# Patient Record
Sex: Female | Born: 1950
Health system: Southern US, Community
[De-identification: ages and names within clinical notes are randomized; demographics above are authoritative.]

## PROBLEM LIST (undated history)

## (undated) DIAGNOSIS — N200 Calculus of kidney: Secondary | ICD-10-CM

## (undated) DIAGNOSIS — T7840XA Allergy, unspecified, initial encounter: Secondary | ICD-10-CM

## (undated) DIAGNOSIS — M8580 Other specified disorders of bone density and structure, unspecified site: Secondary | ICD-10-CM

## (undated) DIAGNOSIS — G4700 Insomnia, unspecified: Secondary | ICD-10-CM

## (undated) DIAGNOSIS — F419 Anxiety disorder, unspecified: Secondary | ICD-10-CM

## (undated) DIAGNOSIS — Z9889 Other specified postprocedural states: Secondary | ICD-10-CM

## (undated) DIAGNOSIS — C4491 Basal cell carcinoma of skin, unspecified: Secondary | ICD-10-CM

## (undated) DIAGNOSIS — K219 Gastro-esophageal reflux disease without esophagitis: Secondary | ICD-10-CM

## (undated) DIAGNOSIS — G4730 Sleep apnea, unspecified: Secondary | ICD-10-CM

## (undated) DIAGNOSIS — N840 Polyp of corpus uteri: Secondary | ICD-10-CM

## (undated) DIAGNOSIS — Z87442 Personal history of urinary calculi: Secondary | ICD-10-CM

## (undated) DIAGNOSIS — E039 Hypothyroidism, unspecified: Secondary | ICD-10-CM

## (undated) DIAGNOSIS — E7800 Pure hypercholesterolemia, unspecified: Secondary | ICD-10-CM

## (undated) DIAGNOSIS — Z780 Asymptomatic menopausal state: Secondary | ICD-10-CM

## (undated) DIAGNOSIS — F988 Other specified behavioral and emotional disorders with onset usually occurring in childhood and adolescence: Secondary | ICD-10-CM

## (undated) DIAGNOSIS — M47812 Spondylosis without myelopathy or radiculopathy, cervical region: Secondary | ICD-10-CM

## (undated) DIAGNOSIS — R112 Nausea with vomiting, unspecified: Secondary | ICD-10-CM

## (undated) HISTORY — DX: Hypothyroidism, unspecified: E03.9

## (undated) HISTORY — DX: Spondylosis without myelopathy or radiculopathy, cervical region: M47.812

## (undated) HISTORY — DX: Calculus of kidney: N20.0

## (undated) HISTORY — DX: Pure hypercholesterolemia, unspecified: E78.00

## (undated) HISTORY — PX: CATARACT EXTRACTION: SUR2

## (undated) HISTORY — PX: COLONOSCOPY: SHX174

## (undated) HISTORY — DX: Other specified behavioral and emotional disorders with onset usually occurring in childhood and adolescence: F98.8

## (undated) HISTORY — DX: Polyp of corpus uteri: N84.0

## (undated) HISTORY — DX: Insomnia, unspecified: G47.00

## (undated) HISTORY — PX: TONSILLECTOMY AND ADENOIDECTOMY: SHX28

## (undated) HISTORY — DX: Basal cell carcinoma of skin, unspecified: C44.91

## (undated) HISTORY — DX: Asymptomatic menopausal state: Z78.0

## (undated) HISTORY — DX: Allergy, unspecified, initial encounter: T78.40XA

## (undated) HISTORY — DX: Other specified disorders of bone density and structure, unspecified site: M85.80

## (undated) HISTORY — DX: Personal history of urinary calculi: Z87.442

---

## 1989-07-02 HISTORY — PX: TUBAL LIGATION: SHX77

## 1991-07-03 HISTORY — PX: HYMENECTOMY: SHX987

## 1999-08-21 ENCOUNTER — Other Ambulatory Visit: Admission: RE | Admit: 1999-08-21 | Discharge: 1999-08-21 | Payer: Self-pay | Admitting: Obstetrics and Gynecology

## 2000-07-10 ENCOUNTER — Ambulatory Visit (HOSPITAL_COMMUNITY)
Admission: RE | Admit: 2000-07-10 | Discharge: 2000-07-10 | Payer: Self-pay | Admitting: Orthodontics and Dentofacial Orthopedics

## 2000-07-10 ENCOUNTER — Encounter: Payer: Self-pay | Admitting: Orthodontics and Dentofacial Orthopedics

## 2000-08-27 ENCOUNTER — Other Ambulatory Visit: Admission: RE | Admit: 2000-08-27 | Discharge: 2000-08-27 | Payer: Self-pay | Admitting: Obstetrics and Gynecology

## 2001-08-02 LAB — HM COLONOSCOPY

## 2001-08-13 ENCOUNTER — Ambulatory Visit (HOSPITAL_COMMUNITY): Admission: RE | Admit: 2001-08-13 | Discharge: 2001-08-13 | Payer: Self-pay | Admitting: *Deleted

## 2001-09-10 ENCOUNTER — Encounter: Payer: Self-pay | Admitting: *Deleted

## 2001-09-10 ENCOUNTER — Encounter: Admission: RE | Admit: 2001-09-10 | Discharge: 2001-09-10 | Payer: Self-pay | Admitting: *Deleted

## 2002-01-06 ENCOUNTER — Other Ambulatory Visit: Admission: RE | Admit: 2002-01-06 | Discharge: 2002-01-06 | Payer: Self-pay | Admitting: Obstetrics and Gynecology

## 2002-03-24 ENCOUNTER — Encounter: Admission: RE | Admit: 2002-03-24 | Discharge: 2002-03-24 | Payer: Self-pay

## 2003-02-01 ENCOUNTER — Other Ambulatory Visit: Admission: RE | Admit: 2003-02-01 | Discharge: 2003-02-01 | Payer: Self-pay | Admitting: Obstetrics and Gynecology

## 2003-12-28 ENCOUNTER — Encounter: Admission: RE | Admit: 2003-12-28 | Discharge: 2003-12-28 | Payer: Self-pay | Admitting: Family Medicine

## 2005-02-22 ENCOUNTER — Encounter: Admission: RE | Admit: 2005-02-22 | Discharge: 2005-02-22 | Payer: Self-pay | Admitting: Family Medicine

## 2009-11-30 LAB — HM DEXA SCAN

## 2010-02-20 LAB — HM MAMMOGRAPHY: HM Mammogram: NEGATIVE

## 2010-10-04 ENCOUNTER — Institutional Professional Consult (permissible substitution) (INDEPENDENT_AMBULATORY_CARE_PROVIDER_SITE_OTHER): Payer: BC Managed Care – PPO | Admitting: Family Medicine

## 2010-10-04 DIAGNOSIS — F988 Other specified behavioral and emotional disorders with onset usually occurring in childhood and adolescence: Secondary | ICD-10-CM

## 2010-10-04 DIAGNOSIS — J309 Allergic rhinitis, unspecified: Secondary | ICD-10-CM

## 2010-12-18 ENCOUNTER — Telehealth: Payer: Self-pay | Admitting: Family Medicine

## 2010-12-18 MED ORDER — AMPHETAMINE-DEXTROAMPHET ER 20 MG PO CP24: 20.0000 mg | ORAL_CAPSULE | ORAL | Status: DC

## 2010-12-18 NOTE — Telephone Encounter (Signed)
Pls mail.

## 2010-12-18 NOTE — Telephone Encounter (Signed)
Addended byJoselyn Arrow on: 12/18/2010 05:04 PM   Modules accepted: Orders

## 2010-12-18 NOTE — Telephone Encounter (Signed)
Chart reviewed--last sent 5/15.  Please mail printed Rx

## 2010-12-18 NOTE — Telephone Encounter (Signed)
Left message informing pt that i placed adderall rx in the mail.

## 2010-12-31 LAB — HM PAP SMEAR

## 2011-01-17 ENCOUNTER — Telehealth: Payer: Self-pay | Admitting: Family Medicine

## 2011-01-17 MED ORDER — AMPHETAMINE-DEXTROAMPHET ER 20 MG PO CP24: 20.0000 mg | ORAL_CAPSULE | ORAL | Status: DC

## 2011-01-17 NOTE — Telephone Encounter (Signed)
Rx printed and signed--please verify that it is for Adderall, NOT ritalin. Thanks

## 2011-01-18 NOTE — Telephone Encounter (Signed)
HANDLE BY V

## 2011-01-18 NOTE — Telephone Encounter (Signed)
Mailed rx for adderall to patient.

## 2011-02-15 ENCOUNTER — Telehealth: Payer: Self-pay | Admitting: Family Medicine

## 2011-02-15 MED ORDER — AMPHETAMINE-DEXTROAMPHET ER 20 MG PO CP24: 20.0000 mg | ORAL_CAPSULE | ORAL | Status: DC

## 2011-02-15 NOTE — Telephone Encounter (Signed)
Printed/signed.  Please mail

## 2011-03-21 ENCOUNTER — Other Ambulatory Visit: Payer: Self-pay | Admitting: Family Medicine

## 2011-03-21 DIAGNOSIS — F988 Other specified behavioral and emotional disorders with onset usually occurring in childhood and adolescence: Secondary | ICD-10-CM

## 2011-03-21 MED ORDER — AMPHETAMINE-DEXTROAMPHET ER 20 MG PO CP24: 20.0000 mg | ORAL_CAPSULE | ORAL | Status: DC

## 2011-03-21 NOTE — Progress Notes (Signed)
Will need 6 month OV scheduled for before further refills.  Due in October.  Please mail Rx to pt per her request.

## 2011-04-20 ENCOUNTER — Encounter: Payer: Self-pay | Admitting: Family Medicine

## 2011-04-23 ENCOUNTER — Ambulatory Visit (INDEPENDENT_AMBULATORY_CARE_PROVIDER_SITE_OTHER): Payer: BC Managed Care – PPO | Admitting: Family Medicine

## 2011-04-23 ENCOUNTER — Encounter: Payer: Self-pay | Admitting: Family Medicine

## 2011-04-23 VITALS — BP 100/64 | HR 76 | Ht 59.0 in | Wt 104.0 lb

## 2011-04-23 DIAGNOSIS — E7800 Pure hypercholesterolemia, unspecified: Secondary | ICD-10-CM

## 2011-04-23 DIAGNOSIS — Z0000 Encounter for general adult medical examination without abnormal findings: Secondary | ICD-10-CM

## 2011-04-23 DIAGNOSIS — Z23 Encounter for immunization: Secondary | ICD-10-CM

## 2011-04-23 DIAGNOSIS — F988 Other specified behavioral and emotional disorders with onset usually occurring in childhood and adolescence: Secondary | ICD-10-CM

## 2011-04-23 DIAGNOSIS — R319 Hematuria, unspecified: Secondary | ICD-10-CM

## 2011-04-23 LAB — POCT URINALYSIS DIPSTICK
Glucose, UA: NEGATIVE
Nitrite, UA: NEGATIVE
Urobilinogen, UA: NEGATIVE
pH, UA: 5

## 2011-04-23 LAB — POCT UA - MICROSCOPIC ONLY

## 2011-04-23 MED ORDER — AMPHETAMINE-DEXTROAMPHET ER 20 MG PO CP24: 20.0000 mg | ORAL_CAPSULE | ORAL | Status: DC

## 2011-04-23 NOTE — Progress Notes (Signed)
Nicole Allen is a 60 y.o. female who presents for a complete physical.  She has the following concerns: Needs refill on her Adderall.  She has been taking it less frequently now that she is retired, not needing it every day, but can definitely tell when she isn't taking it. Hyperlipidemia follow-up.  Compliant with meds, denies side effects.  Due for labs today.  Immunization History  Administered Date(s) Administered  . Tdap 12/26/2005   Last Pap smear: 12/2010 Last mammogram: due now Last colonoscopy: 2/03 (Dr. Loreta Ave). Old record said repeat due 2008; pt recalls being told 10 yrs.  Colonoscopy report isn't in old records Last DEXA: every 2 years, last 11/2009 (through GYN's office) Ophtho: 3 weeks ago Dentist: every 6 months Exercise: 3-5 times/week ([facility]  Past Medical History  Diagnosis Date  . Allergy     SEASONAL  . ADD (attention deficit disorder)   . Postmenopausal     on HRT--sees GYN  . Kidney stone     x3, Dr. Wanda Plump  . Pure hypercholesterolemia   . Osteopenia   . BCC (basal cell carcinoma of skin)     chest; Dr. Danella Deis  . Cervical spondylosis   . Insomnia     worse with menopause  . Hearing loss     high frequency (Dr. Lenard Forth)    Past Surgical History  Procedure Date  . Tonsillectomy and adenoidectomy   . Hymenectomy 1993  . Cesarean section [id number]  . Tubal ligation 1991    History   Social History  . Marital Status: Married    Spouse Name: N/A    Number of Children: 2  . Years of Education: N/A   Occupational History  . retired Social research officer, government    Social History Main Topics  . Smoking status: Never Smoker   . Smokeless tobacco: Never Used  . Alcohol Use: Yes     3-5 drinks per week.  . Drug Use: No  . Sexually Active: Yes -- Female partner(s)   Other Topics Concern  . Not on file   Social History Narrative   Lives with husband.  Son in [place], [place] in [place], Daughter in college    Family History  Problem Relation  Age of Onset  . Asthma Mother   . Arthritis Mother   . Hypertension Mother   . Hyperlipidemia Mother   . Hypertension Father   . Heart disease Father     78's  . Hyperlipidemia Father   . Alzheimer's disease Maternal Grandmother   . Cancer Maternal Grandfather     lung  . Cancer Paternal Grandfather     lung    Current outpatient prescriptions:amphetamine-dextroamphetamine (ADDERALL XR) 20 MG 24 hr capsule, Take 1 capsule (20 mg total) by mouth every morning., Disp: 30 capsule, Rfl: 0;  Calcium Carbonate-Vitamin D (CALCIUM + D PO), Take 1 tablet by mouth daily.  , Disp: , Rfl: ;  fluticasone (FLONASE) 50 MCG/ACT nasal spray, Place 2 sprays into the nose daily.  , Disp: , Rfl:  loratadine (CLARITIN) 10 MG tablet, Take 10 mg by mouth daily.  , Disp: , Rfl: ;  Multiple Vitamins-Minerals (MULTIVITAMIN WITH MINERALS) tablet, Take 1 tablet by mouth daily.  , Disp: , Rfl: ;  progesterone (PROMETRIUM) 100 MG capsule, Take 100 mg by mouth daily.  , Disp: , Rfl: ;  simvastatin (ZOCOR) 10 MG tablet, Take 10 mg by mouth at bedtime.  , Disp: , Rfl: ;  VAGIFEM 10 MCG TABS,  Place 1 tablet vaginally 2 (two) times a week. , Disp: , Rfl:  VIVELLE-DOT 0.025 MG/24HR, Place 1 patch onto the skin once a week. , Disp: , Rfl: ;  amphetamine-dextroamphetamine (ADDERALL XR) 20 MG 24 hr capsule, Take 1 capsule (20 mg total) by mouth every morning., Disp: 30 capsule, Rfl: 0  Allergies  Allergen Reactions  . Morphine And Related Other (See Comments)    Family history.  . Codeine Itching and Rash   ROS: The patient denies anorexia, fever, weight changes, headaches,  vision changes, decreased hearing, ear pain, sore throat, breast concerns, chest pain, palpitations, dizziness, syncope, dyspnea on exertion, cough, swelling, nausea, vomiting, diarrhea, constipation, abdominal pain, melena, hematochezia, indigestion/heartburn, hematuria, incontinence, dysuria, vaginal bleeding, discharge, odor or itch, genital lesions,  joint pains, numbness, tingling, weakness, tremor, suspicious skin lesions, depression, anxiety, abnormal bleeding/bruising, or enlarged lymph nodes.  +R knee and L shoulder mild pain, mild  PHYSICAL EXAM: BP 100/64  Pulse 76  Ht 4\' 11"  (1.499 m)  Wt 104 lb (47.174 kg)  BMI 21.01 kg/m2  General Appearance:    Alert, cooperative, no distress, appears stated age  Head:    Normocephalic, without obvious abnormality, atraumatic  Eyes:    PERRL, conjunctiva/corneas clear, EOM's intact, fundi    benign  Ears:    Normal TM's and external ear canals  Nose:   Nares normal, mucosa normal, no drainage or sinus   tenderness  Throat:   Lips, mucosa, and tongue normal; teeth and gums normal. Cobblestoning posteriorly  Neck:   Supple, no lymphadenopathy;  thyroid:  no   enlargement/tenderness/nodules; no carotid   bruit or JVD  Back:    Spine nontender, no curvature, ROM normal, no CVA     tenderness  Lungs:     Clear to auscultation bilaterally without wheezes, rales or     ronchi; respirations unlabored  Chest Wall:    No tenderness or deformity   Heart:    Regular rate and rhythm, S1 and S2 normal, no murmur, rub   or gallop  Breast Exam:    Deferred to GYN  Abdomen:     Soft, non-tender, nondistended, normoactive bowel sounds,    no masses, no hepatosplenomegaly  Genitalia:    Deferred to GYN     Extremities:   No clubbing, cyanosis or edema  Pulses:   2+ and symmetric all extremities  Skin:   Skin color, texture, turgor normal, no rashes or lesions. Very tanned. No suspicious lesions noted  Lymph nodes:   Cervical, supraclavicular, and axillary nodes normal  Neurologic:   CNII-XII intact, normal strength, sensation and gait; reflexes 2+ and symmetric throughout          Psych:   Normal mood, affect, hygiene and grooming.     ASSESSMENT/PLAN: 1. Routine general medical examination at a health care facility  POCT Urinalysis Dipstick  2. Pure hypercholesterolemia  Lipid panel, Hepatic  function panel  3. Need for influenza vaccination  Flu vaccine greater than or equal to 3yo preservative free IM  4. ADD (attention deficit disorder)  amphetamine-dextroamphetamine (ADDERALL XR) 20 MG 24 hr capsule  5. Hematuria  Urine Culture, POCT UA - Microscopic Only   2+ blood in urine.  Micro looks like contaminated specimen.  Didn't have enough urine to send for culture--will see if she can give another specimen.  Pt denies history of blood in urine on routine dipsticks.  She does have a history of kidney stones, but has been asymptomatic.  No symptoms of infection.  Patient has been taking lower than the recommended/rx'd dose of her HRT--she will increase HRT to as directed (twice weekly) and re-check in 2-3 weeks (in case atrophic changes are contributing). If persistent hematuria (and no infection), will have her follow up with Dr. Wanda Plump for re-eval. . Discussed monthly self breast exams and yearly mammograms; at least 30 minutes of aerobic activity at least 5 days/week; proper sunscreen use reviewed; healthy diet, including goals of calcium and vitamin D intake and alcohol recommendations (less than or equal to 1 drink/day) reviewed; regular seatbelt use; changing batteries in smoke detectors.  Immunization recommendations discussed--flu shot given. Pneumovax age 47. Shingles vaccine reviewed--will check insurance. Risks/side effects reviewed.  Colonoscopy recommendations reviewed--due next year (although some ? In chart if was due in 2008, pt never contacted by Dr. Kenna Gilbert office).  Hemoccult kit given.

## 2011-04-23 NOTE — Patient Instructions (Addendum)
HEALTH MAINTENANCE RECOMMENDATIONS:  It is recommended that you get at least 30 minutes of aerobic exercise at least 5 days/week (for weight loss, you may need as much as 60-90 minutes). This can be any activity that gets your heart rate up. This can be divided in 10-15 minute intervals if needed, but try and build up your endurance at least once a week.  Weight bearing exercise is also recommended twice weekly.  Eat a healthy diet with lots of vegetables, fruits and fiber.  "Colorful" foods have a lot of vitamins (ie green vegetables, tomatoes, red peppers, etc).  Limit sweet tea, regular sodas and alcoholic beverages, all of which has a lot of calories and sugar.  Up to 1 alcoholic drink daily may be beneficial for women (unless trying to lose weight, watch sugars).  Drink a lot of water.  Calcium recommendations are 1200-1500 mg daily (1500 mg for postmenopausal women or women without ovaries), and vitamin D 1000 IU daily.  This should be obtained from diet and/or supplements (vitamins), and calcium should not be taken all at once, but in divided doses.  Monthly self breast exams and yearly mammograms for women over the age of 73 is recommended.  Sunscreen of at least SPF 30 should be used on all sun-exposed parts of the skin when outside between the hours of 10 am and 4 pm (not just when at beach or pool, but even with exercise, golf, tennis, and yard work!)  Use a sunscreen that says "broad spectrum" so it covers both UVA and UVB rays, and make sure to reapply every 1-2 hours.  Remember to change the batteries in your smoke detectors when changing your clock times in the spring and fall.  Use your seat belt every time you are in a car, and please drive safely and not be distracted with cell phones and texting while driving.  Please check with your insurance regarding shingles vaccine coverage (Zostavax).  You may return at your [facility] as a nurse visit or we can do in 6 months at  your next visit

## 2011-04-24 ENCOUNTER — Encounter: Payer: Self-pay | Admitting: Family Medicine

## 2011-04-24 DIAGNOSIS — E7800 Pure hypercholesterolemia, unspecified: Secondary | ICD-10-CM | POA: Insufficient documentation

## 2011-04-24 DIAGNOSIS — F988 Other specified behavioral and emotional disorders with onset usually occurring in childhood and adolescence: Secondary | ICD-10-CM | POA: Insufficient documentation

## 2011-04-24 LAB — HEPATIC FUNCTION PANEL
Alkaline Phosphatase: 63 U/L (ref 39–117)
Indirect Bilirubin: 0.4 mg/dL (ref 0.0–0.9)
Total Bilirubin: 0.5 mg/dL (ref 0.3–1.2)

## 2011-04-26 LAB — LIPID PANEL: LDL Cholesterol: 102 mg/dL — ABNORMAL HIGH (ref 0–99)

## 2011-04-27 LAB — URINE CULTURE

## 2011-05-14 ENCOUNTER — Other Ambulatory Visit (INDEPENDENT_AMBULATORY_CARE_PROVIDER_SITE_OTHER): Payer: BC Managed Care – PPO

## 2011-05-14 DIAGNOSIS — R319 Hematuria, unspecified: Secondary | ICD-10-CM

## 2011-05-14 DIAGNOSIS — Z2911 Encounter for prophylactic immunotherapy for respiratory syncytial virus (RSV): Secondary | ICD-10-CM

## 2011-05-14 DIAGNOSIS — Z23 Encounter for immunization: Secondary | ICD-10-CM

## 2011-05-14 LAB — POCT URINALYSIS DIPSTICK
Glucose, UA: NEGATIVE
Nitrite, UA: NEGATIVE
Spec Grav, UA: 1.02
Urobilinogen, UA: NEGATIVE

## 2011-05-21 ENCOUNTER — Telehealth: Payer: Self-pay | Admitting: *Deleted

## 2011-05-21 DIAGNOSIS — F988 Other specified behavioral and emotional disorders with onset usually occurring in childhood and adolescence: Secondary | ICD-10-CM

## 2011-05-21 MED ORDER — AMPHETAMINE-DEXTROAMPHET ER 20 MG PO CP24: 20.0000 mg | ORAL_CAPSULE | ORAL | Status: DC

## 2011-05-21 NOTE — Telephone Encounter (Signed)
Patient would like to pick up her Adderall tomorrow to fill on Wednesday. Is this ok? Thanks.

## 2011-05-21 NOTE — Telephone Encounter (Signed)
Yes printed

## 2011-06-08 ENCOUNTER — Telehealth: Payer: Self-pay | Admitting: Family Medicine

## 2011-06-08 NOTE — Telephone Encounter (Signed)
DONE

## 2011-06-20 ENCOUNTER — Telehealth: Payer: Self-pay | Admitting: Internal Medicine

## 2011-06-20 DIAGNOSIS — F988 Other specified behavioral and emotional disorders with onset usually occurring in childhood and adolescence: Secondary | ICD-10-CM

## 2011-06-20 MED ORDER — AMPHETAMINE-DEXTROAMPHET ER 20 MG PO CP24: 20.0000 mg | ORAL_CAPSULE | ORAL | Status: DC

## 2011-06-20 NOTE — Telephone Encounter (Signed)
Okay to print rx for refill

## 2011-06-21 ENCOUNTER — Telehealth: Payer: Self-pay | Admitting: *Deleted

## 2011-06-21 NOTE — Telephone Encounter (Signed)
Left message for patient letting her know that her rx is ready for pickup.

## 2011-07-11 ENCOUNTER — Telehealth: Payer: Self-pay | Admitting: Internal Medicine

## 2011-07-11 MED ORDER — FLUTICASONE PROPIONATE 50 MCG/ACT NA SUSP: 2.0000 | Freq: Every day | NASAL | Status: DC

## 2011-07-11 NOTE — Telephone Encounter (Signed)
E-rx'd flonase

## 2011-07-19 ENCOUNTER — Telehealth: Payer: Self-pay | Admitting: Family Medicine

## 2011-07-19 DIAGNOSIS — F988 Other specified behavioral and emotional disorders with onset usually occurring in childhood and adolescence: Secondary | ICD-10-CM

## 2011-07-19 MED ORDER — AMPHETAMINE-DEXTROAMPHET ER 20 MG PO CP24: 20.0000 mg | ORAL_CAPSULE | ORAL | Status: DC

## 2011-07-19 NOTE — Telephone Encounter (Signed)
Printed/signed.  Advise pt

## 2011-07-19 NOTE — Telephone Encounter (Signed)
Left message informing her that her rx is ready for pickup.

## 2011-07-25 ENCOUNTER — Other Ambulatory Visit: Payer: Self-pay | Admitting: Family Medicine

## 2011-08-21 ENCOUNTER — Other Ambulatory Visit: Payer: Self-pay | Admitting: Family Medicine

## 2011-08-21 DIAGNOSIS — F988 Other specified behavioral and emotional disorders with onset usually occurring in childhood and adolescence: Secondary | ICD-10-CM

## 2011-08-21 NOTE — Telephone Encounter (Signed)
Ok for rx.  Please print for me to sign

## 2011-08-22 ENCOUNTER — Other Ambulatory Visit: Payer: Self-pay

## 2011-08-22 MED ORDER — AMPHETAMINE-DEXTROAMPHET ER 20 MG PO CP24: 20.0000 mg | ORAL_CAPSULE | ORAL | Status: DC

## 2011-09-17 ENCOUNTER — Telehealth: Payer: Self-pay | Admitting: Family Medicine

## 2011-09-17 DIAGNOSIS — F988 Other specified behavioral and emotional disorders with onset usually occurring in childhood and adolescence: Secondary | ICD-10-CM

## 2011-09-17 MED ORDER — AMPHETAMINE-DEXTROAMPHET ER 20 MG PO CP24: 20.0000 mg | ORAL_CAPSULE | ORAL | Status: DC

## 2011-09-17 NOTE — Telephone Encounter (Signed)
Printed / signed

## 2011-09-21 ENCOUNTER — Encounter: Payer: Self-pay | Admitting: Internal Medicine

## 2011-10-22 ENCOUNTER — Telehealth: Payer: Self-pay | Admitting: Internal Medicine

## 2011-10-22 DIAGNOSIS — F988 Other specified behavioral and emotional disorders with onset usually occurring in childhood and adolescence: Secondary | ICD-10-CM

## 2011-10-22 MED ORDER — AMPHETAMINE-DEXTROAMPHET ER 20 MG PO CP24: 20.0000 mg | ORAL_CAPSULE | ORAL | Status: DC

## 2011-10-22 NOTE — Telephone Encounter (Signed)
Lmom notifying the patient that a RX is up front for her to pick but it is only for 30 days with no refills. She was also told that she needs to schedule a F/U visit with Dr. Lynelle Doctor. CLS

## 2011-10-22 NOTE — Telephone Encounter (Signed)
She is due for fasting med check (f/u lipids and ADD).  No appointment is scheduled.  Please schedule appt.  Only 30 day given--will give 90 day (if her insurance allows) at her visit. Rx was printed

## 2011-10-31 ENCOUNTER — Ambulatory Visit (INDEPENDENT_AMBULATORY_CARE_PROVIDER_SITE_OTHER): Payer: BC Managed Care – PPO | Admitting: Family Medicine

## 2011-10-31 ENCOUNTER — Encounter: Payer: Self-pay | Admitting: Family Medicine

## 2011-10-31 ENCOUNTER — Encounter: Payer: Self-pay | Admitting: Cardiovascular Disease

## 2011-10-31 VITALS — BP 118/64 | HR 64 | Ht 59.0 in | Wt 104.0 lb

## 2011-10-31 DIAGNOSIS — R300 Dysuria: Secondary | ICD-10-CM

## 2011-10-31 DIAGNOSIS — Z79899 Other long term (current) drug therapy: Secondary | ICD-10-CM

## 2011-10-31 DIAGNOSIS — E7800 Pure hypercholesterolemia, unspecified: Secondary | ICD-10-CM

## 2011-10-31 DIAGNOSIS — F988 Other specified behavioral and emotional disorders with onset usually occurring in childhood and adolescence: Secondary | ICD-10-CM

## 2011-10-31 LAB — POCT URINALYSIS DIPSTICK
Bilirubin, UA: NEGATIVE
Ketones, UA: NEGATIVE
Leukocytes, UA: NEGATIVE
Protein, UA: NEGATIVE
Spec Grav, UA: 1.01
pH, UA: 5

## 2011-10-31 NOTE — Patient Instructions (Signed)
We will notify you of your cholesterol results.  If LDL remains low, we will let you know if you can try stopping the simvastatin.  Goal LDL is <130.  Your HDL has been excellent in the past.  If you stop the medication, then we will need to repeat your lipids in 3-4 months to ensure that you are still at goal.  Check into your insurance to see if you can get a 90 day prescription filled--look into this before your next refill is due, so I know how to write the next prescription.

## 2011-10-31 NOTE — Progress Notes (Signed)
Chief complaint: Med check for ADD meds and is fasting for lipids if needed. Also wanted to recheck UA. She was instructed to follow up with urologist but did not. States that she still feels some pressure occasionally  HPI:  ADD: Takes medication most days, randomly will skip taking it, and her husband usually notices it.  She finds herself moving constantly, shaking her leg, and she annoys herself with this movement if she has forgotten to take it.  Denies side effects--trouble sleeping her whole life, not related to meds. Good appetite and denies weight change.  Hematuria: She was noted to have 2+ blood on urine at her physical.  Had persistent hematuria on recheck and was recommended to see urologist, but never did.  Denies any specific urinary symptoms--just occasional urgency.  Hyperlipidemia:  Lipids were last checked in October. Compliant with medications and denies side effects. She would like to see if she might be able to come off of her simvastatin.  Feels like her diet has been better since retiring.  She has been enjoying traveling, and girls weekends.  Just got back from a girls trip--admits to drinking more alcohol than is typical for her.  Allergies are controlled with her current medical regimen.  Past Medical History  Diagnosis Date  . Allergy     SEASONAL  . ADD (attention deficit disorder)   . Postmenopausal     on HRT--sees GYN  . Kidney stone     x3, Dr. Wanda Plump  . Pure hypercholesterolemia   . Osteopenia   . BCC (basal cell carcinoma of skin)     chest; Dr. Danella Deis  . Cervical spondylosis   . Insomnia     worse with menopause  . Hearing loss     high frequency (Dr. Lenard Forth)   Past Surgical History  Procedure Date  . Tonsillectomy and adenoidectomy   . Hymenectomy 1993  . Cesarean section [id number]  . Tubal ligation 1991   History   Social History  . Marital Status: Married    Spouse Name: N/A    Number of Children: 2  . Years of Education: N/A    Occupational History  . retired Social research officer, government    Social History Main Topics  . Smoking status: Never Smoker   . Smokeless tobacco: Never Used  . Alcohol Use: Yes     3-5 drinks per week.  . Drug Use: No  . Sexually Active: Yes -- Female partner(s)   Other Topics Concern  . Not on file   Social History Narrative   Lives with husband.  Son in [place], [place] in [place], Daughter in college   Current Outpatient Prescriptions on File Prior to Visit  Medication Sig Dispense Refill  . amphetamine-dextroamphetamine (ADDERALL XR) 20 MG 24 hr capsule Take 1 capsule (20 mg total) by mouth every morning.  30 capsule  0  . Calcium Carbonate-Vitamin D (CALCIUM + D PO) Take 1 tablet by mouth daily.        . fluticasone (FLONASE) 50 MCG/ACT nasal spray Place 2 sprays into the nose daily.  16 g  5  . loratadine (CLARITIN) 10 MG tablet Take 10 mg by mouth daily.        . Multiple Vitamins-Minerals (MULTIVITAMIN WITH MINERALS) tablet Take 1 tablet by mouth daily.        Marland Kitchen VAGIFEM 10 MCG TABS Place 1 tablet vaginally 2 (two) times a week.       Marland Kitchen DISCONTD: ZOCOR 10 MG tablet TAKE ONE  TABLET AT BEDTIME.  30 each  2  . progesterone (PROMETRIUM) 100 MG capsule Take 100 mg by mouth daily.        Marland Kitchen VIVELLE-DOT 0.025 MG/24HR Place 1 patch onto the skin once a week.       She hasn't been taking the prometrium or vivelle, just the vagifem.  Allergies  Allergen Reactions  . Morphine And Related Other (See Comments)    Family history.  . Codeine Itching and Rash   ROS:  Denies fevers, URI symptoms, shortness of breath, chest pain, palpitations, nausea, vomiting, heartburn, bowel changes.  Denies dysuria, incontinence, skin rash, mood changes, or other concerns.  Denies myalgias, joint pains.  PHYSICAL EXAM: BP 118/64  Pulse 64  Ht 4\' 11"  (1.499 m)  Wt 104 lb (47.174 kg)  BMI 21.01 kg/m2 Well developed, pleasant female in no distress Neck: no lymphadenopathy, thyromegaly or mass Heart: regular  rate and rhythm without murmur Lungs: clear bilaterally Abdomen: soft, nontender, no organomegaly or mass Extremities: no edema Skin: no rash Psych: normal mood, affect, hygiene grooming.  Good concentration, no excessive movements, normal speech. Neuro: alert and oriented, normal gait, cranial nerves grossly intact.  ASSESSMENT/PLAN: 1. Dysuria  POCT Urinalysis Dipstick  2. ADD (attention deficit disorder)    3. Pure hypercholesterolemia  Lipid panel  4. Encounter for long-term (current) use of other medications  Comprehensive metabolic panel   ADD--will check with her insurance if they will pay for rx for #90 (just filled last week #30), vs writing 3 rx's for #30.  Continue current meds.  Urine dip--trace blood.  Hematuria much improved from last check.  Not necessary to f/u with urologist (h/o stones in past).  Hyperlipidemia--pt would like to see if she could come off meds.  Check baseline lipids today.  HDL has been high.  Goal LDL <130.  We will let her know if there is room to consider trying stopping meds and re-checking lipid profile in 3-4 months

## 2011-11-01 ENCOUNTER — Encounter: Payer: Self-pay | Admitting: Family Medicine

## 2011-11-01 LAB — COMPREHENSIVE METABOLIC PANEL
ALT: 16 U/L (ref 0–35)
CO2: 28 mEq/L (ref 19–32)
Calcium: 9.3 mg/dL (ref 8.4–10.5)
Chloride: 99 mEq/L (ref 96–112)
Creat: 0.63 mg/dL (ref 0.50–1.10)
Glucose, Bld: 80 mg/dL (ref 70–99)
Total Bilirubin: 0.3 mg/dL (ref 0.3–1.2)

## 2011-11-01 LAB — LIPID PANEL
Cholesterol: 217 mg/dL — ABNORMAL HIGH (ref 0–200)
Total CHOL/HDL Ratio: 2.7 Ratio
Triglycerides: 67 mg/dL (ref <150)
VLDL: 13 mg/dL (ref 0–40)

## 2011-11-09 ENCOUNTER — Other Ambulatory Visit: Payer: Self-pay | Admitting: Family Medicine

## 2011-11-09 DIAGNOSIS — E7800 Pure hypercholesterolemia, unspecified: Secondary | ICD-10-CM

## 2011-11-09 NOTE — Telephone Encounter (Signed)
done

## 2011-11-09 NOTE — Telephone Encounter (Signed)
Is this ok?

## 2011-11-22 ENCOUNTER — Telehealth: Payer: Self-pay | Admitting: Family Medicine

## 2011-11-22 DIAGNOSIS — F988 Other specified behavioral and emotional disorders with onset usually occurring in childhood and adolescence: Secondary | ICD-10-CM

## 2011-11-22 MED ORDER — AMPHETAMINE-DEXTROAMPHET ER 20 MG PO CP24: 20.0000 mg | ORAL_CAPSULE | ORAL | Status: DC

## 2011-11-22 NOTE — Telephone Encounter (Signed)
Pt requesting Generic Adderall XR  20 mg refill.  She will pick up after 3:00 today.

## 2011-11-22 NOTE — Telephone Encounter (Signed)
LM

## 2011-11-22 NOTE — Telephone Encounter (Signed)
Advise pt ready

## 2011-12-20 ENCOUNTER — Telehealth: Payer: Self-pay | Admitting: Family Medicine

## 2011-12-20 DIAGNOSIS — F988 Other specified behavioral and emotional disorders with onset usually occurring in childhood and adolescence: Secondary | ICD-10-CM

## 2011-12-20 MED ORDER — AMPHETAMINE-DEXTROAMPHET ER 20 MG PO CP24: 20.0000 mg | ORAL_CAPSULE | ORAL | Status: DC

## 2011-12-20 NOTE — Telephone Encounter (Signed)
Advise pt rx signed/ready for pickup Friday

## 2011-12-21 NOTE — Telephone Encounter (Signed)
ADVISED PT THAT SCRIPT READY FOR PICKUP

## 2012-03-17 ENCOUNTER — Telehealth: Payer: Self-pay | Admitting: Family Medicine

## 2012-03-17 DIAGNOSIS — F988 Other specified behavioral and emotional disorders with onset usually occurring in childhood and adolescence: Secondary | ICD-10-CM

## 2012-03-17 MED ORDER — AMPHETAMINE-DEXTROAMPHET ER 20 MG PO CP24: 20.0000 mg | ORAL_CAPSULE | ORAL | Status: DC

## 2012-03-17 NOTE — Telephone Encounter (Signed)
LM

## 2012-03-17 NOTE — Telephone Encounter (Signed)
Printed/signed.  Advise pt ready 

## 2012-04-28 ENCOUNTER — Other Ambulatory Visit: Payer: Self-pay | Admitting: Family Medicine

## 2012-05-23 ENCOUNTER — Telehealth: Payer: Self-pay | Admitting: Family Medicine

## 2012-05-26 ENCOUNTER — Telehealth: Payer: Self-pay | Admitting: *Deleted

## 2012-05-26 ENCOUNTER — Other Ambulatory Visit: Payer: Self-pay | Admitting: *Deleted

## 2012-05-26 ENCOUNTER — Other Ambulatory Visit: Payer: BC Managed Care – PPO

## 2012-05-26 DIAGNOSIS — Z79899 Other long term (current) drug therapy: Secondary | ICD-10-CM

## 2012-05-26 NOTE — Telephone Encounter (Signed)
Future orders entered.Left message for patient to return my call and schedule CPE and pre-lab appt.

## 2012-05-26 NOTE — Telephone Encounter (Signed)
Ok for c-met, lipids, CBC and TSH.  Dx v58.69.  Please enter future orders and schedule

## 2012-05-27 ENCOUNTER — Other Ambulatory Visit: Payer: Self-pay | Admitting: Family Medicine

## 2012-06-06 ENCOUNTER — Telehealth: Payer: Self-pay | Admitting: Family Medicine

## 2012-06-06 NOTE — Telephone Encounter (Signed)
LM

## 2012-06-09 ENCOUNTER — Other Ambulatory Visit (INDEPENDENT_AMBULATORY_CARE_PROVIDER_SITE_OTHER): Payer: BC Managed Care – PPO

## 2012-06-09 DIAGNOSIS — Z23 Encounter for immunization: Secondary | ICD-10-CM

## 2012-06-16 ENCOUNTER — Other Ambulatory Visit: Payer: Self-pay | Admitting: Family Medicine

## 2012-06-17 ENCOUNTER — Telehealth: Payer: Self-pay | Admitting: Internal Medicine

## 2012-06-17 DIAGNOSIS — F988 Other specified behavioral and emotional disorders with onset usually occurring in childhood and adolescence: Secondary | ICD-10-CM

## 2012-06-18 MED ORDER — AMPHETAMINE-DEXTROAMPHET ER 20 MG PO CP24: 20.0000 mg | ORAL_CAPSULE | ORAL | Status: DC

## 2012-06-18 NOTE — Telephone Encounter (Signed)
Printed / signed

## 2012-07-03 ENCOUNTER — Other Ambulatory Visit: Payer: Self-pay | Admitting: Family Medicine

## 2012-07-10 ENCOUNTER — Encounter: Payer: Self-pay | Admitting: Internal Medicine

## 2012-07-21 ENCOUNTER — Other Ambulatory Visit: Payer: BC Managed Care – PPO

## 2012-07-21 DIAGNOSIS — Z79899 Other long term (current) drug therapy: Secondary | ICD-10-CM

## 2012-07-21 LAB — CBC WITH DIFFERENTIAL/PLATELET
Basophils Relative: 2 % — ABNORMAL HIGH (ref 0–1)
Eosinophils Absolute: 0.2 10*3/uL (ref 0.0–0.7)
HCT: 35.5 % — ABNORMAL LOW (ref 36.0–46.0)
Hemoglobin: 12.4 g/dL (ref 12.0–15.0)
Lymphs Abs: 1.9 10*3/uL (ref 0.7–4.0)
MCH: 29.7 pg (ref 26.0–34.0)
MCHC: 34.9 g/dL (ref 30.0–36.0)
Monocytes Absolute: 0.4 10*3/uL (ref 0.1–1.0)
Monocytes Relative: 8 % (ref 3–12)
Neutro Abs: 2 10*3/uL (ref 1.7–7.7)
Neutrophils Relative %: 44 % (ref 43–77)
RBC: 4.17 MIL/uL (ref 3.87–5.11)

## 2012-07-21 LAB — LIPID PANEL
Cholesterol: 222 mg/dL — ABNORMAL HIGH (ref 0–200)
Triglycerides: 79 mg/dL (ref <150)
VLDL: 16 mg/dL (ref 0–40)

## 2012-07-21 LAB — TSH: TSH: 4.381 u[IU]/mL (ref 0.350–4.500)

## 2012-07-21 LAB — COMPREHENSIVE METABOLIC PANEL
ALT: 14 U/L (ref 0–35)
Albumin: 4 g/dL (ref 3.5–5.2)
CO2: 29 mEq/L (ref 19–32)
Chloride: 104 mEq/L (ref 96–112)
Glucose, Bld: 91 mg/dL (ref 70–99)
Potassium: 4.6 mEq/L (ref 3.5–5.3)
Sodium: 139 mEq/L (ref 135–145)
Total Protein: 6 g/dL (ref 6.0–8.3)

## 2012-07-24 ENCOUNTER — Encounter: Payer: Self-pay | Admitting: Family Medicine

## 2012-07-24 ENCOUNTER — Ambulatory Visit (INDEPENDENT_AMBULATORY_CARE_PROVIDER_SITE_OTHER): Payer: BC Managed Care – PPO | Admitting: Family Medicine

## 2012-07-24 VITALS — BP 122/72 | HR 64 | Ht 59.0 in | Wt 108.0 lb

## 2012-07-24 DIAGNOSIS — Z0000 Encounter for general adult medical examination without abnormal findings: Secondary | ICD-10-CM

## 2012-07-24 DIAGNOSIS — F988 Other specified behavioral and emotional disorders with onset usually occurring in childhood and adolescence: Secondary | ICD-10-CM

## 2012-07-24 DIAGNOSIS — E7800 Pure hypercholesterolemia, unspecified: Secondary | ICD-10-CM

## 2012-07-24 DIAGNOSIS — G4700 Insomnia, unspecified: Secondary | ICD-10-CM

## 2012-07-24 LAB — POCT URINALYSIS DIPSTICK
Bilirubin, UA: NEGATIVE
Glucose, UA: NEGATIVE
Ketones, UA: NEGATIVE
Leukocytes, UA: NEGATIVE
Nitrite, UA: NEGATIVE

## 2012-07-24 MED ORDER — SIMVASTATIN 10 MG PO TABS: 10.0000 mg | ORAL_TABLET | Freq: Every day | ORAL | Status: DC

## 2012-07-24 MED ORDER — TRAZODONE HCL 50 MG PO TABS: 50.0000 mg | ORAL_TABLET | Freq: Every evening | ORAL | Status: DC | PRN

## 2012-07-24 NOTE — Progress Notes (Signed)
Chief Complaint  Patient presents with  . Annual Exam    non fasting CPE, labs already done. No gyn today sees Dr.Fogleman and is up to date. Would like to talk about her insomnia. UA showed trace blood-pt asymptomatic and last years CPE showed same result.   Nicole Allen is a 62 y.o. female who presents for a complete physical.  She has the following concerns:  ADD: takes meds daily.  Effective. No side effects. Doesn't currently need refill.  Insomnia--still has trouble sleeping even if doesn't take ADD meds.  Doesn't have any caffeine after lunch.  Shuts computer down 2 hrs prior to bedtime.  Exercises regularly. Has had sleep issues since she was a child, worse in the last year, much worse since menopause.  Trouble falling asleep.  Benadryl used to be effective, but no longer working. Melatonin not effective either.  Took some of her mother's alprazolam, (1/2 of 0.5mg  tablet) a few times, which was helpful.    Health Maintenance: Immunization History  Administered Date(s) Administered  . Influenza Split 04/23/2011  . Influenza, Seasonal, Injecte, Preservative Fre 06/09/2012  . Tdap 12/26/2005  . Zoster 05/14/2011   Last Pap smear: 12/2011 Last mammogram: 10/2011 Last colonoscopy: 12/2011, small polyp, due again in 5 years Last DEXA: 11/2009, due to schedule again Ophtho: yearly Dentist: every 6 months  Exercise: 3-5 times/week ([facility])  Past Medical History  Diagnosis Date  . Allergy     SEASONAL  . ADD (attention deficit disorder)   . Postmenopausal     on HRT--sees GYN  . Kidney stone     x3, Dr. Wanda Plump  . Pure hypercholesterolemia   . Osteopenia   . BCC (basal cell carcinoma of skin)     chest; Dr. Danella Deis  . Cervical spondylosis   . Insomnia     worse with menopause  . Hearing loss     high frequency (Dr. Lenard Forth)  . Elevated cholesterol   . History of kidney stones     x3  . BCC (basal cell carcinoma)     Danella Deis    Past Surgical History  Procedure  Date  . Tonsillectomy and adenoidectomy   . Hymenectomy 1993  . Tubal ligation 1991  . Cesarean section [id number]  . Colonoscopy 2003, 2013    Dr. Loreta Ave    History   Social History  . Marital Status: Married    Spouse Name: N/A    Number of Children: 2  . Years of Education: N/A   Occupational History  . retired Social research officer, [employer]    Social History Main Topics  . Smoking status: Never Smoker   . Smokeless tobacco: Never Used  . Alcohol Use: Yes     Comment: 3-5 drinks per week.  . Drug Use: No  . Sexually Active: Yes -- Female partner(s)   Other Topics Concern  . Not on file   Social History Narrative   Lives with husband.  Son in [place], [place] in [place], Daughter in [place]    Family History  Problem Relation Age of Onset  . Asthma Mother   . Arthritis Mother   . Hypertension Mother   . Hyperlipidemia Mother   . Hypertension Father   . Heart disease Father     63's  . Hyperlipidemia Father   . Alzheimer's disease Maternal Grandmother   . Cancer Maternal Grandfather     lung  . Cancer Paternal Grandfather     lung  . ADD / ADHD  Daughter   . ADD / ADHD Son     Current outpatient prescriptions:amphetamine-dextroamphetamine (ADDERALL XR) 20 MG 24 hr capsule, Take 1 capsule (20 mg total) by mouth every morning., Disp: 90 capsule, Rfl: 0;  aspirin 81 MG tablet, Take 81 mg by mouth daily., Disp: , Rfl: ;  co-enzyme Q-10 30 MG capsule, Take 30 mg by mouth daily., Disp: , Rfl: ;  ESTRACE VAGINAL 0.1 MG/GM vaginal cream, Place 2 g vaginally 2 (two) times a week. , Disp: , Rfl:  fish oil-omega-3 fatty acids 1000 MG capsule, Take 1 g by mouth 2 (two) times daily., Disp: , Rfl: ;  fluticasone (FLONASE) 50 MCG/ACT nasal spray, USE 2 SPRAYS IN EACH NOSTRIL ONCE A DAY., Disp: 16 g, Rfl: 2;  loratadine (CLARITIN) 10 MG tablet, Take 10 mg by mouth daily.  , Disp: , Rfl: ;  simvastatin (ZOCOR) 10 MG tablet, Take 1 tablet (10 mg total) by mouth at bedtime., Disp: 90 tablet, Rfl:  3 traZODone (DESYREL) 50 MG tablet, Take 1-2 tablets (50-100 mg total) by mouth at bedtime as needed for sleep., Disp: 60 tablet, Rfl: 11  Allergies  Allergen Reactions  . Morphine And Related Other (See Comments)    Family history.  . Codeine Itching and Rash    ROS: The patient denies anorexia, fever, weight changes, headaches, vision changes, decreased hearing, ear pain, sore throat, breast concerns, chest pain, palpitations, dizziness, syncope, dyspnea on exertion, cough, swelling, nausea, vomiting, diarrhea, constipation, abdominal pain, melena, hematochezia, indigestion/heartburn, hematuria, incontinence, dysuria, vaginal bleeding, discharge, odor or itch, genital lesions, joint pains, numbness, tingling, weakness, tremor, suspicious skin lesions, depression, anxiety, abnormal bleeding/bruising, or enlarged lymph nodes.   +R knee and L shoulder mild pain, mild.  Pain improved with pilates and triple biflex  PHYSICAL EXAM BP 122/72  Pulse 64  Ht 4\' 11"  (1.499 m)  Wt 108 lb (48.988 kg)  BMI 21.81 kg/m2  General Appearance:  Alert, cooperative, no distress, appears stated age   Head:  Normocephalic, without obvious abnormality, atraumatic   Eyes:  PERRL, conjunctiva/corneas clear, EOM's intact, fundi  benign   Ears:  Normal TM's and external ear canals   Nose:  Nares normal, mucosa normal, no drainage or sinus tenderness   Throat:  Lips, mucosa, and tongue normal; teeth and gums normal. Cobblestoning posteriorly   Neck:  Supple, no lymphadenopathy; thyroid: no enlargement/tenderness/nodules; no carotid  bruit or JVD   Back:  Spine nontender, no curvature, ROM normal, no CVA tenderness   Lungs:  Clear to auscultation bilaterally without wheezes, rales or ronchi; respirations unlabored   Chest Wall:  No tenderness or deformity   Heart:  Regular rate and rhythm, S1 and S2 normal, no murmur, rub  or gallop   Breast Exam:  Deferred to GYN   Abdomen:  Soft, non-tender, nondistended,  normoactive bowel sounds,  no masses, no hepatosplenomegaly   Genitalia:  Deferred to GYN      Extremities:  No clubbing, cyanosis or edema   Pulses:  2+ and symmetric all extremities   Skin:  Skin color, texture, turgor normal.  Excoriated papules on upper R back.   Lymph nodes:  Cervical, supraclavicular, and axillary nodes normal   Neurologic:  CNII-XII intact, normal strength, sensation and gait; reflexes 2+ and symmetric throughout   Psych: Normal mood, affect, hygiene and grooming.     Chemistry      Component Value Date/Time   NA 139 07/21/2012 0835   K 4.6 07/21/2012 5284  CL 104 07/21/2012 0835   CO2 29 07/21/2012 0835   BUN 11 07/21/2012 0835   CREATININE 0.62 07/21/2012 0835      Component Value Date/Time   CALCIUM 9.0 07/21/2012 0835   ALKPHOS 53 07/21/2012 0835   AST 19 07/21/2012 0835   ALT 14 07/21/2012 0835   BILITOT 0.5 07/21/2012 0835      Glucose 91 (normal)  Lab Results  Component Value Date   CHOL 222* 07/21/2012   HDL 83 07/21/2012   LDLCALC 123* 07/21/2012   TRIG 79 07/21/2012   CHOLHDL 2.7 07/21/2012   Lab Results  Component Value Date   WBC 4.5 07/21/2012   HGB 12.4 07/21/2012   HCT 35.5* 07/21/2012   MCV 85.1 07/21/2012   PLT 210 07/21/2012   Lab Results  Component Value Date   TSH 4.381 07/21/2012   ASSESSMENT/PLAN: 1. Routine general medical examination at a health care facility  Visual acuity screening, POCT Urinalysis Dipstick  2. Pure hypercholesterolemia  simvastatin (ZOCOR) 10 MG tablet  3. ADD (attention deficit disorder)    4. Insomnia  traZODone (DESYREL) 50 MG tablet   Trace hematuria--chronic, unchanged.  Insomnia--trial of trazadone 50mg .  1-2 at bedtime.  Reviewed risks/side effects. If ineffective call to change to Ambien CR (due to expecting it to be used daily, longterm) start with 6.25, 1-2, and call for 12.5 if needs 2 daily.  Excoriated papules on upper R back--?eczema.  F/u with derm if persists.  Moisturize well  Discussed  monthly self breast exams and yearly mammograms; at least 30 minutes of aerobic activity at least 5 days/week; proper sunscreen use reviewed; healthy diet, including goals of calcium and vitamin D intake and alcohol recommendations (less than or equal to 1 drink/day) reviewed; regular seatbelt use; changing batteries in smoke detectors. Immunization recommendations discussed. Pneumovax age 35. Risks/side effects reviewed. Colonoscopy recommendations reviewed--UTD, repeat 5 years.

## 2012-07-24 NOTE — Patient Instructions (Addendum)
HEALTH MAINTENANCE RECOMMENDATIONS:  It is recommended that you get at least 30 minutes of aerobic exercise at least 5 days/week (for weight loss, you may need as much as 60-90 minutes). This can be any activity that gets your heart rate up. This can be divided in 10-15 minute intervals if needed, but try and build up your endurance at least once a week.  Weight bearing exercise is also recommended twice weekly.  Eat a healthy diet with lots of vegetables, fruits and fiber.  "Colorful" foods have a lot of vitamins (ie green vegetables, tomatoes, red peppers, etc).  Limit sweet tea, regular sodas and alcoholic beverages, all of which has a lot of calories and sugar.  Up to 1 alcoholic drink daily may be beneficial for women (unless trying to lose weight, watch sugars).  Drink a lot of water.  Calcium recommendations are 1200-1500 mg daily (1500 mg for postmenopausal women or women without ovaries), and vitamin D 1000 IU daily.  This should be obtained from diet and/or supplements (vitamins), and calcium should not be taken all at once, but in divided doses.  Monthly self breast exams and yearly mammograms for women over the age of 29 is recommended.  Sunscreen of at least SPF 30 should be used on all sun-exposed parts of the skin when outside between the hours of 10 am and 4 pm (not just when at beach or pool, but even with exercise, golf, tennis, and yard work!)  Use a sunscreen that says "broad spectrum" so it covers both UVA and UVB rays, and make sure to reapply every 1-2 hours.  Remember to change the batteries in your smoke detectors when changing your clock times in the spring and fall.  Use your seat belt every time you are in a car, and please drive safely and not be distracted with cell phones and texting while driving.     Chemistry      Component Value Date/Time   NA 139 07/21/2012 0835   K 4.6 07/21/2012 0835   CL 104 07/21/2012 0835   CO2 29 07/21/2012 0835   BUN 11 07/21/2012 0835   CREATININE 0.62 07/21/2012 0835      Component Value Date/Time   CALCIUM 9.0 07/21/2012 0835   ALKPHOS 53 07/21/2012 0835   AST 19 07/21/2012 0835   ALT 14 07/21/2012 0835   BILITOT 0.5 07/21/2012 0835      Glucose 91 (normal)  Lab Results  Component Value Date   CHOL 222* 07/21/2012   HDL 83 07/21/2012   LDLCALC 123* 07/21/2012   TRIG 79 07/21/2012   CHOLHDL 2.7 07/21/2012   Lab Results  Component Value Date   WBC 4.5 07/21/2012   HGB 12.4 07/21/2012   HCT 35.5* 07/21/2012   MCV 85.1 07/21/2012   PLT 210 07/21/2012   Lab Results  Component Value Date   TSH 4.381 07/21/2012    Moisturize skin well after hot showers.  Keep an eye on the rash at upper right back--if it doesn't resolve, see dermatologist.

## 2012-09-12 LAB — HM MAMMOGRAPHY: HM Mammogram: NEGATIVE

## 2012-09-15 ENCOUNTER — Telehealth: Payer: Self-pay | Admitting: Internal Medicine

## 2012-09-15 DIAGNOSIS — F988 Other specified behavioral and emotional disorders with onset usually occurring in childhood and adolescence: Secondary | ICD-10-CM

## 2012-09-15 MED ORDER — AMPHETAMINE-DEXTROAMPHET ER 20 MG PO CP24: 20.0000 mg | ORAL_CAPSULE | ORAL | Status: DC

## 2012-09-15 NOTE — Telephone Encounter (Signed)
Printed/signed.  Please advise pt  

## 2012-09-15 NOTE — Telephone Encounter (Signed)
Pt.notified

## 2012-10-27 ENCOUNTER — Telehealth: Payer: Self-pay | Admitting: Family Medicine

## 2012-10-27 MED ORDER — FLUTICASONE PROPIONATE 50 MCG/ACT NA SUSP: NASAL | Status: DC

## 2012-10-27 NOTE — Telephone Encounter (Signed)
Received fax refill request from [facility]  Fluticasone prop 50 mcg, quantity  16 gr, 2 sprays each nostril daily, last dispensed 09/15/12

## 2012-10-27 NOTE — Telephone Encounter (Signed)
SENT IN [place]

## 2012-12-24 ENCOUNTER — Telehealth: Payer: Self-pay | Admitting: Family Medicine

## 2012-12-24 DIAGNOSIS — F988 Other specified behavioral and emotional disorders with onset usually occurring in childhood and adolescence: Secondary | ICD-10-CM

## 2012-12-24 NOTE — Telephone Encounter (Signed)
Needs Rx Adderal extended release 20 mg   #90    Please call when ready

## 2012-12-25 MED ORDER — AMPHETAMINE-DEXTROAMPHET ER 20 MG PO CP24: 20.0000 mg | ORAL_CAPSULE | ORAL | Status: DC

## 2012-12-28 ENCOUNTER — Ambulatory Visit (INDEPENDENT_AMBULATORY_CARE_PROVIDER_SITE_OTHER): Payer: BC Managed Care – PPO | Admitting: Emergency Medicine

## 2012-12-28 VITALS — BP 104/65 | HR 86 | Temp 98.0°F | Resp 17 | Ht 60.0 in | Wt 105.0 lb

## 2012-12-28 DIAGNOSIS — T148XXA Other injury of unspecified body region, initial encounter: Secondary | ICD-10-CM

## 2012-12-28 DIAGNOSIS — W5501XA Bitten by cat, initial encounter: Secondary | ICD-10-CM

## 2012-12-28 MED ORDER — AMOXICILLIN-POT CLAVULANATE 875-125 MG PO TABS: 1.0000 | ORAL_TABLET | Freq: Two times a day (BID) | ORAL | Status: DC

## 2012-12-28 NOTE — Progress Notes (Signed)
[facility] [address] [place] [address] [phone]  Date:  12/28/2012   Name:  Nicole Allen   DOB:  1951-03-29   MRN:  [id number]  PCP:  Lavonda Jumbo, MD    Chief Complaint: cat bite to arm   History of Present Illness:  Nicole Allen is a 62 y.o. very pleasant female patient who presents with the following:  Injured last night when she was walking a dog on a leash. Two cats attacked the dog and she was injured picking the dog up from the ground.  Has bites on the left upper arm.  No fever or chills.  No other complaint.  Patient Active Problem List   Diagnosis Date Noted  . Pure hypercholesterolemia 04/24/2011  . ADD (attention deficit disorder) 04/24/2011    Past Medical History  Diagnosis Date  . Allergy     SEASONAL  . ADD (attention deficit disorder)   . Postmenopausal     on HRT--sees GYN  . Kidney stone     x3, Dr. Wanda Plump  . Pure hypercholesterolemia   . Osteopenia   . BCC (basal cell carcinoma of skin)     chest; Dr. Danella Deis  . Cervical spondylosis   . Insomnia     worse with menopause  . Hearing loss     high frequency (Dr. Lenard Forth)  . Elevated cholesterol   . History of kidney stones     x3  . BCC (basal cell carcinoma)     Danella Deis    Past Surgical History  Procedure Laterality Date  . Tonsillectomy and adenoidectomy    . Hymenectomy  1993  . Tubal ligation  1991  . Cesarean section  [id number]  . Colonoscopy  2003, 2013    Dr. Loreta Ave    History  Substance Use Topics  . Smoking status: Never Smoker   . Smokeless tobacco: Never Used  . Alcohol Use: Yes     Comment: 3-5 drinks per week.    Family History  Problem Relation Age of Onset  . Asthma Mother   . Arthritis Mother   . Hypertension Mother   . Hyperlipidemia Mother   . Hypertension Father   . Heart disease Father     16's  . Hyperlipidemia Father   . Alzheimer's disease Maternal Grandmother   . Cancer Maternal Grandfather     lung  . Cancer  Paternal Grandfather     lung  . ADD / ADHD Daughter   . ADD / ADHD Son     Allergies  Allergen Reactions  . Morphine And Related Other (See Comments)    Family history.  . Codeine Itching and Rash    Medication list has been reviewed and updated.  Current Outpatient Prescriptions on File Prior to Visit  Medication Sig Dispense Refill  . amphetamine-dextroamphetamine (ADDERALL XR) 20 MG 24 hr capsule Take 1 capsule (20 mg total) by mouth every morning.  90 capsule  0  . aspirin 81 MG tablet Take 81 mg by mouth daily.      Marland Kitchen co-enzyme Q-10 30 MG capsule Take 30 mg by mouth daily.      Marland Kitchen ESTRACE VAGINAL 0.1 MG/GM vaginal cream Place 2 g vaginally 2 (two) times a week.       . fish oil-omega-3 fatty acids 1000 MG capsule Take 1 g by mouth 2 (two) times daily.      . fluticasone (FLONASE) 50 MCG/ACT nasal spray USE 2 SPRAYS  IN EACH NOSTRIL ONCE A DAY.  16 g  2  . loratadine (CLARITIN) 10 MG tablet Take 10 mg by mouth daily.        . simvastatin (ZOCOR) 10 MG tablet Take 1 tablet (10 mg total) by mouth at bedtime.  90 tablet  3  . traZODone (DESYREL) 50 MG tablet Take 1-2 tablets (50-100 mg total) by mouth at bedtime as needed for sleep.  60 tablet  11   No current facility-administered medications on file prior to visit.    Review of Systems:  As per HPI, otherwise negative.    Physical Examination: Filed Vitals:   12/28/12 1028  BP: 104/65  Pulse: 86  Temp: 98 F (36.7 C)  Resp: 17   Filed Vitals:   12/28/12 1028  Height: 5' (1.524 m)  Weight: 105 lb (47.628 kg)   Body mass index is 20.51 kg/(m^2). Ideal Body Weight: Weight in (lb) to have BMI = 25: 127.7   GEN: WDWN, NAD, Non-toxic, Alert & Oriented x 3 HEENT: Atraumatic, Normocephalic.  Ears and Nose: No external deformity. EXTR: No clubbing/cyanosis/edema NEURO: Normal gait.  PSYCH: Normally interactive. Conversant. Not depressed or anxious appearing.  Calm demeanor.  Multiple puncture wounds to left upper  arm.  No FB.  NATI  Assessment and Plan: Puncture wound arm Cat bite augmentin Will check on Monday with [facility] about tetanus  Signed,  Phillips Odor, MD

## 2012-12-28 NOTE — Patient Instructions (Addendum)

## 2012-12-29 ENCOUNTER — Telehealth: Payer: Self-pay | Admitting: Family Medicine

## 2012-12-29 NOTE — Telephone Encounter (Signed)
Advise pt--tetanus is UTD, not needed

## 2012-12-29 NOTE — Telephone Encounter (Signed)
Patient advised.

## 2013-02-23 ENCOUNTER — Other Ambulatory Visit: Payer: Self-pay | Admitting: Family Medicine

## 2013-03-18 ENCOUNTER — Telehealth: Payer: Self-pay | Admitting: Internal Medicine

## 2013-03-18 DIAGNOSIS — F988 Other specified behavioral and emotional disorders with onset usually occurring in childhood and adolescence: Secondary | ICD-10-CM

## 2013-03-18 MED ORDER — AMPHETAMINE-DEXTROAMPHET ER 20 MG PO CP24: 20.0000 mg | ORAL_CAPSULE | ORAL | Status: DC

## 2013-03-18 NOTE — Telephone Encounter (Signed)
signed

## 2013-03-18 NOTE — Telephone Encounter (Signed)
Pt needs a 3 month supply for her adderall. Pt has a physical booked for 08/03/13 with you. Call when ready

## 2013-03-18 NOTE — Telephone Encounter (Signed)
Called patient to let her know that rx was ready to be picked up. 

## 2013-06-11 ENCOUNTER — Telehealth: Payer: Self-pay | Admitting: Internal Medicine

## 2013-06-11 NOTE — Telephone Encounter (Signed)
Pt states she got a letter from her insurance that her Generic Adderall was about to expire and the doctor office needed to contact her insurance about this. Case # [id number] phone # [phone] and she has [employer] state health plan

## 2013-06-12 NOTE — Telephone Encounter (Signed)
LM

## 2013-06-19 ENCOUNTER — Telehealth: Payer: Self-pay | Admitting: Family Medicine

## 2013-06-19 DIAGNOSIS — F988 Other specified behavioral and emotional disorders with onset usually occurring in childhood and adolescence: Secondary | ICD-10-CM

## 2013-06-19 NOTE — Telephone Encounter (Signed)
Can you please make sure that pt realizes it will be Monday before it is done, since I'm not in the office.  I will print/sign rx Monday.  If that is a problem/concern, then we will need to ask a provider in office for it today.  She has appt in Feb, ok for 90 day rx.  I'm assuming that it should be fine for her to get Monday, but just want to verify

## 2013-06-19 NOTE — Telephone Encounter (Signed)
Needs Rx adderal XR (generic) 90 day supply  Please call when ready

## 2013-06-22 MED ORDER — AMPHETAMINE-DEXTROAMPHET ER 20 MG PO CP24: 20.0000 mg | ORAL_CAPSULE | ORAL | Status: DC

## 2013-06-22 NOTE — Telephone Encounter (Signed)
rx done.  Please advise pt ready.

## 2013-06-22 NOTE — Telephone Encounter (Signed)
Pt made aware when she called, it would be Monday

## 2013-06-22 NOTE — Telephone Encounter (Signed)
Left message that Rx Adderall ready to be picked up-lm

## 2013-06-30 ENCOUNTER — Other Ambulatory Visit: Payer: Self-pay | Admitting: Family Medicine

## 2013-07-02 DIAGNOSIS — E039 Hypothyroidism, unspecified: Secondary | ICD-10-CM

## 2013-07-02 HISTORY — DX: Hypothyroidism, unspecified: E03.9

## 2013-07-27 ENCOUNTER — Telehealth: Payer: Self-pay | Admitting: Internal Medicine

## 2013-07-27 ENCOUNTER — Other Ambulatory Visit: Payer: BC Managed Care – PPO

## 2013-07-27 DIAGNOSIS — E7800 Pure hypercholesterolemia, unspecified: Secondary | ICD-10-CM

## 2013-07-27 DIAGNOSIS — Z79899 Other long term (current) drug therapy: Secondary | ICD-10-CM

## 2013-07-27 NOTE — Telephone Encounter (Signed)
Pt came in today for labs but no orders were in the computer and i told pt i would have to check with you first to get orders. Pt will be back in on Wednesday morning before she goes out of town to get labs drawn. She has an appt for a physical on Monday 2/2 with you.  Please put in future order

## 2013-07-27 NOTE — Telephone Encounter (Signed)
Called pt to see if she wanted to come in tomorrow or keep her Wednesday appt since future orders are in the computer

## 2013-07-27 NOTE — Telephone Encounter (Signed)
Orders have been entered.  People canNOT schedule labs without a message being sent to me in advance for orders (if no orders already in system)

## 2013-07-28 ENCOUNTER — Other Ambulatory Visit: Payer: BC Managed Care – PPO

## 2013-07-28 DIAGNOSIS — E7800 Pure hypercholesterolemia, unspecified: Secondary | ICD-10-CM

## 2013-07-28 DIAGNOSIS — Z79899 Other long term (current) drug therapy: Secondary | ICD-10-CM

## 2013-07-28 LAB — CBC WITH DIFFERENTIAL/PLATELET
Basophils Absolute: 0.1 10*3/uL (ref 0.0–0.1)
Basophils Relative: 1 % (ref 0–1)
EOS ABS: 0.1 10*3/uL (ref 0.0–0.7)
EOS PCT: 3 % (ref 0–5)
HCT: 36.6 % (ref 36.0–46.0)
HEMOGLOBIN: 12.3 g/dL (ref 12.0–15.0)
LYMPHS ABS: 1.7 10*3/uL (ref 0.7–4.0)
Lymphocytes Relative: 38 % (ref 12–46)
MCH: 29 pg (ref 26.0–34.0)
MCHC: 33.6 g/dL (ref 30.0–36.0)
MCV: 86.3 fL (ref 78.0–100.0)
MONOS PCT: 9 % (ref 3–12)
Monocytes Absolute: 0.4 10*3/uL (ref 0.1–1.0)
Neutro Abs: 2.2 10*3/uL (ref 1.7–7.7)
Neutrophils Relative %: 49 % (ref 43–77)
Platelets: 224 10*3/uL (ref 150–400)
RBC: 4.24 MIL/uL (ref 3.87–5.11)
RDW: 13.8 % (ref 11.5–15.5)
WBC: 4.4 10*3/uL (ref 4.0–10.5)

## 2013-07-29 ENCOUNTER — Other Ambulatory Visit: Payer: BC Managed Care – PPO

## 2013-07-29 LAB — COMPREHENSIVE METABOLIC PANEL
ALK PHOS: 55 U/L (ref 39–117)
ALT: 12 U/L (ref 0–35)
AST: 17 U/L (ref 0–37)
Albumin: 4.2 g/dL (ref 3.5–5.2)
BUN: 9 mg/dL (ref 6–23)
CO2: 31 meq/L (ref 19–32)
Calcium: 9.3 mg/dL (ref 8.4–10.5)
Chloride: 98 mEq/L (ref 96–112)
Creat: 0.71 mg/dL (ref 0.50–1.10)
GLUCOSE: 87 mg/dL (ref 70–99)
Potassium: 4.2 mEq/L (ref 3.5–5.3)
SODIUM: 133 meq/L — AB (ref 135–145)
TOTAL PROTEIN: 6.3 g/dL (ref 6.0–8.3)
Total Bilirubin: 0.5 mg/dL (ref 0.3–1.2)

## 2013-07-29 LAB — LIPID PANEL
CHOLESTEROL: 217 mg/dL — AB (ref 0–200)
HDL: 91 mg/dL (ref >39)
LDL Cholesterol: 103 mg/dL — ABNORMAL HIGH (ref 0–99)
TRIGLYCERIDES: 114 mg/dL (ref <150)
Total CHOL/HDL Ratio: 2.4 Ratio
VLDL: 23 mg/dL (ref 0–40)

## 2013-07-29 LAB — TSH: TSH: 6.769 u[IU]/mL — ABNORMAL HIGH (ref 0.350–4.500)

## 2013-08-03 ENCOUNTER — Encounter: Payer: Self-pay | Admitting: Family Medicine

## 2013-08-03 ENCOUNTER — Ambulatory Visit (INDEPENDENT_AMBULATORY_CARE_PROVIDER_SITE_OTHER): Payer: BC Managed Care – PPO | Admitting: Family Medicine

## 2013-08-03 VITALS — BP 106/62 | HR 68 | Ht 59.0 in | Wt 106.0 lb

## 2013-08-03 DIAGNOSIS — G4700 Insomnia, unspecified: Secondary | ICD-10-CM

## 2013-08-03 DIAGNOSIS — E7800 Pure hypercholesterolemia, unspecified: Secondary | ICD-10-CM

## 2013-08-03 DIAGNOSIS — Z0000 Encounter for general adult medical examination without abnormal findings: Secondary | ICD-10-CM

## 2013-08-03 DIAGNOSIS — M8580 Other specified disorders of bone density and structure, unspecified site: Secondary | ICD-10-CM

## 2013-08-03 DIAGNOSIS — R7989 Other specified abnormal findings of blood chemistry: Secondary | ICD-10-CM

## 2013-08-03 DIAGNOSIS — F988 Other specified behavioral and emotional disorders with onset usually occurring in childhood and adolescence: Secondary | ICD-10-CM

## 2013-08-03 DIAGNOSIS — E039 Hypothyroidism, unspecified: Secondary | ICD-10-CM

## 2013-08-03 DIAGNOSIS — M949 Disorder of cartilage, unspecified: Secondary | ICD-10-CM

## 2013-08-03 DIAGNOSIS — J309 Allergic rhinitis, unspecified: Secondary | ICD-10-CM

## 2013-08-03 DIAGNOSIS — M899 Disorder of bone, unspecified: Secondary | ICD-10-CM

## 2013-08-03 LAB — POCT URINALYSIS DIPSTICK
Bilirubin, UA: NEGATIVE
Blood, UA: NEGATIVE
Glucose, UA: NEGATIVE
Ketones, UA: NEGATIVE
Leukocytes, UA: NEGATIVE
Nitrite, UA: NEGATIVE
Protein, UA: NEGATIVE
Spec Grav, UA: <=1.005
Urobilinogen, UA: NEGATIVE
pH, UA: 6

## 2013-08-03 MED ORDER — FLUTICASONE PROPIONATE 50 MCG/ACT NA SUSP: NASAL | Status: DC

## 2013-08-03 MED ORDER — TRAZODONE HCL 50 MG PO TABS
50.0000 mg | ORAL_TABLET | Freq: Every evening | ORAL | Status: DC | PRN
Start: 2013-08-03 — End: 2014-08-19

## 2013-08-03 NOTE — Progress Notes (Signed)
Chief Complaint  Patient presents with  . Annual Exam    fasting annual exam without pap, sees Dr.Fogleman and [place] UTD. DId not do eye exam as patient states that she sees her eye doctor regularly ad has monovision contacts and would not be able to see the eye chart. No concerns or complaints today.    Nicole Allen is a 63 y.o. female who presents for a complete physical.  She has the following concerns:  ADD: takes meds daily. Effective. No side effects. She is too scattered, even with retirement, to go off of the medication.    Insomnia--Trazadone is working very well for her.  Uses it nightly, and denies side effects. Sometime she has trouble falling asleep related to her husband's snoring.  Hypothyroidism--She had borderline TSH last year, and repeat this year was higher.  Reviewed labs reviewed with patient.  She has always been colder than her husband, no recent change. Her fingertips are always cold.  She has, however, noticed that she is losing more hair, seeing it in the drain.  Allergies:  Well controlled with Flonase, and claritin.  Her nose tends to be very stuffy, and for the last few months she has needed to take a benadryl every night at bedtime to help with the nasal stuffiness.  That seems to control her symptoms.  Immunization History  Administered Date(s) Administered  . Influenza Split 04/23/2011  . Influenza, Seasonal, Injecte, Preservative Fre 06/09/2012  . Tdap 12/26/2005  . Zoster 05/14/2011  got flu shot elsewhere Last Pap smear: 12/2011, through GYN Last mammogram: 08/2012  Last colonoscopy: 12/2011, small polyp, due again in 5 years  Last DEXA:  She believes it was last 2013 (done by GYN) Ophtho: yearly  Dentist: every 6 months  Exercise: 3-5 times/week (classes [facility])  Past Medical History  Diagnosis Date  . Allergy     SEASONAL  . ADD (attention deficit disorder)   . Postmenopausal     on HRT--sees GYN  . Kidney stone     x3, Dr. Reece Agar  . Pure  hypercholesterolemia   . Osteopenia   . BCC (basal cell carcinoma of skin)     chest; Dr. Tonia Brooms  . Cervical spondylosis   . Insomnia     worse with menopause  . Hearing loss     high frequency (Dr. Prescott Parma); hearing aids recommended; bilateral  . History of kidney stones     x3  . BCC (basal cell carcinoma)     Tonia Brooms    Past Surgical History  Procedure Laterality Date  . Tonsillectomy and adenoidectomy    . Hymenectomy  1993  . Tubal ligation  1991  . Cesarean section  [id number]  . Colonoscopy  2003, 2013    Dr. Collene Mares    History   Social History  . Marital Status: Married    Spouse Name: N/A    Number of Children: 2  . Years of Education: N/A   Occupational History  . retired Teaching laboratory technician    Social History Main Topics  . Smoking status: Never Smoker   . Smokeless tobacco: Never Used  . Alcohol Use: Yes     Comment: 3-5 drinks per week.  . Drug Use: No  . Sexual Activity: Yes    Partners: Male   Other Topics Concern  . Not on file   Social History Narrative   Lives with husband.  Son in [place], [place] in [place], Daughter studying in [place], [place]  Family History  Problem Relation Age of Onset  . Asthma Mother   . Arthritis Mother   . Hypertension Mother   . Hyperlipidemia Mother   . Hypertension Father   . Heart disease Father     97's  . Hyperlipidemia Father   . Alzheimer's disease Maternal Grandmother   . Cancer Maternal Grandfather     lung  . Cancer Paternal Grandfather     lung  . ADD / ADHD Daughter   . ADD / ADHD Son    Current Outpatient Prescriptions on File Prior to Visit  Medication Sig Dispense Refill  . amphetamine-dextroamphetamine (ADDERALL XR) 20 MG 24 hr capsule Take 1 capsule (20 mg total) by mouth every morning.  90 capsule  0  . aspirin 81 MG tablet Take 81 mg by mouth daily.      Marland Kitchen co-enzyme Q-10 30 MG capsule Take 30 mg by mouth daily.      Marland Kitchen loratadine (CLARITIN) 10 MG tablet Take 10 mg by mouth daily.          No current facility-administered medications on file prior to visit.  Simvastatin 62m once daily  Allergies  Allergen Reactions  . Morphine And Related Other (See Comments)    Family history.  . Codeine Itching and Rash   ROS: The patient denies anorexia, fever, weight changes, headaches, vision changes, ear pain, sore throat, breast concerns, chest pain, palpitations, dizziness, syncope, dyspnea on exertion, cough, swelling, nausea, vomiting, diarrhea, constipation, abdominal pain, melena, hematochezia, indigestion/heartburn, hematuria, incontinence, dysuria, vaginal bleeding, discharge, odor or itch, genital lesions, joint pains, numbness, tingling, weakness, tremor, suspicious skin lesions, depression, anxiety, abnormal bleeding/bruising, or enlarged lymph nodes.  +bilateral hearing loss; plans to get hearing aids when affordable in the future. +R knee and L shoulder mild pain, mild, tolerable, stable  PHYSICAL EXAM: BP 106/62  Pulse 68  Ht 4' 11"  (1.499 m)  Wt 106 lb (48.081 kg)  BMI 21.40 kg/m2  General Appearance:  Alert, cooperative, no distress, appears stated age   Head:  Normocephalic, without obvious abnormality, atraumatic   Eyes:  PERRL, conjunctiva/corneas clear, EOM's intact, fundi  benign   Ears:  Normal TM's and external ear canals   Nose:  Nares normal, mucosa mildly edematous, no drainage or sinus tenderness   Throat:  Lips, mucosa, and tongue normal; teeth and gums normal. Cobblestoning posteriorly   Neck:  Supple, no lymphadenopathy; thyroid: no enlargement/tenderness/nodules; no carotid  bruit or JVD   Back:  Spine nontender, no curvature, ROM normal, no CVA tenderness   Lungs:  Clear to auscultation bilaterally without wheezes, rales or ronchi; respirations unlabored   Chest Wall:  No tenderness or deformity   Heart:  Regular rate and rhythm, S1 and S2 normal, no murmur, rub  or gallop   Breast Exam:  Deferred to GYN   Abdomen:  Soft, non-tender,  nondistended, normoactive bowel sounds,  no masses, no hepatosplenomegaly   Genitalia:  Deferred to GYN      Extremities:  No clubbing, cyanosis or edema   Pulses:  2+ and symmetric all extremities   Skin:  Skin color, texture, turgor normal. Excoriated papules on upper R back.   Lymph nodes:  Cervical, supraclavicular, and axillary nodes normal   Neurologic:  CNII-XII intact, normal strength, sensation and gait; reflexes 2+ and symmetric throughout          Psych: Normal mood, affect, hygiene and grooming.     Chemistry      Component Value  Date/Time   NA 133* 07/28/2013 0848   K 4.2 07/28/2013 0848   CL 98 07/28/2013 0848   CO2 31 07/28/2013 0848   BUN 9 07/28/2013 0848   CREATININE 0.71 07/28/2013 0848      Component Value Date/Time   CALCIUM 9.3 07/28/2013 0848   ALKPHOS 55 07/28/2013 0848   AST 17 07/28/2013 0848   ALT 12 07/28/2013 0848   BILITOT 0.5 07/28/2013 0848     Lab Results  Component Value Date   CHOL 217* 07/28/2013   HDL 91 07/28/2013   LDLCALC 103* 07/28/2013   TRIG 114 07/28/2013   CHOLHDL 2.4 07/28/2013   Lab Results  Component Value Date   TSH 6.769* 07/28/2013   ASSESSMENT/PLAN:  Routine general medical examination at a health care facility - Plan: POCT Urinalysis Dipstick  Pure hypercholesterolemia - excellent lipid profile, on just very low dose simvastatin.  Trial off meds.  repeat in 3 mos - Plan: Lipid panel  ADD (attention deficit disorder)  Insomnia - well controlled with trazadone, usually just needing 45m qHS - Plan: traZODone (DESYREL) 50 MG tablet  Allergic rhinitis, cause unspecified - controlled with Flonase, claritin and nightly benadryl - Plan: fluticasone (FLONASE) 50 MCG/ACT nasal spray  Osteopenia - monitored by GYN - Plan: Vit D  25 hydroxy (rtn osteoporosis monitoring)  Other abnormal blood chemistry - Plan: Basic metabolic panel  Unspecified hypothyroidism - very minimally symptomatic.  prefers to wait on starting meds, recheck in 3  mos.  Check TPO Ab with labs - Plan: TSH, Thyroid peroxidase antibody  Discussed monthly self breast exams and yearly mammograms; at least 30 minutes of aerobic activity at least 5 days/week; proper sunscreen use reviewed; healthy diet, including goals of calcium and vitamin D intake and alcohol recommendations (less than or equal to 1 drink/day) reviewed; regular seatbelt use; changing batteries in smoke detectors. Immunization recommendations discussed. Pneumovax age 63 Risks/side effects reviewed. Colonoscopy recommendations reviewed--UTD  Drank a lot of water on day of blood test (thought she had to give urine specimen)--likely the cause of mild hyponatremia.   Hypothyroidism--minimally symptomatic.  Very mild hair loss, and mild seasonal affective disorder (during winter, when she can't be in her yard).  Doesn't feel as good now.  Could also possibly related to Vitamin D (she used to take a calcium +D, stopped per GYN, but never started separate D).  Return in 3 months--TSH, TPO, lipids, b-met, vit D Add on Vitamin D--unable to add on.  Instead--start taking 1000 IU daily.  Check level in 3 months

## 2013-08-03 NOTE — Patient Instructions (Signed)
HEALTH MAINTENANCE RECOMMENDATIONS:  It is recommended that you get at least 30 minutes of aerobic exercise at least 5 days/week (for weight loss, you may need as much as 60-90 minutes). This can be any activity that gets your heart rate up. This can be divided in 10-15 minute intervals if needed, but try and build up your endurance at least once a week.  Weight bearing exercise is also recommended twice weekly.  Eat a healthy diet with lots of vegetables, fruits and fiber.  "Colorful" foods have a lot of vitamins (ie green vegetables, tomatoes, red peppers, etc).  Limit sweet tea, regular sodas and alcoholic beverages, all of which has a lot of calories and sugar.  Up to 1 alcoholic drink daily may be beneficial for women (unless trying to lose weight, watch sugars).  Drink a lot of water.  Calcium recommendations are 1200-1500 mg daily (1500 mg for postmenopausal women or women without ovaries), and vitamin D 1000 IU daily.  This should be obtained from diet and/or supplements (vitamins), and calcium should not be taken all at once, but in divided doses.  Monthly self breast exams and yearly mammograms for women over the age of 46 is recommended.  Sunscreen of at least SPF 30 should be used on all sun-exposed parts of the skin when outside between the hours of 10 am and 4 pm (not just when at beach or pool, but even with exercise, golf, tennis, and yard work!)  Use a sunscreen that says "broad spectrum" so it covers both UVA and UVB rays, and make sure to reapply every 1-2 hours.  Remember to change the batteries in your smoke detectors when changing your clock times in the spring and fall.  Use your seat belt every time you are in a car, and please drive safely and not be distracted with cell phones and texting while driving.  Stop taking the simvastatin as a trial off med, to see if your cholesterol remains at goal without it.  Return in 3 months for fasting labs.  Follow a low cholesterol  diet.  Start taking Vitamin D 1000 IU every day.  Call us sooner than 3 months if you notice significant thyroid symptoms develop   Hypothyroidism The thyroid is a large gland located in the lower front of your neck. The thyroid gland helps control metabolism. Metabolism is how your body handles food. It controls metabolism with the hormone thyroxine. When this gland is underactive (hypothyroid), it produces too little hormone.  CAUSES These include:   Absence or destruction of thyroid tissue.  Goiter due to iodine deficiency.  Goiter due to medications.  Congenital defects (since birth).  Problems with the pituitary. This causes a lack of TSH (thyroid stimulating hormone). This hormone tells the thyroid to turn out more hormone. SYMPTOMS  Lethargy (feeling as though you have no energy)  Cold intolerance  Weight gain (in spite of normal food intake)  Dry skin  Coarse hair  Menstrual irregularity (if severe, may lead to infertility)  Slowing of thought processes Cardiac problems are also caused by insufficient amounts of thyroid hormone. Hypothyroidism in the newborn is cretinism, and is an extreme form. It is important that this form be treated adequately and immediately or it will lead rapidly to retarded physical and mental development. DIAGNOSIS  To prove hypothyroidism, your caregiver may do blood tests and ultrasound tests. Sometimes the signs are hidden. It may be necessary for your caregiver to watch this illness with blood tests either  before or after diagnosis and treatment. TREATMENT  Low levels of thyroid hormone are increased by using synthetic thyroid hormone. This is a safe, effective treatment. It usually takes about four weeks to gain the full effects of the medication. After you have the full effect of the medication, it will generally take another four weeks for problems to leave. Your caregiver may start you on low doses. If you have had heart problems the  dose may be gradually increased. It is generally not an emergency to get rapidly to normal. HOME CARE INSTRUCTIONS   Take your medications as your caregiver suggests. Let your caregiver know of any medications you are taking or start taking. Your caregiver will help you with dosage schedules.  As your condition improves, your dosage needs may increase. It will be necessary to have continuing blood tests as suggested by your caregiver.  Report all suspected medication side effects to your caregiver. SEEK MEDICAL CARE IF: Seek medical care if you develop:  Sweating.  Tremulousness (tremors).  Anxiety.  Rapid weight loss.  Heat intolerance.  Emotional swings.  Diarrhea.  Weakness. SEEK IMMEDIATE MEDICAL CARE IF:  You develop chest pain, an irregular heart beat (palpitations), or a rapid heart beat. MAKE SURE YOU:   Understand these instructions.  Will watch your condition.  Will get help right away if you are not doing well or get worse. Document Released: 06/18/2005 Document Revised: 09/10/2011 Document Reviewed: 02/06/2008 [facility] Patient Information 2014 [place].

## 2013-08-24 ENCOUNTER — Ambulatory Visit (INDEPENDENT_AMBULATORY_CARE_PROVIDER_SITE_OTHER): Payer: BC Managed Care – PPO | Admitting: Family Medicine

## 2013-08-24 ENCOUNTER — Encounter: Payer: Self-pay | Admitting: Family Medicine

## 2013-08-24 VITALS — BP 112/70 | HR 72 | Temp 98.4°F | Ht 59.0 in | Wt 106.0 lb

## 2013-08-24 DIAGNOSIS — R059 Cough, unspecified: Secondary | ICD-10-CM

## 2013-08-24 DIAGNOSIS — T753XXA Motion sickness, initial encounter: Secondary | ICD-10-CM

## 2013-08-24 DIAGNOSIS — R05 Cough: Secondary | ICD-10-CM

## 2013-08-24 DIAGNOSIS — J019 Acute sinusitis, unspecified: Secondary | ICD-10-CM

## 2013-08-24 MED ORDER — HYDROCOD POLST-CHLORPHEN POLST 10-8 MG/5ML PO LQCR: 5.0000 mL | Freq: Every evening | ORAL | Status: DC | PRN

## 2013-08-24 MED ORDER — AZITHROMYCIN 250 MG PO TABS: ORAL_TABLET | ORAL | Status: DC

## 2013-08-24 MED ORDER — SCOPOLAMINE 1 MG/3DAYS TD PT72: 1.0000 | MEDICATED_PATCH | TRANSDERMAL | Status: DC

## 2013-08-24 MED ORDER — BENZONATATE 200 MG PO CAPS: 200.0000 mg | ORAL_CAPSULE | Freq: Three times a day (TID) | ORAL | Status: DC | PRN

## 2013-08-24 NOTE — Patient Instructions (Signed)
  Drink plenty of fluids.  Use some form of guaifenesin (ie Mucinex, robitussin) round the clock to keep the phlegm loose.  I also recommend use of sinus rinses (ie Neti-pot or sinus rinse kit), once or twice daily. Use the Tussionex at bedtime (sedation can last for 12 hours, so be careful with early morning driving). The tessalon is nonsedating, and can be used three times daily, if needed. Call on day 10 (day 1 being today, the day you start the antibiotics) if you are not completely better, to have a refill of the z-pak.  Call sooner (ie in 5-7 days) if you are not improving at all, and call sooner, if getting worse--ie high fevers, worsening shortness of breath.    Chemistry      Component Value Date/Time   NA 133* 07/28/2013 0848   K 4.2 07/28/2013 0848   CL 98 07/28/2013 0848   CO2 31 07/28/2013 0848   BUN 9 07/28/2013 0848   CREATININE 0.71 07/28/2013 0848      Component Value Date/Time   CALCIUM 9.3 07/28/2013 0848   ALKPHOS 55 07/28/2013 0848   AST 17 07/28/2013 0848   ALT 12 07/28/2013 0848   BILITOT 0.5 07/28/2013 0848     Glucose 87  Lab Results  Component Value Date   TSH 6.769* 07/28/2013   Lab Results  Component Value Date   CHOL 217* 07/28/2013   HDL 91 07/28/2013   LDLCALC 103* 07/28/2013   TRIG 114 07/28/2013   CHOLHDL 2.4 07/28/2013   Lab Results  Component Value Date   WBC 4.4 07/28/2013   HGB 12.3 07/28/2013   HCT 36.6 07/28/2013   MCV 86.3 07/28/2013   PLT 224 07/28/2013

## 2013-08-24 NOTE — Progress Notes (Signed)
Chief Complaint  Patient presents with  . Cough    started coughing a week ago Sunday. Can't sleep at all. So tired she can't function. Coughing up green mucus. No fevers, body aches and chill. HA from coughing so much.    8 days ago she started with a dry cough.  Over this past weekend she started coughing up thick, green mucus.  She is having head congestion, no runny nose.  She feels somewhat "spacy" and dizzy.  Denies ear pain.  She denies vertigo, slightly light-headed at times.  Some sore throat and hoarse voice.  She hasn't been able to sleep due to cough.    She tried OTC expectorants, cough suppressants (mucinex, tussin).  Sudafed has helped with her congestion, but she can't take it at night.  Nothing else seems to help much. +SOB, getting winded going up her stairs. Only very low grade fevers (up 1/2 a degree, per pt).  No chills.  No nausea, vomiting, post-tussive emesis  She is going on a sailing trip for 8 days, asking for patch since she gets carsick.  She is asking for printout of her last labs  Past Medical History  Diagnosis Date  . Allergy     SEASONAL  . ADD (attention deficit disorder)   . Postmenopausal     on HRT--sees GYN  . Kidney stone     x3, Dr. Reece Agar  . Pure hypercholesterolemia   . Osteopenia   . BCC (basal cell carcinoma of skin)     chest; Dr. Tonia Brooms  . Cervical spondylosis   . Insomnia     worse with menopause  . Hearing loss     high frequency (Dr. Prescott Parma); hearing aids recommended; bilateral  . History of kidney stones     x3  . BCC (basal cell carcinoma)     Tonia Brooms   Past Surgical History  Procedure Laterality Date  . Tonsillectomy and adenoidectomy    . Hymenectomy  1993  . Tubal ligation  1991  . Cesarean section  [id number]  . Colonoscopy  2003, 2013    Dr. Collene Mares   History   Social History  . Marital Status: Married    Spouse Name: N/A    Number of Children: 2  . Years of Education: N/A   Occupational History  . retired  Teaching laboratory technician    Social History Main Topics  . Smoking status: Never Smoker   . Smokeless tobacco: Never Used  . Alcohol Use: Yes     Comment: 3-5 drinks per week.  . Drug Use: No  . Sexual Activity: Yes    Partners: Male   Other Topics Concern  . Not on file   Social History Narrative   Lives with husband.  Son in [place] in [place], Daughter studying in [place], [place]    Outpatient Encounter Prescriptions as of 08/24/2013  Medication Sig  . amphetamine-dextroamphetamine (ADDERALL XR) 20 MG 24 hr capsule Take 1 capsule (20 mg total) by mouth every morning.  Marland Kitchen aspirin 81 MG tablet Take 81 mg by mouth daily.  Marland Kitchen co-enzyme Q-10 30 MG capsule Take 30 mg by mouth daily.  . fluticasone (FLONASE) 50 MCG/ACT nasal spray USE 2 SPRAYS IN EACH NOSTRIL ONCE A DAY.  Marland Kitchen loratadine (CLARITIN) 10 MG tablet Take 10 mg by mouth daily.    . NON FORMULARY   . traZODone (DESYREL) 50 MG tablet Take 1-2 tablets (50-100 mg total) by mouth at bedtime as needed for sleep.  Marland Kitchen  diphenhydrAMINE (BENADRYL) 25 MG tablet Take 25 mg by mouth at bedtime.   Allergies  Allergen Reactions  . Morphine And Related Other (See Comments)    Family history.  . Codeine Itching and Rash   ROS:  Denies chills, nausea, vomiting, diarrhea.  +headache just with coughing spells.  No chest pain.  +shortness of breath.  No bleeding, bruising, rashes.  PHYSICAL EXAM: BP 112/70  Pulse 72  Temp(Src) 98.4 F (36.9 C) (Oral)  Ht 4' 11"  (1.499 m)  Wt 106 lb (48.081 kg)  BMI 21.40 kg/m2 Well developed, pleasant female, with hoarse voice and frequent throat clearing (trying hard to clear mucus from her throat). No coughing during visit. HEENT:  PERRL, EOMI, conjunctiva clear.  TM's and EAC's normal.  Nasal mucosa is mild-mod edematous, with thick yellow mucus in left nares.  Sinuses are nontender. OP is erythematous posteriorly, mucus membranes are moist Neck: no lymphadenopathy or thyromegaly Heart: regular rate and  rhythm without murmur Lungs: clear bilaterally.  No wheezes, rales or ronchi.  Good air movement Neuro: alert and oriented, cranial nerves intact. Normal strength, gait Psych: normal mood, affect, hygiene and grooming Skin: no rash  ASSESSMENT/PLAN:  Acute sinusitis - Plan: azithromycin (ZITHROMAX) 250 MG tablet  Cough - Plan: chlorpheniramine-HYDROcodone (TUSSIONEX PENNKINETIC ER) 10-8 MG/5ML LQCR, benzonatate (TESSALON) 200 MG capsule  Motion sickness - Plan: scopolamine (TRANSDERM-SCOP) 1.5 MG  Drink plenty of fluids.  Use some form of guaifenesin (ie Mucinex, robitussin) round the clock to keep the phlegm loose.  I also recommend use of sinus rinses (ie Neti-pot or sinus rinse kit), once or twice daily. Use the Tussionex at bedtime (sedation can last for 12 hours, so be careful with early morning driving). The tessalon is nonsedating, and can be used three times daily, if needed. Call on day 10 (day 1 being today, the day you start the antibiotics) if you are not completely better, to have a refill of the z-pak.  Call sooner (ie in 5-7 days) if you are not improving at all, and call sooner, if getting worse--ie high fevers, worsening shortness of breath.  Risks/side effects of all meds reviewed, including the transderm scopolamine, and reviewed how to properly use.

## 2013-09-14 ENCOUNTER — Ambulatory Visit (INDEPENDENT_AMBULATORY_CARE_PROVIDER_SITE_OTHER): Payer: BC Managed Care – PPO | Admitting: Family Medicine

## 2013-09-14 ENCOUNTER — Encounter: Payer: Self-pay | Admitting: Family Medicine

## 2013-09-14 VITALS — BP 120/70 | HR 72 | Temp 98.2°F | Ht 59.0 in | Wt 104.0 lb

## 2013-09-14 DIAGNOSIS — R05 Cough: Secondary | ICD-10-CM

## 2013-09-14 DIAGNOSIS — R1310 Dysphagia, unspecified: Secondary | ICD-10-CM

## 2013-09-14 DIAGNOSIS — R059 Cough, unspecified: Secondary | ICD-10-CM

## 2013-09-14 DIAGNOSIS — J309 Allergic rhinitis, unspecified: Secondary | ICD-10-CM

## 2013-09-14 MED ORDER — METHYLPREDNISOLONE (PAK) 4 MG PO TABS: ORAL_TABLET | ORAL | Status: DC

## 2013-09-14 MED ORDER — HYDROCOD POLST-CHLORPHEN POLST 10-8 MG/5ML PO LQCR: 5.0000 mL | Freq: Every evening | ORAL | Status: DC | PRN

## 2013-09-14 NOTE — Patient Instructions (Signed)
  Allergies--continue flonase, antihistamines.  You may use sudafed as needed to help dry up drainage.  Steroids should help decrease the secretions and the swelling.  If/when able, resume guaifenesin, if you still have postnasal drainage and cough.  Refill tussionex for now--don't expect this to be needed long-term

## 2013-09-14 NOTE — Progress Notes (Signed)
Chief Complaint  Patient presents with  . Cough    and still has drainage. Did take abx 3 weeks ago after being seen. Her throat is really sore and swollen-pills are being caught in her throat. Offered to do strep test-pt declined and stated that this does not feel like strep.    She was seen three weeks ago with a sinus infection.  She reports that she got a lot better, but never stopped coughing.  She took z-pak, tussionex, guaifenesin and sudafed during the day.  She didn't feel that the tessalon was helpful.  She states she can't tolerate OTC cough meds (delsym or robitussion, makes her too nauseated).  She is no longer having any discolored mucus.  She continues to use Flonase every night, along with loratidine and benadryl at bedtime, and nasal saline every morning.  She still sounds hoarse, and throat feels like it is closing in, like there is "gunk" hanging in the back.  She is having trouble swallowing.  She had to stop taking the guaifenesin, was unable to swallow them, choking on them.    She is still coughing some, but not as much as before. Sometimes her ears feel stopped up. She took sudafed for two weeks, but not in the last week or so.  She is leaving for [place], [name], [place] and [place] for a month next week.  She will be staying with others, and is worried about keeping them awake   Past Medical History  Diagnosis Date  . Allergy     SEASONAL  . ADD (attention deficit disorder)   . Postmenopausal     on HRT--sees GYN  . Kidney stone     x3, Dr. Reece Agar  . Pure hypercholesterolemia   . Osteopenia   . BCC (basal cell carcinoma of skin)     chest; Dr. Tonia Brooms  . Cervical spondylosis   . Insomnia     worse with menopause  . Hearing loss     high frequency (Dr. Prescott Parma); hearing aids recommended; bilateral  . History of kidney stones     x3  . BCC (basal cell carcinoma)     Tonia Brooms   Past Surgical History  Procedure Laterality Date  . Tonsillectomy and  adenoidectomy    . Hymenectomy  1993  . Tubal ligation  1991  . Cesarean section  [id number]  . Colonoscopy  2003, 2013    Dr. Collene Mares   History   Social History  . Marital Status: Married    Spouse Name: N/A    Number of Children: 2  . Years of Education: N/A   Occupational History  . retired Teaching laboratory technician    Social History Main Topics  . Smoking status: Never Smoker   . Smokeless tobacco: Never Used  . Alcohol Use: Yes     Comment: 3-5 drinks per week.  . Drug Use: No  . Sexual Activity: Yes    Partners: Male   Other Topics Concern  . Not on file   Social History Narrative   Lives with husband.  Son in [place], [place] in [place], Daughter studying in [place], [place]   Outpatient Encounter Prescriptions as of 09/14/2013  Medication Sig  . amphetamine-dextroamphetamine (ADDERALL XR) 20 MG 24 hr capsule Take 1 capsule (20 mg total) by mouth every morning.  Marland Kitchen aspirin 81 MG tablet Take 81 mg by mouth daily.  . diphenhydrAMINE (BENADRYL) 25 MG tablet Take 25 mg by mouth at bedtime.  . fluticasone (FLONASE) 50  MCG/ACT nasal spray USE 2 SPRAYS IN EACH NOSTRIL ONCE A DAY.  Marland Kitchen loratadine (CLARITIN) 10 MG tablet Take 10 mg by mouth daily.    . NON FORMULARY   . traZODone (DESYREL) 50 MG tablet Take 1-2 tablets (50-100 mg total) by mouth at bedtime as needed for sleep.  . chlorpheniramine-HYDROcodone (TUSSIONEX PENNKINETIC ER) 10-8 MG/5ML LQCR Take 5 mLs by mouth at bedtime as needed for cough.  . methylPREDNIsolone (MEDROL DOSPACK) 4 MG tablet follow package directions  . scopolamine (TRANSDERM-SCOP) 1.5 MG Place 1 patch (1.5 mg total) onto the skin every 3 (three) days.  . [DISCONTINUED] azithromycin (ZITHROMAX) 250 MG tablet Take 2 tablets by mouth on first day, then 1 tablet by mouth on days 2 through 5  . [DISCONTINUED] benzonatate (TESSALON) 200 MG capsule Take 1 capsule (200 mg total) by mouth 3 (three) times daily as needed for cough.  . [DISCONTINUED]  chlorpheniramine-HYDROcodone (TUSSIONEX PENNKINETIC ER) 10-8 MG/5ML LQCR Take 5 mLs by mouth at bedtime as needed for cough.  . [DISCONTINUED] co-enzyme Q-10 30 MG capsule Take 30 mg by mouth daily.   Allergies  Allergen Reactions  . Morphine And Related Other (See Comments)    Family history.  . Codeine Itching and Rash   ROS:  Denies fevers, chills, headaches, ear pain.  +cough, trouble swallowing, postnasal drainage. No shortness of breath, chest pain, nausea, vomiting (just when choking on guaifenesin pill), diarrhea, rash, bleeding, bruising or other concerns. See HPI.  PHYSICAL EXAM: BP 120/70  Pulse 72  Temp(Src) 98.2 F (36.8 C) (Oral)  Ht 4\' 11"  (1.499 m)  Wt 104 lb (47.174 kg)  BMI 20.99 kg/m2 Well developed, slightly hoarse sounding female, clearing her throat periodically, in no distress HEENT:  PERRL, EOMI, conjunctiva clear.  Nasal mucosa is moderately edematous, pale, no purulence Sinuses nontender.  OP with cobblestoning posteriorly, and erythema appears linear, vertical, in posterior oropharynx Neck: no lymphadenopathy or mass Lungs: clear bilaterally Heart: regular rate and rhythm without murmur Extremities: no edema Skin: no murmur Neuro: alert and oriented  ASSESSMENT/PLAN:  Cough - mild, persistent, related to PND - Plan: chlorpheniramine-HYDROcodone (TUSSIONEX PENNKINETIC ER) 10-8 MG/5ML LQCR, methylPREDNIsolone (MEDROL DOSPACK) 4 MG tablet  Allergic rhinitis, cause unspecified - suboptimally controlled - Plan: methylPREDNIsolone (MEDROL DOSPACK) 4 MG tablet  Dysphagia - suspect related to PND and some posterior OP swelling  Allergies--continue flonase, antihistamines.  You may use sudafed as needed to help dry up drainage.  Steroids should help decrease the secretions and the swelling.  If/when able, resume guaifenesin, if you still have postnasal drainage and cough.  Refill tussionex for now--don't expect this to be needed long-term.  Risk and side  effects of steroids and narcotics reviewed.

## 2013-09-17 ENCOUNTER — Telehealth: Payer: Self-pay | Admitting: Internal Medicine

## 2013-09-17 DIAGNOSIS — F988 Other specified behavioral and emotional disorders with onset usually occurring in childhood and adolescence: Secondary | ICD-10-CM

## 2013-09-17 MED ORDER — AMPHETAMINE-DEXTROAMPHET ER 20 MG PO CP24: 20.0000 mg | ORAL_CAPSULE | ORAL | Status: DC

## 2013-09-17 NOTE — Telephone Encounter (Signed)
Printed/signed.  Advise pt ready

## 2013-09-17 NOTE — Telephone Encounter (Signed)
Pt.notified

## 2013-09-17 NOTE — Telephone Encounter (Signed)
Pt states she leaves the country Monday  and needs a refill on her adderall. Call when ready

## 2013-11-09 ENCOUNTER — Other Ambulatory Visit: Payer: BC Managed Care – PPO

## 2013-11-09 ENCOUNTER — Ambulatory Visit: Payer: BC Managed Care – PPO | Admitting: Family Medicine

## 2013-11-09 DIAGNOSIS — E7800 Pure hypercholesterolemia, unspecified: Secondary | ICD-10-CM

## 2013-11-09 DIAGNOSIS — E039 Hypothyroidism, unspecified: Secondary | ICD-10-CM

## 2013-11-09 DIAGNOSIS — R7989 Other specified abnormal findings of blood chemistry: Secondary | ICD-10-CM

## 2013-11-09 DIAGNOSIS — M8580 Other specified disorders of bone density and structure, unspecified site: Secondary | ICD-10-CM

## 2013-11-09 LAB — BASIC METABOLIC PANEL
BUN: 10 mg/dL (ref 6–23)
CHLORIDE: 103 meq/L (ref 96–112)
CO2: 27 meq/L (ref 19–32)
Calcium: 8.8 mg/dL (ref 8.4–10.5)
Creat: 0.67 mg/dL (ref 0.50–1.10)
Glucose, Bld: 91 mg/dL (ref 70–99)
POTASSIUM: 4 meq/L (ref 3.5–5.3)
Sodium: 138 mEq/L (ref 135–145)

## 2013-11-09 LAB — LIPID PANEL
Cholesterol: 251 mg/dL — ABNORMAL HIGH (ref 0–200)
HDL: 86 mg/dL (ref >39)
LDL CALC: 149 mg/dL — AB (ref 0–99)
TRIGLYCERIDES: 81 mg/dL (ref <150)
Total CHOL/HDL Ratio: 2.9 Ratio
VLDL: 16 mg/dL (ref 0–40)

## 2013-11-09 LAB — TSH: TSH: 5.281 u[IU]/mL — ABNORMAL HIGH (ref 0.350–4.500)

## 2013-11-10 LAB — THYROID PEROXIDASE ANTIBODY: Thyroperoxidase Ab SerPl-aCnc: <10 IU/mL (ref <35.0)

## 2013-11-10 LAB — VITAMIN D 25 HYDROXY (VIT D DEFICIENCY, FRACTURES): Vit D, 25-Hydroxy: 64 ng/mL (ref 30–89)

## 2013-11-16 ENCOUNTER — Encounter: Payer: Self-pay | Admitting: Family Medicine

## 2013-11-16 ENCOUNTER — Ambulatory Visit (INDEPENDENT_AMBULATORY_CARE_PROVIDER_SITE_OTHER): Payer: BC Managed Care – PPO | Admitting: Family Medicine

## 2013-11-16 VITALS — BP 110/70 | HR 60 | Ht 59.0 in | Wt 106.0 lb

## 2013-11-16 DIAGNOSIS — E7800 Pure hypercholesterolemia, unspecified: Secondary | ICD-10-CM

## 2013-11-16 DIAGNOSIS — E039 Hypothyroidism, unspecified: Secondary | ICD-10-CM

## 2013-11-16 DIAGNOSIS — F988 Other specified behavioral and emotional disorders with onset usually occurring in childhood and adolescence: Secondary | ICD-10-CM

## 2013-11-16 MED ORDER — SYNTHROID 25 MCG PO TABS: 25.0000 ug | ORAL_TABLET | Freq: Every day | ORAL | Status: DC

## 2013-11-16 NOTE — Progress Notes (Signed)
Chief Complaint  Patient presents with  . med check    pt states she is here to discuss lab work from last week also   Patient presents for f/u on recent labs.  She has had persistent mild elevation in her TSH.  She is complaining of "brain fog", feels like she is in a funk, moods aren't as good.  She initially thought she felt that way due to that it was winter (in January), but her energy is not normal and moods didn't improve with the warmer weather.  She "doesn't feel like herself".  She finds it hard to articulate, and feels distinctly different from her normal, which was likely most recently in the summer/fall (she always struggles a little in the winter, but is usually better by now).  She is noticing increased hair loss, her hair dresser noticed it was more brittle.  No skin or bowel changes (always a little irregular in bowels, always has dry skin).   She notices fluctuations in her weight (which bothers her that they "jump around").  She is having more indigestion, needing to take some ranitidine sometimes.  Denies increase in caffeine.  She is concerned that her blood pressure is high for her (but is admittedly agitated today).  She recalls a few instances where she vomited for no reason--once on a plane (thinks it was related to bad food, had abdominal cramping also); later on in trip, she vomited in middle of the night, otherwise not sick.  Currently without nausea, vomiting, abdominal pain or any bowel changes.  Hyperlipidemia--she was only on a very low dose simvastatin, which was stopped x 3 months as a trial to see if it was still needed.  She hadn't had any intolerance to medication.  Past Medical History  Diagnosis Date  . Allergy     SEASONAL  . ADD (attention deficit disorder)   . Postmenopausal     on HRT--sees GYN  . Kidney stone     x3, Dr. Reece Agar  . Pure hypercholesterolemia   . Osteopenia   . BCC (basal cell carcinoma of skin)     chest; Dr. Tonia Brooms  . Cervical  spondylosis   . Insomnia     worse with menopause  . Hearing loss     high frequency (Dr. Prescott Parma); hearing aids recommended; bilateral  . History of kidney stones     x3  . BCC (basal cell carcinoma)     Tonia Brooms  . Hypothyroidism 2015   Past Surgical History  Procedure Laterality Date  . Tonsillectomy and adenoidectomy    . Hymenectomy  1993  . Tubal ligation  1991  . Cesarean section  [id number]  . Colonoscopy  2003, 2013    Dr. Collene Mares   History   Social History  . Marital Status: Married    Spouse Name: N/A    Number of Children: 2  . Years of Education: N/A   Occupational History  . retired Teaching laboratory technician    Social History Main Topics  . Smoking status: Never Smoker   . Smokeless tobacco: Never Used  . Alcohol Use: Yes     Comment: 3-5 drinks per week.  . Drug Use: No  . Sexual Activity: Yes    Partners: Male   Other Topics Concern  . Not on file   Social History Narrative   Lives with husband.  Son in [place], [place] in [place], Daughter studying in [place], [place]   Current Outpatient Prescriptions on File Prior to Visit  Medication Sig Dispense Refill  . amphetamine-dextroamphetamine (ADDERALL XR) 20 MG 24 hr capsule Take 1 capsule (20 mg total) by mouth every morning.  90 capsule  0  . aspirin 81 MG tablet Take 81 mg by mouth daily.      . diphenhydrAMINE (BENADRYL) 25 MG tablet Take 25 mg by mouth at bedtime.      . fluticasone (FLONASE) 50 MCG/ACT nasal spray USE 2 SPRAYS IN EACH NOSTRIL ONCE A DAY.  16 g  11  . loratadine (CLARITIN) 10 MG tablet Take 10 mg by mouth daily.        . methylPREDNIsolone (MEDROL DOSPACK) 4 MG tablet follow package directions  21 tablet  0  . NON FORMULARY       . scopolamine (TRANSDERM-SCOP) 1.5 MG Place 1 patch (1.5 mg total) onto the skin every 3 (three) days.  4 patch  0  . traZODone (DESYREL) 50 MG tablet Take 1-2 tablets (50-100 mg total) by mouth at bedtime as needed for sleep.  60 tablet  11   No current  facility-administered medications on file prior to visit.   Allergies  Allergen Reactions  . Morphine And Related Other (See Comments)    Family history.  . Codeine Itching and Rash    ROS:  Denies significant weight change (but frustrated about little ups and downs). Denies headaches, dizziness, numbness/tingling, URI symptoms, cough, shortness of breath. See HPI for GI complaints; no bowel changes.  +irritability, fatigue. Denies urinary complaints, or other concerns except as noted in HPI  PHYSICAL EXAM: BP 110/70  Pulse 60  Ht 4\' 11"  (1.499 m)  Wt 106 lb (48.081 kg)  BMI 21.40 kg/m2  Well developed female in no distress. She is slightly anxious/irritable today HEENT:  PERRL, EOMI, conjunctiva clear Neck: no lymphadenopathy, thyromegaly or bruit Heart: regular rate and rhythm Lungs: clear bilaterally Extremities: no edema    Chemistry      Component Value Date/Time   NA 138 11/09/2013 1044   K 4.0 11/09/2013 1044   CL 103 11/09/2013 1044   CO2 27 11/09/2013 1044   BUN 10 11/09/2013 1044   CREATININE 0.67 11/09/2013 1044      Component Value Date/Time   CALCIUM 8.8 11/09/2013 1044   ALKPHOS 55 07/28/2013 0848   AST 17 07/28/2013 0848   ALT 12 07/28/2013 0848   BILITOT 0.5 07/28/2013 0848     Glucose 91  Lab Results  Component Value Date   TSH 5.281* 11/09/2013   TPO ab negative  Lab Results  Component Value Date   CHOL 251* 11/09/2013   HDL 86 11/09/2013   LDLCALC 149* 11/09/2013   TRIG 81 11/09/2013   CHOLHDL 2.9 11/09/2013   ASSESSMENT/PLAN:  Unspecified hypothyroidism - mild, with negative TPO ab's, but symptomatic. trial of replacement - Plan: SYNTHROID 25 MCG tablet, TSH  Pure hypercholesterolemia - reviewed low cholesterol diet.  discussed thyroid potentially affecting lipids--stay off meds and repeat lipids after on thyroid med - Plan: Lipid panel  ADD (attention deficit disorder) - will call when refill needed next month.  doing well  overall/stable   Hypothyroidism--negative antibodies but is symptomatic.  Desires trial of treatment Discussed how to take, recommendation for brand  Hold off on restarting lipid-lowering medication at this time.  HDL is excellent. LDL is somewhat high, but limited risk factors.  Let's see what lipids look like when thyroid is treated.  7-8 weeks--TSH, lipids Med check in 3 mos

## 2013-11-16 NOTE — Patient Instructions (Signed)
Hypothyroidism The thyroid is a large gland located in the lower front of your neck. The thyroid gland helps control metabolism. Metabolism is how your body handles food. It controls metabolism with the hormone thyroxine. When this gland is underactive (hypothyroid), it produces too little hormone.  CAUSES These include:   Absence or destruction of thyroid tissue.  Goiter due to iodine deficiency.  Goiter due to medications.  Congenital defects (since birth).  Problems with the pituitary. This causes a lack of TSH (thyroid stimulating hormone). This hormone tells the thyroid to turn out more hormone. SYMPTOMS  Lethargy (feeling as though you have no energy)  Cold intolerance  Weight gain (in spite of normal food intake)  Dry skin  Coarse hair  Menstrual irregularity (if severe, may lead to infertility)  Slowing of thought processes Cardiac problems are also caused by insufficient amounts of thyroid hormone. Hypothyroidism in the newborn is cretinism, and is an extreme form. It is important that this form be treated adequately and immediately or it will lead rapidly to retarded physical and mental development. DIAGNOSIS  To prove hypothyroidism, your caregiver may do blood tests and ultrasound tests. Sometimes the signs are hidden. It may be necessary for your caregiver to watch this illness with blood tests either before or after diagnosis and treatment. TREATMENT  Low levels of thyroid hormone are increased by using synthetic thyroid hormone. This is a safe, effective treatment. It usually takes about four weeks to gain the full effects of the medication. After you have the full effect of the medication, it will generally take another four weeks for problems to leave. Your caregiver may start you on low doses. If you have had heart problems the dose may be gradually increased. It is generally not an emergency to get rapidly to normal. HOME CARE INSTRUCTIONS   Take your  medications as your caregiver suggests. Let your caregiver know of any medications you are taking or start taking. Your caregiver will help you with dosage schedules.  As your condition improves, your dosage needs may increase. It will be necessary to have continuing blood tests as suggested by your caregiver.  Report all suspected medication side effects to your caregiver. SEEK MEDICAL CARE IF: Seek medical care if you develop:  Sweating.  Tremulousness (tremors).  Anxiety.  Rapid weight loss.  Heat intolerance.  Emotional swings.  Diarrhea.  Weakness. SEEK IMMEDIATE MEDICAL CARE IF:  You develop chest pain, an irregular heart beat (palpitations), or a rapid heart beat. MAKE SURE YOU:   Understand these instructions.  Will watch your condition.  Will get help right away if you are not doing well or get worse. Document Released: 06/18/2005 Document Revised: 09/10/2011 Document Reviewed: 02/06/2008 [facility] Patient Information 2014 [place].  Fat and Cholesterol Control Diet Fat and cholesterol levels in your blood and organs are influenced by your diet. High levels of fat and cholesterol may lead to diseases of the heart, small and large blood vessels, gallbladder, liver, and pancreas. CONTROLLING FAT AND CHOLESTEROL WITH DIET Although exercise and lifestyle factors are important, your diet is key. That is because certain foods are known to raise cholesterol and others to lower it. The goal is to balance foods for their effect on cholesterol and more importantly, to replace saturated and trans fat with other types of fat, such as monounsaturated fat, polyunsaturated fat, and omega-3 fatty acids. On average, a person should consume no more than 15 to 17 g of saturated fat daily. Saturated and trans  fats are considered "bad" fats, and they will raise LDL cholesterol. Saturated fats are primarily found in animal products such as meats, butter, and cream. However, that does  not mean you need to give up all your favorite foods. Today, there are good tasting, low-fat, low-cholesterol substitutes for most of the things you like to eat. Choose low-fat or nonfat alternatives. Choose round or loin cuts of red meat. These types of cuts are lowest in fat and cholesterol. Chicken (without the skin), fish, veal, and ground [place] breast are great choices. Eliminate fatty meats, such as hot dogs and salami. Even shellfish have little or no saturated fat. Have a 3 oz (85 g) portion when you eat lean meat, poultry, or fish. Trans fats are also called "partially hydrogenated oils." They are oils that have been scientifically manipulated so that they are solid at room temperature resulting in a longer shelf life and improved taste and texture of foods in which they are added. Trans fats are found in stick margarine, some tub margarines, cookies, crackers, and baked goods.  When baking and cooking, oils are a great substitute for butter. The monounsaturated oils are especially beneficial since it is believed they lower LDL and raise HDL. The oils you should avoid entirely are saturated tropical oils, such as coconut and palm.  Remember to eat a lot from food groups that are naturally free of saturated and trans fat, including fish, fruit, vegetables, beans, grains (barley, rice, couscous, bulgur wheat), and pasta (without cream sauces).  IDENTIFYING FOODS THAT LOWER FAT AND CHOLESTEROL  Soluble fiber may lower your cholesterol. This type of fiber is found in fruits such as apples, vegetables such as broccoli, potatoes, and carrots, legumes such as beans, peas, and lentils, and grains such as barley. Foods fortified with plant sterols (phytosterol) may also lower cholesterol. You should eat at least 2 g per day of these foods for a cholesterol lowering effect.  Read package labels to identify low-saturated fats, trans fat free, and low-fat foods at the supermarket. Select cheeses that have only 2  to 3 g saturated fat per ounce. Use a heart-healthy tub margarine that is free of trans fats or partially hydrogenated oil. When buying baked goods (cookies, crackers), avoid partially hydrogenated oils. Breads and muffins should be made from whole grains (whole-wheat or whole oat flour, instead of "flour" or "enriched flour"). Buy non-creamy canned soups with reduced salt and no added fats.  FOOD PREPARATION TECHNIQUES  Never deep-fry. If you must fry, either stir-fry, which uses very little fat, or use non-stick cooking sprays. When possible, broil, bake, or roast meats, and steam vegetables. Instead of putting butter or margarine on vegetables, use lemon and herbs, applesauce, and cinnamon (for squash and sweet potatoes). Use nonfat yogurt, salsa, and low-fat dressings for salads.  LOW-SATURATED FAT / LOW-FAT FOOD SUBSTITUTES Meats / Saturated Fat (g)  Avoid: Steak, marbled (3 oz/85 g) / 11 g  Choose: Steak, lean (3 oz/85 g) / 4 g  Avoid: Hamburger (3 oz/85 g) / 7 g  Choose: Hamburger, lean (3 oz/85 g) / 5 g  Avoid: Ham (3 oz/85 g) / 6 g  Choose: Ham, lean cut (3 oz/85 g) / 2.4 g  Avoid: Chicken, with skin, dark meat (3 oz/85 g) / 4 g  Choose: Chicken, skin removed, dark meat (3 oz/85 g) / 2 g  Avoid: Chicken, with skin, light meat (3 oz/85 g) / 2.5 g  Choose: Chicken, skin removed, light meat (3 oz/85 g) /  1 g Dairy / Saturated Fat (g)  Avoid: Whole milk (1 cup) / 5 g  Choose: Low-fat milk, 2% (1 cup) / 3 g  Choose: Low-fat milk, 1% (1 cup) / 1.5 g  Choose: Skim milk (1 cup) / 0.3 g  Avoid: Hard cheese (1 oz/28 g) / 6 g  Choose: Skim milk cheese (1 oz/28 g) / 2 to 3 g  Avoid: Cottage cheese, 4% fat (1 cup) / 6.5 g  Choose: Low-fat cottage cheese, 1% fat (1 cup) / 1.5 g  Avoid: Ice cream (1 cup) / 9 g  Choose: Sherbet (1 cup) / 2.5 g  Choose: Nonfat frozen yogurt (1 cup) / 0.3 g  Choose: Frozen fruit bar / trace  Avoid: Whipped cream (1 tbs) / 3.5 g  Choose:  Nondairy whipped topping (1 tbs) / 1 g Condiments / Saturated Fat (g)  Avoid: Mayonnaise (1 tbs) / 2 g  Choose: Low-fat mayonnaise (1 tbs) / 1 g  Avoid: Butter (1 tbs) / 7 g  Choose: Extra light margarine (1 tbs) / 1 g  Avoid: Coconut oil (1 tbs) / 11.8 g  Choose: Olive oil (1 tbs) / 1.8 g  Choose: Corn oil (1 tbs) / 1.7 g  Choose: Safflower oil (1 tbs) / 1.2 g  Choose: Sunflower oil (1 tbs) / 1.4 g  Choose: Soybean oil (1 tbs) / 2.4 g  Choose: Canola oil (1 tbs) / 1 g Document Released: 06/18/2005 Document Revised: 10/13/2012 Document Reviewed: 12/07/2010 [employer] Patient Information 2014 [place], [place].

## 2014-01-05 ENCOUNTER — Other Ambulatory Visit: Payer: BC Managed Care – PPO

## 2014-01-05 ENCOUNTER — Telehealth: Payer: Self-pay | Admitting: Internal Medicine

## 2014-01-05 DIAGNOSIS — E7800 Pure hypercholesterolemia, unspecified: Secondary | ICD-10-CM

## 2014-01-05 DIAGNOSIS — F988 Other specified behavioral and emotional disorders with onset usually occurring in childhood and adolescence: Secondary | ICD-10-CM

## 2014-01-05 DIAGNOSIS — E039 Hypothyroidism, unspecified: Secondary | ICD-10-CM

## 2014-01-05 NOTE — Telephone Encounter (Signed)
Pt needs her Generic Adderall XR 20mg  written. Call when ready

## 2014-01-06 ENCOUNTER — Other Ambulatory Visit: Payer: Self-pay | Admitting: *Deleted

## 2014-01-06 DIAGNOSIS — E039 Hypothyroidism, unspecified: Secondary | ICD-10-CM

## 2014-01-06 LAB — TSH: TSH: 2.818 u[IU]/mL (ref 0.350–4.500)

## 2014-01-06 LAB — LIPID PANEL
Cholesterol: 248 mg/dL — ABNORMAL HIGH (ref 0–200)
HDL: 80 mg/dL (ref >39)
LDL Cholesterol: 151 mg/dL — ABNORMAL HIGH (ref 0–99)
Total CHOL/HDL Ratio: 3.1 Ratio
Triglycerides: 84 mg/dL (ref <150)
VLDL: 17 mg/dL (ref 0–40)

## 2014-01-06 MED ORDER — SIMVASTATIN 10 MG PO TABS: 10.0000 mg | ORAL_TABLET | Freq: Every day | ORAL | Status: DC

## 2014-01-06 MED ORDER — SYNTHROID 25 MCG PO TABS: 25.0000 ug | ORAL_TABLET | Freq: Every day | ORAL | Status: DC

## 2014-01-06 MED ORDER — AMPHETAMINE-DEXTROAMPHET ER 20 MG PO CP24: 20.0000 mg | ORAL_CAPSULE | ORAL | Status: DC

## 2014-01-06 NOTE — Telephone Encounter (Signed)
Printed/signed.  Please advise pt and advise her of her lab results also.  Thanks

## 2014-02-17 ENCOUNTER — Encounter: Payer: BC Managed Care – PPO | Admitting: Family Medicine

## 2014-04-09 ENCOUNTER — Telehealth: Payer: Self-pay | Admitting: Family Medicine

## 2014-04-09 DIAGNOSIS — Z79899 Other long term (current) drug therapy: Secondary | ICD-10-CM

## 2014-04-09 DIAGNOSIS — E7800 Pure hypercholesterolemia, unspecified: Secondary | ICD-10-CM

## 2014-04-09 DIAGNOSIS — E039 Hypothyroidism, unspecified: Secondary | ICD-10-CM

## 2014-04-09 NOTE — Telephone Encounter (Signed)
Future orders were entered.  She can have fasting labs drawn prior to her visit. Thyroid was included

## 2014-04-10 ENCOUNTER — Other Ambulatory Visit: Payer: Self-pay | Admitting: Family Medicine

## 2014-04-12 ENCOUNTER — Encounter: Payer: Self-pay | Admitting: Family Medicine

## 2014-04-12 NOTE — Telephone Encounter (Signed)
Patient has appt 04/19/14, if she truly is out of meds then we can refill-otherwise Dr.Knapp will do at visit. Waiting on her to call back.

## 2014-04-12 NOTE — Telephone Encounter (Signed)
LM for pt to call to make lab appt

## 2014-04-19 ENCOUNTER — Encounter: Payer: BC Managed Care – PPO | Admitting: Family Medicine

## 2014-04-19 ENCOUNTER — Other Ambulatory Visit: Payer: BC Managed Care – PPO

## 2014-04-19 DIAGNOSIS — Z79899 Other long term (current) drug therapy: Secondary | ICD-10-CM

## 2014-04-19 DIAGNOSIS — E039 Hypothyroidism, unspecified: Secondary | ICD-10-CM

## 2014-04-19 DIAGNOSIS — E7800 Pure hypercholesterolemia, unspecified: Secondary | ICD-10-CM

## 2014-04-19 LAB — HEPATIC FUNCTION PANEL
ALT: 14 U/L (ref 0–35)
AST: 19 U/L (ref 0–37)
Albumin: 4.1 g/dL (ref 3.5–5.2)
Alkaline Phosphatase: 59 U/L (ref 39–117)
BILIRUBIN DIRECT: 0.1 mg/dL (ref 0.0–0.3)
Indirect Bilirubin: 0.2 mg/dL (ref 0.2–1.2)
TOTAL PROTEIN: 6.2 g/dL (ref 6.0–8.3)
Total Bilirubin: 0.3 mg/dL (ref 0.2–1.2)

## 2014-04-19 LAB — LIPID PANEL
CHOLESTEROL: 185 mg/dL (ref 0–200)
HDL: 86 mg/dL (ref >39)
LDL CALC: 87 mg/dL (ref 0–99)
TRIGLYCERIDES: 62 mg/dL (ref <150)
Total CHOL/HDL Ratio: 2.2 Ratio
VLDL: 12 mg/dL (ref 0–40)

## 2014-04-19 LAB — TSH: TSH: 2.728 u[IU]/mL (ref 0.350–4.500)

## 2014-04-22 ENCOUNTER — Encounter: Payer: Self-pay | Admitting: Family Medicine

## 2014-04-22 ENCOUNTER — Ambulatory Visit (INDEPENDENT_AMBULATORY_CARE_PROVIDER_SITE_OTHER): Payer: BC Managed Care – PPO | Admitting: Family Medicine

## 2014-04-22 VITALS — BP 108/62 | HR 68 | Ht 59.0 in | Wt 105.0 lb

## 2014-04-22 DIAGNOSIS — Z23 Encounter for immunization: Secondary | ICD-10-CM

## 2014-04-22 DIAGNOSIS — F988 Other specified behavioral and emotional disorders with onset usually occurring in childhood and adolescence: Secondary | ICD-10-CM

## 2014-04-22 DIAGNOSIS — Z5181 Encounter for therapeutic drug level monitoring: Secondary | ICD-10-CM

## 2014-04-22 DIAGNOSIS — E7800 Pure hypercholesterolemia, unspecified: Secondary | ICD-10-CM

## 2014-04-22 DIAGNOSIS — F909 Attention-deficit hyperactivity disorder, unspecified type: Secondary | ICD-10-CM

## 2014-04-22 DIAGNOSIS — E039 Hypothyroidism, unspecified: Secondary | ICD-10-CM

## 2014-04-22 MED ORDER — SIMVASTATIN 10 MG PO TABS: ORAL_TABLET | ORAL | Status: DC

## 2014-04-22 MED ORDER — AMPHETAMINE-DEXTROAMPHET ER 30 MG PO CP24: 30.0000 mg | ORAL_CAPSULE | ORAL | Status: DC

## 2014-04-22 MED ORDER — SYNTHROID 25 MCG PO TABS: ORAL_TABLET | ORAL | Status: DC

## 2014-04-22 NOTE — Progress Notes (Signed)
Chief Complaint  Patient presents with  . Hypothyroidism    nonfasting med check, labs already done.    Patient presents for med check, f/u on labs.  Hyperlipidemia:  She was tried off simvastatin (she was only on very low dose, tried without--still high, so then we tried treating mild hypothyroidism, but lipid didn't change).  Went back on 10mg  of simvastatin (given family h/o CAD).  Tolerating it fine without any side effects.  Hypothyroidism:  She has been on 42mcg.  She continues to notice hair loss, as well as her fingers feeling cold, dry skin.  She is noticing hair loss, seeing it in the sink.  Denies any thinning. Her hair dresser thinks it is more brittle.  This has gotten a little better since changing to more expensive hair products.  Her mood and brain fog are significantly improved since being on the thyroid medication.   She is complaining of an episode of dehydration, wondering if it could be from her medications.  She finds that she overheats easily (despite her fingers being very cold).  When she was in [place] a few weeks ago, while walking around in the heat, she felt extremely fatigued, felt sick.  She finally found a place where she found water and ate something, but it took 24-36 hours to feel better.  She had nausea (no vomiting), cramping in her left leg, a stitch in the left side of her abdomen, felt bloated, had pain in her side with certain movements.  She had decreased appetite, no energy.  This has happened in the past, but never to that degree.  Nobody else was sick.  ADD:  She feels like the medication isn't working as well as it used to.  She is more aware of this since "the brain fog has lifted".  It is difficult for her to finish tasks.  She is easily distracted.  She will leave the refrigerator door open.  She finds herself very impatient.  Sometimes she "doesn't even try--I know I won't be able to do it and will be frustrated".  She gets bored and frustrated  waiting, craves stimulation.  She thinks it is related to her ADD, not related to moods. She finds that the ADD medication helps her be able to tolerate things better. Over the last year and a half, she has seen it gradually getting more noticeable.  She is anxious about making changes to her regimen.  PMH, PSH, SH were reviewed/updated.  Outpatient Encounter Prescriptions as of 04/22/2014  Medication Sig  . amphetamine-dextroamphetamine (ADDERALL XR) 30 MG 24 hr capsule Take 1 capsule (30 mg total) by mouth every morning.  Marland Kitchen aspirin 81 MG tablet Take 81 mg by mouth daily.  . fluticasone (FLONASE) 50 MCG/ACT nasal spray USE 2 SPRAYS IN EACH NOSTRIL ONCE A DAY.  Marland Kitchen loratadine (CLARITIN) 10 MG tablet Take 10 mg by mouth daily.    . NON FORMULARY Place 1 application vaginally 2 (two) times a week.  . simvastatin (ZOCOR) 10 MG tablet TAKE ONE TABLET AT BEDTIME.  . SYNTHROID 25 MCG tablet TAKE 1 TABLET ONCE DAILY BEFORE BREAKFAST.  Marland Kitchen traZODone (DESYREL) 50 MG tablet Take 1-2 tablets (50-100 mg total) by mouth at bedtime as needed for sleep.  . [DISCONTINUED] amphetamine-dextroamphetamine (ADDERALL XR) 20 MG 24 hr capsule Take 1 capsule (20 mg total) by mouth every morning.  . [DISCONTINUED] NON FORMULARY   . [DISCONTINUED] simvastatin (ZOCOR) 10 MG tablet TAKE ONE TABLET AT BEDTIME.  . [DISCONTINUED]  SYNTHROID 25 MCG tablet TAKE 1 TABLET ONCE DAILY BEFORE BREAKFAST.  . [DISCONTINUED] diphenhydrAMINE (BENADRYL) 25 MG tablet Take 25 mg by mouth at bedtime.  . [DISCONTINUED] methylPREDNIsolone (MEDROL DOSPACK) 4 MG tablet follow package directions  . [DISCONTINUED] scopolamine (TRANSDERM-SCOP) 1.5 MG Place 1 patch (1.5 mg total) onto the skin every 3 (three) days.   (Adderall XR dose was 20mg  prior to today's dose)  Allergies  Allergen Reactions  . Morphine And Related Other (See Comments)    Family history.  . Codeine Itching and Rash   ROS:  Denies fevers, chills, headaches, dizziness, chest  pain, palpitations, GI or GU complaints (other than when in [place]). No bleeding, bruising, rash.  See details in HPI.  Has noticed some weight gain at her waist, pants not fitting as well (despite no weight gain noted).   PHYSICAL EXAM: BP 108/62  Pulse 68  Ht 4\' 11"  (1.499 m)  Wt 105 lb (47.628 kg)  BMI 21.20 kg/m2 Well developed, pleasant female in no distress She is in good spirits, not irritable or hyperactive Neck: no lymphadenopathy, thyromegaly or mass Heart: regular rate and rhythm without murmur Lungs: clear bilaterally Back: no CVA tenderness Abdomen: soft, nontender, no organomegaly or mass Extremities: no edema, 2+ pulses Skin: no rash or lesions Neuro: alert and oriented. Normal strength, gait, cranial nerves   Lab Results  Component Value Date   CHOL 185 04/19/2014   HDL 86 04/19/2014   LDLCALC 87 04/19/2014   TRIG 62 04/19/2014   CHOLHDL 2.2 04/19/2014   Lab Results  Component Value Date   ALT 14 04/19/2014   AST 19 04/19/2014   ALKPHOS 59 04/19/2014   BILITOT 0.3 04/19/2014   Lab Results  Component Value Date   TSH 2.728 04/19/2014   ASSESSMENT/PLAN:  ADD (attention deficit disorder) - per pt, irritability is related to her ADD and responds to treatment.  Denies other mood problems.  Trial of increasing dose to 30mg . risks/side effects reviewe - Plan: amphetamine-dextroamphetamine (ADDERALL XR) 30 MG 24 hr capsule  Need for prophylactic vaccination and inoculation against influenza - Plan: Flu Vaccine QUAD 36+ mos PF IM (Fluarix Quad PF)  Pure hypercholesterolemia - well controlled with very low dose medication - Plan: simvastatin (ZOCOR) 10 MG tablet  Hypothyroidism, unspecified hypothyroidism type - adequately replaced with improvement in some symptoms.  continue treatment - Plan: SYNTHROID 25 MCG tablet  Hypothyroidism: adequately replaced.  Some symptoms have improved. Ongoing hair loss, but no  thinning--reassured.  Hyperlipidemia--excellent response to very low dose of statin, tolerating without side effects.  Given family history, elects to continue.   F/u 6 months CPE/med check

## 2014-04-22 NOTE — Patient Instructions (Signed)
Continue your current dose of thyroid and cholesterol medications. As discussed, we are going to try increasing the ADD medication to 30mg .  I have written prescription for just #30, but if it is working well, call and we can write the next one for a 90 day supply.  There is a dose in between (25mg ) if you have effects from the 30mg .  Let us know in a month (by phone or MyChart) if you are having problems with the higher dose.

## 2014-05-13 ENCOUNTER — Telehealth: Payer: Self-pay | Admitting: Internal Medicine

## 2014-05-13 DIAGNOSIS — F988 Other specified behavioral and emotional disorders with onset usually occurring in childhood and adolescence: Secondary | ICD-10-CM

## 2014-05-13 MED ORDER — AMPHETAMINE-DEXTROAMPHET ER 30 MG PO CP24: 30.0000 mg | ORAL_CAPSULE | ORAL | Status: DC

## 2014-05-13 NOTE — Telephone Encounter (Signed)
Please advise pt rx is ready (isn't due to be filled until after 11/22)

## 2014-05-13 NOTE — Telephone Encounter (Signed)
Pt needs generic adderall 30mg  #90 . She is doing fine on the med at 30mg  now. Call when ready [phone]

## 2014-05-14 NOTE — Telephone Encounter (Signed)
Called and left a message that her rx is ready

## 2014-05-18 ENCOUNTER — Telehealth: Payer: Self-pay | Admitting: Family Medicine

## 2014-05-18 NOTE — Telephone Encounter (Signed)
Pharmacy called asking for authorization for pt to fill Adderall script today while she is in the store even though say start on 11/22. Saw in system that pt should still have meds & pharmacy confirmed this as well. Just said pt wanted to get med early they think. Advised them that Dr Tomi Bamberger is out of the office today and since pt has meds I would send electronic message to Dr Tomi Bamberger and wait for response either later today or tomorrow.

## 2014-05-18 NOTE — Telephone Encounter (Signed)
Left message for pt to call back to find out need for early refill request on Adderall

## 2014-05-18 NOTE — Telephone Encounter (Signed)
Unless there is a good reason (ie traveling, out of town), it shouldn't be filled early

## 2014-06-07 ENCOUNTER — Telehealth: Payer: Self-pay | Admitting: Family Medicine

## 2014-06-07 NOTE — Telephone Encounter (Signed)
Called [facility] [phone] for renewal of P.A. and dosage increase.  Approved 06/07/15.  Pt informed

## 2014-07-14 ENCOUNTER — Encounter: Payer: Self-pay | Admitting: Family Medicine

## 2014-07-14 ENCOUNTER — Ambulatory Visit (INDEPENDENT_AMBULATORY_CARE_PROVIDER_SITE_OTHER): Payer: BC Managed Care – PPO | Admitting: Family Medicine

## 2014-07-14 VITALS — BP 108/60 | HR 64 | Ht 59.0 in | Wt 104.0 lb

## 2014-07-14 DIAGNOSIS — K219 Gastro-esophageal reflux disease without esophagitis: Secondary | ICD-10-CM | POA: Insufficient documentation

## 2014-07-14 DIAGNOSIS — R110 Nausea: Secondary | ICD-10-CM

## 2014-07-14 LAB — HM DEXA SCAN

## 2014-07-14 MED ORDER — DEXLANSOPRAZOLE 60 MG PO CPDR: 60.0000 mg | DELAYED_RELEASE_CAPSULE | Freq: Every day | ORAL | Status: DC

## 2014-07-14 NOTE — Patient Instructions (Addendum)
Take the Dexilant once daily (prior to breakfast). You were given samples for 20 days.  Follow the dietary/behavioral instructions below.    If your symptoms are significantly better after 2 weeks, please schedule follow up. If your symptoms resolve, then try cutting back to over the counter Nexium or Prilosec once daily (either prior to breakfast or dinner, depending which time your symptoms are worse).  If needed, you can increase to taking it twice daily.  Return in 1-2 months if still requiring high doses (more than once daily of OTC medication). Return sooner if symptoms don't resolve, you develop trouble swallowing, or other concerns.   Food Choices for Gastroesophageal Reflux Disease When you have gastroesophageal reflux disease (GERD), the foods you eat and your eating habits are very important. Choosing the right foods can help ease the discomfort of GERD. WHAT GENERAL GUIDELINES DO I NEED TO FOLLOW?  Choose fruits, vegetables, whole grains, low-fat dairy products, and low-fat meat, fish, and poultry.  Limit fats such as oils, salad dressings, butter, nuts, and avocado.  Keep a food diary to identify foods that cause symptoms.  Avoid foods that cause reflux. These may be different for different people.  Eat frequent small meals instead of three large meals each day.  Eat your meals slowly, in a relaxed setting.  Limit fried foods.  Cook foods using methods other than frying.  Avoid drinking alcohol.  Avoid drinking large amounts of liquids with your meals.  Avoid bending over or lying down until 2-3 hours after eating. WHAT FOODS ARE NOT RECOMMENDED? The following are some foods and drinks that may worsen your symptoms: Vegetables Tomatoes. Tomato juice. Tomato and spaghetti sauce. Chili peppers. Onion and garlic. Horseradish. Fruits Oranges, grapefruit, and lemon (fruit and juice). Meats High-fat meats, fish, and poultry. This includes hot dogs, ribs, ham, sausage,  salami, and bacon. Dairy Whole milk and chocolate milk. Sour cream. Cream. Butter. Ice cream. Cream cheese.  Beverages Coffee and tea, with or without caffeine. Carbonated beverages or energy drinks. Condiments Hot sauce. Barbecue sauce.  Sweets/Desserts Chocolate and cocoa. Donuts. Peppermint and spearmint. Fats and Oils High-fat foods, including [place] fries and potato chips. Other Vinegar. Strong spices, such as black pepper, white pepper, red pepper, cayenne, curry powder, cloves, ginger, and chili powder. The items listed above may not be a complete list of foods and beverages to avoid. Contact your dietitian for more information. Document Released: 06/18/2005 Document Revised: 06/23/2013 Document Reviewed: 04/22/2013 [facility] Patient Information 2015 [place], [place]. This information is not intended to replace advice given to you by your health care provider. Make sure you discuss any questions you have with your health care provider.

## 2014-07-14 NOTE — Progress Notes (Signed)
Chief Complaint  Patient presents with  . Gastrophageal Reflux    since the start of fall but is worsening, really affecting her life. She is miserable. Has been using zantac, sometimes 2 per day and tums without any relief. Soda crackers did help one time while at the beach.    She has had reflux since the fall, but in the last month or two it has gotten much worse.  It is affecting what she is able to eat.  This morning all she had was toast for breakfast, and she had some symptoms by the end of her exercise class.  Complaining of indigestion, heartburn, nausea, belching.  She has not vomited.  Sometimes indigestion is before bed, and it keeps her awake.  Twice she has woken up with reflux, severe.    She recently started eating peppermint (thought it would help). She has been taking ranitidine 150mg  pretty regularly, up to twice daily (hasn't taken it today).  It doesn't completely resolve the symptoms but helps.  She has also taken Tums, soda crackers, to help with symptoms.  She has had some more chocolate since Christmas.  Caffeine level hasn't changed--large diet soda from [employer] not every day; drinks caffeine-free soda later in the day.  No coffee or tea. Denies eating citrus or tomatoes.  She hasn't been able to eat spicy foods in years. She was having too much indigestion while at the beach to even have alcohol, so has been drinking less recently.   Exertion causes some nausea. Fingernails turned blue when she was at the beach (when she was feeling bad).  Her hands are frequently cold. Off/on blue over the last two weeks, not daily.  Denies any pain associated with it. Denies dysphagia.  PMH, PSH, SH reviewed. Current Outpatient Prescriptions on File Prior to Visit  Medication Sig Dispense Refill  . amphetamine-dextroamphetamine (ADDERALL XR) 30 MG 24 hr capsule Take 1 capsule (30 mg total) by mouth every morning. 90 capsule 0  . aspirin 81 MG tablet Take 81 mg by mouth daily.     . fluticasone (FLONASE) 50 MCG/ACT nasal spray USE 2 SPRAYS IN EACH NOSTRIL ONCE A DAY. 16 g 11  . loratadine (CLARITIN) 10 MG tablet Take 10 mg by mouth daily.      . simvastatin (ZOCOR) 10 MG tablet TAKE ONE TABLET AT BEDTIME. 90 tablet 1  . SYNTHROID 25 MCG tablet TAKE 1 TABLET ONCE DAILY BEFORE BREAKFAST. 90 tablet 1  . traZODone (DESYREL) 50 MG tablet Take 1-2 tablets (50-100 mg total) by mouth at bedtime as needed for sleep. 60 tablet 11   No current facility-administered medications on file prior to visit.   Taking ranitidine 150mg  BID as per HPI  Allergies  Allergen Reactions  . Morphine And Related Other (See Comments)    Family history.  . Codeine Itching and Rash    ROS: no fevers, chills, URI symptoms, cough, shortness of breath, chest pain.  Denies abdominal pain, bowel changes.  Denies urinary complaints, headaches, dizziness, depression.  No bleeding, bruising, rash.  See HPI  PHYSICAL EXAM: BP 108/60 mmHg  Pulse 64  Ht 4\' 11"  (1.499 m)  Wt 104 lb (47.174 kg)  BMI 20.99 kg/m2  Well appearing, pleasant female in no distress Neck: no lymphadenopathy, thyromegaly or mass Heart: regular rate and rhythm without murmur Lungs: clear bilaterally Abdomen: soft, nontender, no organomegaly or mass. No rebound tenderness or guarding Extremities: no edema Psych: normal mood, affect, hygiene and grooming Neuro: alert  and oriented. Normal strength, gait, cranial nerves  EKG:  NSR with no acute abnormalities  ASSESSMENT/PLAN:  Gastroesophageal reflux disease, esophagitis presence not specified - strong PPI x 2-3 weeks, then taper down.  proper diet/precautions reviewed. f/u if not improving  Nausea - with exertion--CXR done to eval for ischemia.  pt reassured. - Plan: EKG 12-Lead   Dexilant #20 If symptoms completely resolve, then back down to OTC PPI such as Nexium or Prilosec.  If once daily OTC is not effective, increase to taking 2/day, and call for  prescription.  25 min visit, more than 1/2 spent counseling

## 2014-08-05 ENCOUNTER — Encounter: Payer: Self-pay | Admitting: Internal Medicine

## 2014-08-08 ENCOUNTER — Telehealth: Payer: Self-pay | Admitting: Family Medicine

## 2014-08-10 ENCOUNTER — Other Ambulatory Visit: Payer: Self-pay | Admitting: Family Medicine

## 2014-08-19 ENCOUNTER — Other Ambulatory Visit: Payer: Self-pay | Admitting: Family Medicine

## 2014-08-19 ENCOUNTER — Telehealth: Payer: Self-pay | Admitting: Family Medicine

## 2014-08-19 DIAGNOSIS — G4700 Insomnia, unspecified: Secondary | ICD-10-CM

## 2014-08-19 DIAGNOSIS — F988 Other specified behavioral and emotional disorders with onset usually occurring in childhood and adolescence: Secondary | ICD-10-CM

## 2014-08-19 MED ORDER — AMPHETAMINE-DEXTROAMPHET ER 30 MG PO CP24
30.0000 mg | ORAL_CAPSULE | ORAL | Status: DC
Start: 2014-08-19 — End: 2014-11-22

## 2014-08-19 NOTE — Telephone Encounter (Signed)
Requesting refill on Adderall XR 30MG . Call [phone] when script ready for pick up

## 2014-08-19 NOTE — Telephone Encounter (Signed)
I printed--if you can please grab off printer and have me sign, then notify pt it is ready.  thanks

## 2014-08-19 NOTE — Telephone Encounter (Signed)
Is this okay to refill? 

## 2014-08-20 NOTE — Telephone Encounter (Signed)
Recv'd message from Coverage my meds that pt has approval til 06/07/15.  Pt states went to pharmacy and still not going thru.  Called [facility] andthere was problem with new group number and then the brand name was approved but cost $192.  Called back & had them do another P.A. For generic and it was approved and cost of $36.  Pharmacy ran and it went thru.  Pt informed.

## 2014-09-29 HISTORY — PX: KNEE SURGERY: SHX244

## 2014-10-11 ENCOUNTER — Other Ambulatory Visit: Payer: BC Managed Care – PPO

## 2014-10-11 DIAGNOSIS — E039 Hypothyroidism, unspecified: Secondary | ICD-10-CM

## 2014-10-11 DIAGNOSIS — E7800 Pure hypercholesterolemia, unspecified: Secondary | ICD-10-CM

## 2014-10-11 DIAGNOSIS — Z5181 Encounter for therapeutic drug level monitoring: Secondary | ICD-10-CM

## 2014-10-11 LAB — CBC WITH DIFFERENTIAL/PLATELET
Basophils Absolute: 0.1 10*3/uL (ref 0.0–0.1)
Basophils Relative: 1 % (ref 0–1)
Eosinophils Absolute: 0.2 10*3/uL (ref 0.0–0.7)
Eosinophils Relative: 3 % (ref 0–5)
HEMATOCRIT: 32.9 % — AB (ref 36.0–46.0)
HEMOGLOBIN: 11.2 g/dL — AB (ref 12.0–15.0)
Lymphocytes Relative: 33 % (ref 12–46)
Lymphs Abs: 1.9 10*3/uL (ref 0.7–4.0)
MCH: 29.2 pg (ref 26.0–34.0)
MCHC: 34 g/dL (ref 30.0–36.0)
MCV: 85.9 fL (ref 78.0–100.0)
MONO ABS: 0.5 10*3/uL (ref 0.1–1.0)
MPV: 8.3 fL — ABNORMAL LOW (ref 8.6–12.4)
Monocytes Relative: 9 % (ref 3–12)
Neutro Abs: 3.2 10*3/uL (ref 1.7–7.7)
Neutrophils Relative %: 54 % (ref 43–77)
Platelets: 277 10*3/uL (ref 150–400)
RBC: 3.83 MIL/uL — AB (ref 3.87–5.11)
RDW: 13.4 % (ref 11.5–15.5)
WBC: 5.9 10*3/uL (ref 4.0–10.5)

## 2014-10-11 LAB — COMPREHENSIVE METABOLIC PANEL
ALBUMIN: 3.7 g/dL (ref 3.5–5.2)
ALT: 16 U/L (ref 0–35)
AST: 17 U/L (ref 0–37)
Alkaline Phosphatase: 65 U/L (ref 39–117)
BILIRUBIN TOTAL: 0.4 mg/dL (ref 0.2–1.2)
BUN: 12 mg/dL (ref 6–23)
CALCIUM: 8.7 mg/dL (ref 8.4–10.5)
CO2: 26 meq/L (ref 19–32)
Chloride: 99 mEq/L (ref 96–112)
Creat: 0.59 mg/dL (ref 0.50–1.10)
Glucose, Bld: 82 mg/dL (ref 70–99)
POTASSIUM: 4.2 meq/L (ref 3.5–5.3)
SODIUM: 133 meq/L — AB (ref 135–145)
Total Protein: 6 g/dL (ref 6.0–8.3)

## 2014-10-11 LAB — LIPID PANEL
Cholesterol: 185 mg/dL (ref 0–200)
HDL: 70 mg/dL (ref >=46)
LDL Cholesterol: 97 mg/dL (ref 0–99)
TRIGLYCERIDES: 89 mg/dL (ref <150)
Total CHOL/HDL Ratio: 2.6 Ratio
VLDL: 18 mg/dL (ref 0–40)

## 2014-10-11 LAB — TSH: TSH: 2.35 u[IU]/mL (ref 0.350–4.500)

## 2014-10-13 ENCOUNTER — Encounter: Payer: Self-pay | Admitting: Family Medicine

## 2014-10-13 ENCOUNTER — Ambulatory Visit (INDEPENDENT_AMBULATORY_CARE_PROVIDER_SITE_OTHER): Payer: BC Managed Care – PPO | Admitting: Family Medicine

## 2014-10-13 VITALS — BP 102/60 | HR 60 | Ht 59.0 in | Wt 101.4 lb

## 2014-10-13 DIAGNOSIS — F988 Other specified behavioral and emotional disorders with onset usually occurring in childhood and adolescence: Secondary | ICD-10-CM

## 2014-10-13 DIAGNOSIS — F909 Attention-deficit hyperactivity disorder, unspecified type: Secondary | ICD-10-CM | POA: Diagnosis not present

## 2014-10-13 DIAGNOSIS — Z5181 Encounter for therapeutic drug level monitoring: Secondary | ICD-10-CM

## 2014-10-13 DIAGNOSIS — R110 Nausea: Secondary | ICD-10-CM

## 2014-10-13 DIAGNOSIS — M8580 Other specified disorders of bone density and structure, unspecified site: Secondary | ICD-10-CM | POA: Diagnosis not present

## 2014-10-13 DIAGNOSIS — Z9889 Other specified postprocedural states: Secondary | ICD-10-CM

## 2014-10-13 DIAGNOSIS — Z0000 Encounter for general adult medical examination without abnormal findings: Secondary | ICD-10-CM

## 2014-10-13 DIAGNOSIS — E039 Hypothyroidism, unspecified: Secondary | ICD-10-CM | POA: Diagnosis not present

## 2014-10-13 DIAGNOSIS — D649 Anemia, unspecified: Secondary | ICD-10-CM

## 2014-10-13 DIAGNOSIS — J309 Allergic rhinitis, unspecified: Secondary | ICD-10-CM

## 2014-10-13 DIAGNOSIS — E7800 Pure hypercholesterolemia, unspecified: Secondary | ICD-10-CM

## 2014-10-13 DIAGNOSIS — R112 Nausea with vomiting, unspecified: Secondary | ICD-10-CM

## 2014-10-13 DIAGNOSIS — K219 Gastro-esophageal reflux disease without esophagitis: Secondary | ICD-10-CM

## 2014-10-13 LAB — POCT URINALYSIS DIPSTICK
Bilirubin, UA: NEGATIVE
Glucose, UA: NEGATIVE
Ketones, UA: NEGATIVE
Leukocytes, UA: NEGATIVE
Nitrite, UA: NEGATIVE
PH UA: 6
Protein, UA: NEGATIVE
RBC UA: NEGATIVE
Spec Grav, UA: 1.015
UROBILINOGEN UA: NEGATIVE

## 2014-10-13 MED ORDER — DEXLANSOPRAZOLE 60 MG PO CPDR: 60.0000 mg | DELAYED_RELEASE_CAPSULE | Freq: Every day | ORAL | Status: DC

## 2014-10-13 MED ORDER — FLUTICASONE PROPIONATE 50 MCG/ACT NA SUSP: NASAL | Status: DC

## 2014-10-13 MED ORDER — SIMVASTATIN 10 MG PO TABS: ORAL_TABLET | ORAL | Status: DC

## 2014-10-13 MED ORDER — SYNTHROID 25 MCG PO TABS: ORAL_TABLET | ORAL | Status: DC

## 2014-10-13 NOTE — Patient Instructions (Signed)
  HEALTH MAINTENANCE RECOMMENDATIONS:  It is recommended that you get at least 30 minutes of aerobic exercise at least 5 days/week (for weight loss, you may need as much as 60-90 minutes). This can be any activity that gets your heart rate up. This can be divided in 10-15 minute intervals if needed, but try and build up your endurance at least once a week.  Weight bearing exercise is also recommended twice weekly.  Eat a healthy diet with lots of vegetables, fruits and fiber.  "Colorful" foods have a lot of vitamins (ie green vegetables, tomatoes, red peppers, etc).  Limit sweet tea, regular sodas and alcoholic beverages, all of which has a lot of calories and sugar.  Up to 1 alcoholic drink daily may be beneficial for women (unless trying to lose weight, watch sugars).  Drink a lot of water.  Calcium recommendations are 1200-1500 mg daily (1500 mg for postmenopausal women or women without ovaries), and vitamin D 1000 IU daily.  This should be obtained from diet and/or supplements (vitamins), and calcium should not be taken all at once, but in divided doses.  Monthly self breast exams and yearly mammograms for women over the age of 42 is recommended.  Sunscreen of at least SPF 30 should be used on all sun-exposed parts of the skin when outside between the hours of 10 am and 4 pm (not just when at beach or pool, but even with exercise, golf, tennis, and yard work!)  Use a sunscreen that says "broad spectrum" so it covers both UVA and UVB rays, and make sure to reapply every 1-2 hours.  Remember to change the batteries in your smoke detectors when changing your clock times in the spring and fall.  Use your seat belt every time you are in a car, and please drive safely and not be distracted with cell phones and texting while driving.    Reflux:  try and be consistent with twice daily famotidine.  If not as effective, try OTC prilosec or Nexium once daily.  Continue proper food avoidance and behavioral  measures as previously reviewed.  Allergies--consider changing up the antihistamine.  Try allegra or zyrtec in place of claritin. Continue flonase.

## 2014-10-13 NOTE — Progress Notes (Signed)
Chief Complaint  Patient presents with  . Annual Exam    nonfasting (labs already done) annual exam without pap-sees Dr.Fogleman/med check. Did not do eye exam, patient declined eye exam as she has one at eye doctor yearly. Wants to discuss reflux today.    Nicole Allen is a 64 y.o. female who presents for a complete physical.  She has the following concerns:  She tore her R ACL while skiing in [place] in March.  She had surgery 2 weeks ago, started PT yesterday. Her appetite has been decreased, eating less overall since completely inactive, with some weight loss.  GERD: Seen in January, given Dexilant samples which helped.  She read about risks of PPI if you have family h/o heart disease, so she switched to famotidine rather than taking Nexium or Prilosec OTC as discussed.  She takes it once or twice daily.  While she was skiing last month it was worse, so some evenings she didn't even go out to eat.  She has been inactive since her knee injury, and it hasn't been as bad.  She has some heartburn, but not the reflux all the way up since she was skiing.  Hyperlipidemia: She was tried off simvastatin (she was only on very low dose, tried without--still high, so then we tried treating mild hypothyroidism, but lipid didn't change). Went back on 70m of simvastatin (given family h/o CAD). Tolerating it fine without any side effects.  Hypothyroidism: She has been on 23m. She states that her hair loss has improved dramatically since being on the Synthroid.  Her mood and brain fog are significantly improved since being on the thyroid medication also.  She has lost some weight, but appetite is decreased since she has been less mobile related to her knee injury and surgery.  Also contributed some by her reflux flaring during ski trip.   ADD: At her last visit 6 months ago her dose was increased to 3075mue to the following concerns mentioned at last visit: "She feels like the medication isn't working as  well as it used to. She is more aware of this since "the brain fog has lifted". It is difficult for her to finish tasks. She is easily distracted. She will leave the refrigerator door open. She finds herself very impatient. Sometimes she "doesn't even try--I know I won't be able to do it and will be frustrated". She gets bored and frustrated waiting, craves stimulation. She thinks it is related to her ADD, not related to moods. She finds that the ADD medication helps her be able to tolerate things better. Over the last year and a half, she has seen it gradually getting more noticeable."   She reports these symptoms have improved significantly since being on the higher dose.  No side effects.    Insomnia--Trazadone is working very well for her. Uses it nightly, and denies side effects.   Allergies:Allergies are currently flaring.  She is taking Benadryl every night in the last couple of months to help with her symptoms.  The Claritin doesn't seem to work as well.  She is taking the Flonase regularly. Her nose tends to be very stuffy; she has nasal saline spray, ? If it helps.  Osteopenia:  She had DEXA ordered by GYN in January.  She never got the results.  Reviewed with patient--decline noted. T-2.0.  She was told to stop taking calcium supplements due to family h/o heart disease.  She admits to inadequate calcium intake in her diet.  Immunization History  Administered Date(s) Administered  . Influenza Split 04/23/2011  . Influenza, Seasonal, Injecte, Preservative Fre 06/09/2012  . Influenza,inj,Quad PF,36+ Mos 04/22/2014  . Tdap 12/26/2005  . Zoster 05/14/2011   Last Pap smear: UTD through GYN, sees Dr. Pamala Hurry in the summer Last mammogram: 08/2012.  She states she had mammogram at Dr. Earl Many office (for the first time). Last colonoscopy: 12/2011, small polyp, due again in 5 years  Last DEXA: 07/2014--she reports she never heard from her GYn with results Ophtho: yearly   Dentist: every 6 months  Exercise: limited since knee injury and surgery in March. (prior was 3-5 times/week (classes at [facility])) Vitamin D was 63 in May 2015  Past Medical History  Diagnosis Date  . Allergy     SEASONAL  . ADD (attention deficit disorder)   . Postmenopausal     on HRT--sees GYN  . Kidney stone     x3, Dr. Reece Agar  . Pure hypercholesterolemia   . Osteopenia   . BCC (basal cell carcinoma of skin)     chest; Dr. Tonia Brooms  . Cervical spondylosis   . Insomnia     worse with menopause  . Hearing loss     high frequency (Dr. Prescott Parma); hearing aids recommended; bilateral  . History of kidney stones     x3  . BCC (basal cell carcinoma)     Tonia Brooms  . Hypothyroidism 2015    Past Surgical History  Procedure Laterality Date  . Tonsillectomy and adenoidectomy    . Hymenectomy  1993  . Tubal ligation  1991  . Cesarean section  [id number]  . Colonoscopy  2003, 2013    Dr. Collene Mares  . Knee surgery Right 09/29/2014    Dr. Jim Desanctis replacement/reconstruction    History   Social History  . Marital Status: Married    Spouse Name: N/A  . Number of Children: 2  . Years of Education: N/A   Occupational History  . retired Teaching laboratory technician    Social History Main Topics  . Smoking status: Never Smoker   . Smokeless tobacco: Never Used  . Alcohol Use: Yes     Comment: 3-5 drinks per week.  . Drug Use: No  . Sexual Activity:    Partners: Male   Other Topics Concern  . Not on file   Social History Narrative   Lives with husband.  Son in [place], [place] in [place], Daughter is in [place]    Family History  Problem Relation Age of Onset  . Asthma Mother   . Arthritis Mother   . Hypertension Mother   . Hyperlipidemia Mother   . Hypertension Father   . Heart disease Father     27's  . Hyperlipidemia Father   . Alzheimer's disease Maternal Grandmother   . Cancer Maternal Grandfather     lung  . Cancer Paternal Grandfather     lung  . ADD / ADHD Daughter   .  ADD / ADHD Son     Outpatient Encounter Prescriptions as of 10/13/2014  Medication Sig Note  . amphetamine-dextroamphetamine (ADDERALL XR) 30 MG 24 hr capsule Take 1 capsule (30 mg total) by mouth every morning.   Marland Kitchen ESTRING 2 MG vaginal ring Place 2 mg vaginally every 3 (three) months.  10/13/2014: Received from: External Pharmacy  . famotidine (PEPCID) 20 MG tablet Take 20 mg by mouth daily. 10/13/2014: Takes it once or twice daily  . fluticasone (FLONASE) 50 MCG/ACT nasal spray USE 2 SPRAYS IN [facility]  NOSTRIL ONCE A DAY.   Marland Kitchen loratadine (CLARITIN) 10 MG tablet Take 10 mg by mouth daily.     . simvastatin (ZOCOR) 10 MG tablet TAKE ONE TABLET AT BEDTIME.   . SYNTHROID 25 MCG tablet TAKE 1 TABLET ONCE DAILY BEFORE BREAKFAST.   Marland Kitchen traZODone (DESYREL) 50 MG tablet TAKE 1 OR 2 TABLETS AT BEDTIME AS NEEDED FOR SLEEP.   . [DISCONTINUED] fluticasone (FLONASE) 50 MCG/ACT nasal spray USE 2 SPRAYS IN EACH NOSTRIL ONCE A DAY.   . [DISCONTINUED] ranitidine (ZANTAC) 150 MG tablet Take 150 mg by mouth 2 (two) times daily.   . [DISCONTINUED] simvastatin (ZOCOR) 10 MG tablet TAKE ONE TABLET AT BEDTIME.   . [DISCONTINUED] SYNTHROID 25 MCG tablet TAKE 1 TABLET ONCE DAILY BEFORE BREAKFAST.   Marland Kitchen aspirin 81 MG tablet Take 81 mg by mouth daily. 10/13/2014: Stopped related to surgery  . calcium carbonate (TUMS - DOSED IN MG ELEMENTAL CALCIUM) 500 MG chewable tablet Chew 1-2 tablets by mouth as needed for indigestion or heartburn.   . dexlansoprazole (DEXILANT) 60 MG capsule Take 1 capsule (60 mg total) by mouth daily. (Patient not taking: Reported on 10/13/2014) 10/13/2014: Had samples in January  . [DISCONTINUED] diazepam (VALIUM) 2 MG tablet Take 2 mg by mouth every 6 (six) hours as needed.  10/13/2014: Received from: External Pharmacy  . [DISCONTINUED] ondansetron (ZOFRAN-ODT) 4 MG disintegrating tablet  10/13/2014: Received from: External Pharmacy  . [DISCONTINUED] promethazine (PHENERGAN) 12.5 MG tablet  10/13/2014: Received  from: External Pharmacy  . [DISCONTINUED] traMADol (ULTRAM) 50 MG tablet  10/13/2014: Received from: External Pharmacy    Allergies  Allergen Reactions  . Morphine And Related Other (See Comments)    Family history.  . Codeine Itching and Rash   ROS: The patient denies fever, headaches, vision changes, ear pain, sore throat, breast concerns, chest pain, palpitations, dizziness, syncope, dyspnea on exertion, cough, swelling, nausea, vomiting, diarrhea, constipation, abdominal pain, melena, hematochezia, hematuria, incontinence, dysuria, vaginal bleeding, discharge, odor or itch, genital lesions, joint pains (other than her knee), numbness, tingling, weakness, tremor, suspicious skin lesions, depression, anxiety, abnormal bleeding/bruising, or enlarged lymph nodes.  +bilateral hearing loss, unchanged Down 5# from her last physical a year ago--see HPI   PHYSICAL EXAM:  BP 102/60 mmHg  Pulse 60  Ht 4' 11"  (1.499 m)  Wt 101 lb 6.4 oz (45.995 kg)  BMI 20.47 kg/m2   General Appearance:  Alert, cooperative, no distress, appears stated age   Head:  Normocephalic, without obvious abnormality, atraumatic   Eyes:  PERRL, conjunctiva/corneas clear, EOM's intact, fundi  benign   Ears:  Normal TM's and external ear canals   Nose:  Nares normal, mucosa mildly edematous, no drainage or sinus tenderness   Throat:  Lips, mucosa, and tongue normal; teeth and gums normal.   Neck:  Supple, no lymphadenopathy; thyroid: no enlargement/tenderness/nodules; no carotid  bruit or JVD   Back:  Spine nontender, no curvature, ROM normal, no CVA tenderness   Lungs:  Clear to auscultation bilaterally without wheezes, rales or ronchi; respirations unlabored   Chest Wall:  No tenderness or deformity   Heart:  Regular rate and rhythm, S1 and S2 normal, no murmur, rub  or gallop   Breast Exam:  Deferred to GYN   Abdomen:  Soft, non-tender, nondistended, normoactive bowel sounds,  no  masses, no hepatosplenomegaly   Genitalia:  Deferred to GYN      Extremities:  No clubbing, cyanosis or edema   Pulses:  2+ and symmetric  all extremities   Skin:  Skin color, texture, turgor normal. No rash or suspicious lesions  Lymph nodes:  Cervical, supraclavicular, and axillary nodes normal   Neurologic:  CNII-XII intact, normal strength, sensation and gait; reflexes 2+ and symmetric throughout   Psych:  Normal mood, affect, hygiene and grooming       Urine dip normal.  Lab Results  Component Value Date   WBC 5.9 10/11/2014   HGB 11.2* 10/11/2014   HCT 32.9* 10/11/2014   MCV 85.9 10/11/2014   PLT 277 10/11/2014   (s/p knee surgery 2 weeks ago)    Chemistry      Component Value Date/Time   NA 133* 10/11/2014 0001   K 4.2 10/11/2014 0001   CL 99 10/11/2014 0001   CO2 26 10/11/2014 0001   BUN 12 10/11/2014 0001   CREATININE 0.59 10/11/2014 0001      Component Value Date/Time   CALCIUM 8.7 10/11/2014 0001   ALKPHOS 65 10/11/2014 0001   AST 17 10/11/2014 0001   ALT 16 10/11/2014 0001   BILITOT 0.4 10/11/2014 0001     Glucose 82  Lab Results  Component Value Date   TSH 2.350 10/11/2014   Lab Results  Component Value Date   CHOL 185 10/11/2014   HDL 70 10/11/2014   LDLCALC 97 10/11/2014   TRIG 89 10/11/2014   CHOLHDL 2.6 10/11/2014     ASSESSMENT/PLAN:  Annual physical exam - Plan: POCT Urinalysis Dipstick  PONV (postoperative nausea and vomiting) - likely also contributed to weight loss; told to have it well-documented in her chart for any future surgeries  Hypothyroidism, unspecified hypothyroidism type - adequately replaced with improvement in symptoms.  continue current dose - Plan: SYNTHROID 25 MCG tablet, TSH  Pure hypercholesterolemia - well controlled with very low dose medication - Plan: simvastatin (ZOCOR) 10 MG tablet, Lipid panel, Comprehensive metabolic panel  Allergic rhinitis, unspecified allergic  rhinitis type - suboptimally controlled; try switching oral antihistamine; continue Flonase - Plan: fluticasone (FLONASE) 50 MCG/ACT nasal spray  Gastroesophageal reflux disease, esophagitis presence not specified - risks/SE's of untreated reflux and PPI's reviewed. 10d of Deilant sample to treat current flare.  Then famotidine BID vs change to OTC PPI if ineffective - Plan: dexlansoprazole (DEXILANT) 60 MG capsule  ADD (attention deficit disorder) - improved on 74m dose; continue  Osteopenia - discussed dietary sources of calcium, and recommendation of [id number] from diet and supplements. Weight bearing exercise.  Repeat in 07/2016  Anemia, unspecified anemia type - Plan: CBC with Differential/Platelet  Medication monitoring encounter - Plan: Lipid panel, TSH, Comprehensive metabolic panel  Discussed monthly self breast exams and yearly mammograms; at least 30 minutes of aerobic activity at least 5 days/week, weight-bearing exercise 2x/wk; proper sunscreen use reviewed; healthy diet, including goals of calcium and vitamin D intake and alcohol recommendations (less than or equal to 1 drink/day) reviewed; regular seatbelt use; changing batteries in smoke detectors.  Immunization recommendations discussed--Prevnar next year; continue yearly flu shots.  Colonoscopy recommendations reviewed--UTD, due 11/2016  F/u 6 months with labs prior.  Recheck c-met rather than just LFT's due to slightly low Na this time. Also recheck CBC in 6 months.  I suspect that mild anemia is related to blood loss from surgery.  She has no symptoms of bleeding, and MCV was normal.  GERD--risks/side effects of untreated reflux and PPI's reviewed at length.  Currently having symptoms--retreat with 10 course of Dexilant.  Then she can try and be consistent with twice  daily famotidine.  If not as effective, try OTC prilosec or Nexium once daily.  Continue proper food avoidance and behavioral measures as previously  reviewed.

## 2014-11-22 ENCOUNTER — Telehealth: Payer: Self-pay | Admitting: Family Medicine

## 2014-11-22 DIAGNOSIS — F988 Other specified behavioral and emotional disorders with onset usually occurring in childhood and adolescence: Secondary | ICD-10-CM

## 2014-11-22 MED ORDER — AMPHETAMINE-DEXTROAMPHET ER 30 MG PO CP24: 30.0000 mg | ORAL_CAPSULE | ORAL | Status: DC

## 2014-11-22 NOTE — Telephone Encounter (Signed)
Signed--please advise rx ready

## 2014-11-22 NOTE — Telephone Encounter (Signed)
Requesting refill on Adderall XR 30mg . Call pt @ [phone] when script is ready for pick up

## 2014-11-22 NOTE — Telephone Encounter (Signed)
Patient advised.

## 2014-11-26 ENCOUNTER — Other Ambulatory Visit: Payer: Self-pay | Admitting: Family Medicine

## 2014-11-26 NOTE — Telephone Encounter (Signed)
done

## 2014-11-26 NOTE — Telephone Encounter (Signed)
Is this okay to refill? 

## 2015-02-03 ENCOUNTER — Other Ambulatory Visit: Payer: Self-pay | Admitting: Family Medicine

## 2015-02-03 MED ORDER — SYNTHROID 25 MCG PO TABS: ORAL_TABLET | ORAL | Status: DC

## 2015-02-03 NOTE — Telephone Encounter (Signed)
Dr.lalonde is this okay Dr.Knapp pt

## 2015-02-23 ENCOUNTER — Telehealth: Payer: Self-pay | Admitting: Family Medicine

## 2015-02-23 DIAGNOSIS — F988 Other specified behavioral and emotional disorders with onset usually occurring in childhood and adolescence: Secondary | ICD-10-CM

## 2015-02-23 MED ORDER — AMPHETAMINE-DEXTROAMPHET ER 30 MG PO CP24: 30.0000 mg | ORAL_CAPSULE | ORAL | Status: DC

## 2015-02-23 NOTE — Telephone Encounter (Signed)
Printed--please give to me to sign then notify pt

## 2015-02-23 NOTE — Telephone Encounter (Signed)
Pt called for 90 day refill for Dextro Amphetemine ER 30 mg

## 2015-02-24 NOTE — Telephone Encounter (Signed)
Patient informed. 

## 2015-04-11 ENCOUNTER — Other Ambulatory Visit: Payer: BC Managed Care – PPO

## 2015-04-11 DIAGNOSIS — D649 Anemia, unspecified: Secondary | ICD-10-CM

## 2015-04-11 DIAGNOSIS — E039 Hypothyroidism, unspecified: Secondary | ICD-10-CM

## 2015-04-11 DIAGNOSIS — E7800 Pure hypercholesterolemia, unspecified: Secondary | ICD-10-CM

## 2015-04-11 DIAGNOSIS — Z5181 Encounter for therapeutic drug level monitoring: Secondary | ICD-10-CM

## 2015-04-11 LAB — CBC WITH DIFFERENTIAL/PLATELET
Basophils Absolute: 0.1 10*3/uL (ref 0.0–0.1)
Basophils Relative: 1 % (ref 0–1)
Eosinophils Absolute: 0.2 10*3/uL (ref 0.0–0.7)
Eosinophils Relative: 3 % (ref 0–5)
HEMATOCRIT: 34.4 % — AB (ref 36.0–46.0)
HEMOGLOBIN: 12.1 g/dL (ref 12.0–15.0)
LYMPHS ABS: 1.9 10*3/uL (ref 0.7–4.0)
LYMPHS PCT: 36 % (ref 12–46)
MCH: 30 pg (ref 26.0–34.0)
MCHC: 35.2 g/dL (ref 30.0–36.0)
MCV: 85.4 fL (ref 78.0–100.0)
MONO ABS: 0.4 10*3/uL (ref 0.1–1.0)
MPV: 8.7 fL (ref 8.6–12.4)
Monocytes Relative: 8 % (ref 3–12)
Neutro Abs: 2.8 10*3/uL (ref 1.7–7.7)
Neutrophils Relative %: 52 % (ref 43–77)
Platelets: 205 10*3/uL (ref 150–400)
RBC: 4.03 MIL/uL (ref 3.87–5.11)
RDW: 13.5 % (ref 11.5–15.5)
WBC: 5.4 10*3/uL (ref 4.0–10.5)

## 2015-04-11 LAB — LIPID PANEL
Cholesterol: 203 mg/dL — ABNORMAL HIGH (ref 125–200)
HDL: 88 mg/dL (ref >=46)
LDL CALC: 100 mg/dL (ref <130)
Total CHOL/HDL Ratio: 2.3 Ratio (ref <=5.0)
Triglycerides: 73 mg/dL (ref <150)
VLDL: 15 mg/dL (ref <30)

## 2015-04-11 LAB — COMPREHENSIVE METABOLIC PANEL
ALK PHOS: 46 U/L (ref 33–130)
ALT: 10 U/L (ref 6–29)
AST: 16 U/L (ref 10–35)
Albumin: 4 g/dL (ref 3.6–5.1)
BUN: 15 mg/dL (ref 7–25)
CO2: 26 mmol/L (ref 20–31)
Calcium: 9 mg/dL (ref 8.6–10.4)
Chloride: 103 mmol/L (ref 98–110)
Creat: 0.62 mg/dL (ref 0.50–0.99)
GLUCOSE: 92 mg/dL (ref 65–99)
POTASSIUM: 4.3 mmol/L (ref 3.5–5.3)
Sodium: 138 mmol/L (ref 135–146)
Total Bilirubin: 0.4 mg/dL (ref 0.2–1.2)
Total Protein: 5.9 g/dL — ABNORMAL LOW (ref 6.1–8.1)

## 2015-04-12 LAB — TSH: TSH: 4.06 u[IU]/mL (ref 0.350–4.500)

## 2015-04-14 ENCOUNTER — Encounter: Payer: Self-pay | Admitting: Family Medicine

## 2015-04-14 ENCOUNTER — Ambulatory Visit (INDEPENDENT_AMBULATORY_CARE_PROVIDER_SITE_OTHER): Payer: BC Managed Care – PPO | Admitting: Family Medicine

## 2015-04-14 VITALS — BP 108/70 | HR 64 | Ht 59.0 in | Wt 104.0 lb

## 2015-04-14 DIAGNOSIS — E7800 Pure hypercholesterolemia, unspecified: Secondary | ICD-10-CM

## 2015-04-14 DIAGNOSIS — K219 Gastro-esophageal reflux disease without esophagitis: Secondary | ICD-10-CM | POA: Diagnosis not present

## 2015-04-14 DIAGNOSIS — Z23 Encounter for immunization: Secondary | ICD-10-CM

## 2015-04-14 DIAGNOSIS — G4710 Hypersomnia, unspecified: Secondary | ICD-10-CM

## 2015-04-14 DIAGNOSIS — E039 Hypothyroidism, unspecified: Secondary | ICD-10-CM

## 2015-04-14 DIAGNOSIS — R400 Somnolence: Secondary | ICD-10-CM

## 2015-04-14 DIAGNOSIS — F909 Attention-deficit hyperactivity disorder, unspecified type: Secondary | ICD-10-CM

## 2015-04-14 DIAGNOSIS — F988 Other specified behavioral and emotional disorders with onset usually occurring in childhood and adolescence: Secondary | ICD-10-CM

## 2015-04-14 MED ORDER — SIMVASTATIN 10 MG PO TABS: ORAL_TABLET | ORAL | Status: DC

## 2015-04-14 MED ORDER — OMEPRAZOLE 40 MG PO CPDR: 40.0000 mg | DELAYED_RELEASE_CAPSULE | Freq: Every day | ORAL | Status: DC

## 2015-04-14 NOTE — Progress Notes (Signed)
Chief Complaint  Patient presents with  . Med check    nonfasting med check, labs already done.    GERD: reflux has been significantly worse since the last visit--needing pepcid twice daily as well as tums multiple times each day. She tries to watch her diet.  Hyperlipidemia: She was tried off simvastatin (she was only on very low dose, tried without--still high, so then we tried treating mild hypothyroidism, but lipid didn't change). Went back on 10mg  of simvastatin (given family h/o CAD). Tolerating it fine without any side effects. She is asking about decreasing dose.  Hypothyroidism: She has been on 75mcg. She states that her hair loss has improved dramatically since being on the Synthroid. Her mood and brain fog are significantly improved since being on the thyroid medication also.She has been taking it on empty stomach separate from medications.  She has been taking Tums frequently for heartburn, and sometimes is 3 hours after taking her pill.  WUX:LKGM year her Adderall dose was increased to 30mg , and is doing well on that dose. No side effects.   She gets sleepy on long car rides, can't drive over an hour and a half without getting very sleepy.  Her sister has sleep apnea.  She knows she snores, not aware of apnea.  Insomnia--Trazadone is working very well for her. Uses it nightly, and denies side effects. She does report that she thinks she is restless at night, tosses and turns.  Occasionally needs to take a second pill of trazadone.  Allergies:Allergies are doing very well on the Flonase and Allegra.  Cataracts--doesn't see well enough to feel like she can drive at night. Contacts are being changed currently to try and improve her vision.  PMH, PSH, SH reviewed.  Outpatient Encounter Prescriptions as of 04/14/2015  Medication Sig Note  . amphetamine-dextroamphetamine (ADDERALL XR) 30 MG 24 hr capsule Take 1 capsule (30 mg total) by mouth every morning.   Marland Kitchen aspirin 81  MG tablet Take 81 mg by mouth daily. 04/14/2015: .   . calcium carbonate (TUMS - DOSED IN MG ELEMENTAL CALCIUM) 500 MG chewable tablet Chew 1-2 tablets by mouth as needed for indigestion or heartburn. 04/14/2015: Needing multiple times/day lately  . ESTRING 2 MG vaginal ring Place 2 mg vaginally every 3 (three) months.  10/13/2014: Received from: External Pharmacy  . famotidine (PEPCID) 20 MG tablet Take 20 mg by mouth daily. 04/14/2015: Taking twice daily  . fexofenadine (ALLEGRA) 180 MG tablet Take 180 mg by mouth daily.   . fluticasone (FLONASE) 50 MCG/ACT nasal spray USE 2 SPRAYS IN EACH NOSTRIL ONCE A DAY.   Marland Kitchen Omega-3 Fatty Acids (FISH OIL) 1000 MG CAPS Take 1 capsule by mouth daily.   . simvastatin (ZOCOR) 10 MG tablet TAKE ONE TABLET AT BEDTIME.   . SYNTHROID 25 MCG tablet TAKE 1 TABLET ONCE DAILY BEFORE BREAKFAST.   Marland Kitchen traZODone (DESYREL) 50 MG tablet TAKE 1 OR 2 TABLETS AT BEDTIME AS NEEDED FOR SLEEP.   . [DISCONTINUED] loratadine (CLARITIN) 10 MG tablet Take 10 mg by mouth daily.     Marland Kitchen dexlansoprazole (DEXILANT) 60 MG capsule Take 1 capsule (60 mg total) by mouth daily. (Patient not taking: Reported on 04/14/2015)   . [DISCONTINUED] PENNSAID 2 % SOLN APPLY 2 PUMPS (2 GRAMS) TO AFFECTED AREA TOPICALLY TWICE DAILY AS DIRECTED. 04/14/2015: Received from: External Pharmacy   No facility-administered encounter medications on file as of 04/14/2015.   ROS:  No fever, chills, URI or allergy symptoms, headaches, dizziness,  anorexia, chest pain, palpitations, vomiting or bowel changes.  No hair/skin changes.  +worsening reflux, daytime somnolence with long car rides.  Denies depression or anxiety. No bleeding, bruising, rash or other concerns, except as noted in HPI  PHYSICAL EXAM:   BP 108/70 mmHg  Pulse 64  Ht 4\' 11"  (1.499 m)  Wt 104 lb (47.174 kg)  BMI 20.99 kg/m2  Well developed, pleasant female in no distress HEENT: PERRL, EOMI, conjunctiva clear. Nasal mucosa is normal, OP is  clear Neck: no lymphadenopathy, thyromegaly or mass Heart: regular rate and rhythm without murmur Lungs: clear bilaterally Back: no CVA tenderness Abdomen: soft, nontender, no organomegaly or mass Extremities: no edema, 2+ pulses Skin: no rash or lesions Neuro: alert and oriented. Normal strength, gait, cranial nerves Psych: slightly irritable today, full range of affect, normal mood. Normal hygiene, grooming, eye contact and speech  Lab Results  Component Value Date   WBC 5.4 04/11/2015   HGB 12.1 04/11/2015   HCT 34.4* 04/11/2015   MCV 85.4 04/11/2015   PLT 205 04/11/2015   Lab Results  Component Value Date   CHOL 203* 04/11/2015   HDL 88 04/11/2015   LDLCALC 100 04/11/2015   TRIG 73 04/11/2015   CHOLHDL 2.3 04/11/2015   Lab Results  Component Value Date   TSH 4.060 04/11/2015     Chemistry      Component Value Date/Time   NA 138 04/11/2015 0001   K 4.3 04/11/2015 0001   CL 103 04/11/2015 0001   CO2 26 04/11/2015 0001   BUN 15 04/11/2015 0001   CREATININE 0.62 04/11/2015 0001      Component Value Date/Time   CALCIUM 9.0 04/11/2015 0001   ALKPHOS 46 04/11/2015 0001   AST 16 04/11/2015 0001   ALT 10 04/11/2015 0001   BILITOT 0.4 04/11/2015 0001     Fasting glucose 92  ASSESSMENT/PLAN:  Gastroesophageal reflux disease, esophagitis presence not specified - poorly controlled. Change to PPI (if not on max doses of H2 blocker). Taper down in a month if able. Risks/side effects reviewed and risks of untreated reflux - Plan: omeprazole (PRILOSEC) 40 MG capsule  Need for prophylactic vaccination and inoculation against influenza - Plan: Flu Vaccine QUAD 36+ mos PF IM (Fluarix & Fluzone Quad PF)  Pure hypercholesterolemia - well controlled with very low dose medication. further decrease in dose isn't recommended - Plan: simvastatin (ZOCOR) 10 MG tablet  ADD (attention deficit disorder) - controlled  Hypothyroidism, unspecified hypothyroidism type - euthyroid by  history. TSH borderline high, likely related to use of Tums, interacting with med  Daytime somnolence - Plan: Split night study    Confirm your dose of famotidine--max dosing (equivalent to prescription) is 20mg  twice daily. If this is what you are taking, and it is NOT adequately controlling your reflux, then we need to change to a proton pump inhibitor (prescription omeprazole).  We are going to prescribe 40mg  of the omeprazole for you to use for a month.  If your symptoms are only somewhat controlled with this, call for refills to continue the prescription dose. If you are doing very well without problems, you can try decreasing to the over-the-counter 20mg   omeprazole (prilosec OTC).  F/u 6 months CPE with labs prior

## 2015-04-14 NOTE — Patient Instructions (Signed)
Confirm your dose of famotidine--max dosing (equivalent to prescription) is 20mg  twice daily. If this is what you are taking, and it is NOT adequately controlling your reflux, then we need to change to a proton pump inhibitor (prescription omeprazole).  We are going to prescribe 40mg  of the omeprazole for you to use for a month.  If your symptoms are only somewhat controlled with this, call for refills to continue the prescription dose. If you are doing very well without problems, you can try decreasing to the over-the-counter 20mg   omeprazole (prilosec OTC). If you don't get relief of symptoms with the 40mg  omeprazole, you may need to see a gastroenterologist.   Food Choices for Gastroesophageal Reflux Disease, Adult When you have gastroesophageal reflux disease (GERD), the foods you eat and your eating habits are very important. Choosing the right foods can help ease the discomfort of GERD. WHAT GENERAL GUIDELINES DO I NEED TO FOLLOW?  Choose fruits, vegetables, whole grains, low-fat dairy products, and low-fat meat, fish, and poultry.  Limit fats such as oils, salad dressings, butter, nuts, and avocado.  Keep a food diary to identify foods that cause symptoms.  Avoid foods that cause reflux. These may be different for different people.  Eat frequent small meals instead of three large meals each day.  Eat your meals slowly, in a relaxed setting.  Limit fried foods.  Cook foods using methods other than frying.  Avoid drinking alcohol.  Avoid drinking large amounts of liquids with your meals.  Avoid bending over or lying down until 2-3 hours after eating. WHAT FOODS ARE NOT RECOMMENDED? The following are some foods and drinks that may worsen your symptoms: Vegetables Tomatoes. Tomato juice. Tomato and spaghetti sauce. Chili peppers. Onion and garlic. Horseradish. Fruits Oranges, grapefruit, and lemon (fruit and juice). Meats High-fat meats, fish, and poultry. This includes hot  dogs, ribs, ham, sausage, salami, and bacon. Dairy Whole milk and chocolate milk. Sour cream. Cream. Butter. Ice cream. Cream cheese.  Beverages Coffee and tea, with or without caffeine. Carbonated beverages or energy drinks. Condiments Hot sauce. Barbecue sauce.  Sweets/Desserts Chocolate and cocoa. Donuts. Peppermint and spearmint. Fats and Oils High-fat foods, including [place] fries and potato chips. Other Vinegar. Strong spices, such as black pepper, white pepper, red pepper, cayenne, curry powder, cloves, ginger, and chili powder. The items listed above may not be a complete list of foods and beverages to avoid. Contact your dietitian for more information.   This information is not intended to replace advice given to you by your health care provider. Make sure you discuss any questions you have with your health care provider.   Document Released: 06/18/2005 Document Revised: 07/09/2014 Document Reviewed: 04/22/2013 Elsevier Interactive Patient Education [employer].

## 2015-06-07 ENCOUNTER — Telehealth: Payer: Self-pay | Admitting: Family Medicine

## 2015-06-07 DIAGNOSIS — F988 Other specified behavioral and emotional disorders with onset usually occurring in childhood and adolescence: Secondary | ICD-10-CM

## 2015-06-07 NOTE — Telephone Encounter (Signed)
Please call  Needs adderal refill , call when ready   Patient aware [name] not in office today

## 2015-06-08 MED ORDER — AMPHETAMINE-DEXTROAMPHET ER 30 MG PO CP24: 30.0000 mg | ORAL_CAPSULE | ORAL | Status: DC

## 2015-06-08 NOTE — Telephone Encounter (Signed)
Done. Please advise pt

## 2015-06-08 NOTE — Telephone Encounter (Signed)
Patient advised.

## 2015-06-14 ENCOUNTER — Telehealth: Payer: Self-pay | Admitting: Family Medicine

## 2015-06-14 NOTE — Telephone Encounter (Signed)
Per Cover [employer] Approved, called pharmacy & went thru for $36.  Called pt & informed

## 2015-06-14 NOTE — Telephone Encounter (Signed)
Pt called & states P.A. Expired.  Initiated P.A. DEXTROAMPHETAMINE-AMPHET ER 30

## 2015-07-20 ENCOUNTER — Encounter (HOSPITAL_BASED_OUTPATIENT_CLINIC_OR_DEPARTMENT_OTHER): Payer: BC Managed Care – PPO

## 2015-07-21 ENCOUNTER — Other Ambulatory Visit: Payer: Self-pay | Admitting: Family Medicine

## 2015-07-21 NOTE — Telephone Encounter (Signed)
Is this okay?

## 2015-07-27 ENCOUNTER — Telehealth: Payer: Self-pay | Admitting: *Deleted

## 2015-07-27 ENCOUNTER — Other Ambulatory Visit: Payer: Self-pay | Admitting: *Deleted

## 2015-07-27 DIAGNOSIS — D649 Anemia, unspecified: Secondary | ICD-10-CM

## 2015-07-27 NOTE — Telephone Encounter (Signed)
That was in October.  My plan had been to recheck at her next visit, sooner if she had symptoms.  Since she now has symptoms, have her return for lab visit.  I would like for her to have CBC and TSH. There is a future order for TSH already--they should NOT release the other labs! Also enter future order for CBC (since she had a mild anemia in the past, if the thyroid is normal, it would be helpful to know fatigue isn't related to anemia).

## 2015-07-27 NOTE — Telephone Encounter (Signed)
Patient called and states that she had TSH done recently and you said it was borderline and to call back before her next appt if she began having any symptoms. Lately she has been extremely fatigued, lethargic, just doesn't feel right (she can explain), but feels like she did 2 years ago when she was having thyroid issues. Please advise.

## 2015-07-28 ENCOUNTER — Other Ambulatory Visit: Payer: BC Managed Care – PPO

## 2015-07-28 DIAGNOSIS — D649 Anemia, unspecified: Secondary | ICD-10-CM

## 2015-07-28 DIAGNOSIS — E039 Hypothyroidism, unspecified: Secondary | ICD-10-CM

## 2015-07-28 LAB — CBC WITH DIFFERENTIAL/PLATELET
BASOS ABS: 0.1 10*3/uL (ref 0.0–0.1)
BASOS PCT: 2 % — AB (ref 0–1)
EOS ABS: 0.1 10*3/uL (ref 0.0–0.7)
Eosinophils Relative: 2 % (ref 0–5)
HEMATOCRIT: 36.9 % (ref 36.0–46.0)
HEMOGLOBIN: 12.5 g/dL (ref 12.0–15.0)
Lymphocytes Relative: 34 % (ref 12–46)
Lymphs Abs: 1.5 10*3/uL (ref 0.7–4.0)
MCH: 29.1 pg (ref 26.0–34.0)
MCHC: 33.9 g/dL (ref 30.0–36.0)
MCV: 85.8 fL (ref 78.0–100.0)
MONO ABS: 0.4 10*3/uL (ref 0.1–1.0)
MONOS PCT: 8 % (ref 3–12)
MPV: 8.7 fL (ref 8.6–12.4)
NEUTROS PCT: 54 % (ref 43–77)
Neutro Abs: 2.4 10*3/uL (ref 1.7–7.7)
Platelets: 195 10*3/uL (ref 150–400)
RBC: 4.3 MIL/uL (ref 3.87–5.11)
RDW: 13.4 % (ref 11.5–15.5)
WBC: 4.5 10*3/uL (ref 4.0–10.5)

## 2015-07-28 LAB — CBC
HEMATOCRIT: 36.9 % (ref 36.0–46.0)
HEMOGLOBIN: 12.5 g/dL (ref 12.0–15.0)
MCH: 29.1 pg (ref 26.0–34.0)
MCHC: 33.9 g/dL (ref 30.0–36.0)
MCV: 85.8 fL (ref 78.0–100.0)
MPV: 8.7 fL (ref 8.6–12.4)
PLATELETS: 195 10*3/uL (ref 150–400)
RBC: 4.3 MIL/uL (ref 3.87–5.11)
RDW: 13.4 % (ref 11.5–15.5)
WBC: 4.5 10*3/uL (ref 4.0–10.5)

## 2015-07-28 LAB — TSH: TSH: 3.236 u[IU]/mL (ref 0.350–4.500)

## 2015-09-05 ENCOUNTER — Ambulatory Visit (HOSPITAL_BASED_OUTPATIENT_CLINIC_OR_DEPARTMENT_OTHER): Payer: BC Managed Care – PPO

## 2015-09-12 ENCOUNTER — Telehealth: Payer: Self-pay | Admitting: Family Medicine

## 2015-09-12 DIAGNOSIS — F988 Other specified behavioral and emotional disorders with onset usually occurring in childhood and adolescence: Secondary | ICD-10-CM

## 2015-09-12 MED ORDER — AMPHETAMINE-DEXTROAMPHET ER 30 MG PO CP24: 30.0000 mg | ORAL_CAPSULE | ORAL | Status: DC

## 2015-09-12 NOTE — Telephone Encounter (Signed)
Has appt in May. Rx printed--please bring back for me to sign, and then advise when ready

## 2015-09-12 NOTE — Telephone Encounter (Signed)
Patient advised.

## 2015-09-12 NOTE — Telephone Encounter (Signed)
Requesting refill on Dextroamphetamine XR 30mg . Call pt at [phone] when script ready for pick up

## 2015-11-03 ENCOUNTER — Other Ambulatory Visit: Payer: BC Managed Care – PPO

## 2015-11-07 ENCOUNTER — Encounter: Payer: BC Managed Care – PPO | Admitting: Family Medicine

## 2015-11-24 ENCOUNTER — Encounter: Payer: Self-pay | Admitting: Family Medicine

## 2015-12-15 ENCOUNTER — Telehealth: Payer: Self-pay | Admitting: Family Medicine

## 2015-12-15 DIAGNOSIS — F988 Other specified behavioral and emotional disorders with onset usually occurring in childhood and adolescence: Secondary | ICD-10-CM

## 2015-12-15 MED ORDER — AMPHETAMINE-DEXTROAMPHET ER 30 MG PO CP24: 30.0000 mg | ORAL_CAPSULE | ORAL | Status: DC

## 2015-12-15 NOTE — Telephone Encounter (Signed)
appt 6/26.  Okay for #90.   I printed and signed, and left it on your desk, Juliann Pulse, to notify pt tomorrow

## 2015-12-15 NOTE — Telephone Encounter (Signed)
Pt called for refills of adderall xr. Please call at  [phone] when ready.

## 2015-12-16 NOTE — Telephone Encounter (Signed)
Pt informed

## 2015-12-21 ENCOUNTER — Other Ambulatory Visit: Payer: BC Managed Care – PPO

## 2015-12-26 ENCOUNTER — Ambulatory Visit (INDEPENDENT_AMBULATORY_CARE_PROVIDER_SITE_OTHER): Payer: Medicare Other | Admitting: Family Medicine

## 2015-12-26 ENCOUNTER — Encounter: Payer: Self-pay | Admitting: Family Medicine

## 2015-12-26 VITALS — BP 110/64 | HR 64 | Ht 59.0 in | Wt 106.2 lb

## 2015-12-26 DIAGNOSIS — J309 Allergic rhinitis, unspecified: Secondary | ICD-10-CM

## 2015-12-26 DIAGNOSIS — K219 Gastro-esophageal reflux disease without esophagitis: Secondary | ICD-10-CM

## 2015-12-26 DIAGNOSIS — F909 Attention-deficit hyperactivity disorder, unspecified type: Secondary | ICD-10-CM

## 2015-12-26 DIAGNOSIS — M8580 Other specified disorders of bone density and structure, unspecified site: Secondary | ICD-10-CM | POA: Diagnosis not present

## 2015-12-26 DIAGNOSIS — E7800 Pure hypercholesterolemia, unspecified: Secondary | ICD-10-CM | POA: Diagnosis not present

## 2015-12-26 DIAGNOSIS — F988 Other specified behavioral and emotional disorders with onset usually occurring in childhood and adolescence: Secondary | ICD-10-CM

## 2015-12-26 DIAGNOSIS — E039 Hypothyroidism, unspecified: Secondary | ICD-10-CM | POA: Diagnosis not present

## 2015-12-26 DIAGNOSIS — Z5181 Encounter for therapeutic drug level monitoring: Secondary | ICD-10-CM

## 2015-12-26 DIAGNOSIS — Z8249 Family history of ischemic heart disease and other diseases of the circulatory system: Secondary | ICD-10-CM | POA: Diagnosis not present

## 2015-12-26 DIAGNOSIS — Z0000 Encounter for general adult medical examination without abnormal findings: Secondary | ICD-10-CM

## 2015-12-26 DIAGNOSIS — Z1159 Encounter for screening for other viral diseases: Secondary | ICD-10-CM

## 2015-12-26 LAB — POCT URINALYSIS DIPSTICK
BILIRUBIN UA: NEGATIVE
Glucose, UA: NEGATIVE
KETONES UA: NEGATIVE
Leukocytes, UA: NEGATIVE
NITRITE UA: NEGATIVE
PH UA: 5.5
Protein, UA: NEGATIVE
RBC UA: NEGATIVE
Spec Grav, UA: >=1.03
Urobilinogen, UA: NEGATIVE

## 2015-12-26 LAB — LIPID PANEL
Cholesterol: 223 mg/dL — ABNORMAL HIGH (ref 125–200)
HDL: 93 mg/dL (ref >=46)
LDL CALC: 110 mg/dL (ref <130)
Total CHOL/HDL Ratio: 2.4 Ratio (ref <=5.0)
Triglycerides: 98 mg/dL (ref <150)
VLDL: 20 mg/dL (ref <30)

## 2015-12-26 LAB — HEPATIC FUNCTION PANEL
ALBUMIN: 4.2 g/dL (ref 3.6–5.1)
ALK PHOS: 49 U/L (ref 33–130)
ALT: 15 U/L (ref 6–29)
AST: 19 U/L (ref 10–35)
Bilirubin, Direct: 0.1 mg/dL (ref <=0.2)
Indirect Bilirubin: 0.4 mg/dL (ref 0.2–1.2)
TOTAL PROTEIN: 6.4 g/dL (ref 6.1–8.1)
Total Bilirubin: 0.5 mg/dL (ref 0.2–1.2)

## 2015-12-26 LAB — GLUCOSE, RANDOM: Glucose, Bld: 91 mg/dL (ref 65–99)

## 2015-12-26 LAB — TSH: TSH: 2.15 mIU/L

## 2015-12-26 MED ORDER — SIMVASTATIN 10 MG PO TABS: ORAL_TABLET | ORAL | Status: DC

## 2015-12-26 MED ORDER — FLUTICASONE PROPIONATE 50 MCG/ACT NA SUSP: NASAL | Status: DC

## 2015-12-26 NOTE — Patient Instructions (Signed)
HEALTH MAINTENANCE RECOMMENDATIONS:  It is recommended that you get at least 30 minutes of aerobic exercise at least 5 days/week (for weight loss, you may need as much as 60-90 minutes). This can be any activity that gets your heart rate up. This can be divided in 10-15 minute intervals if needed, but try and build up your endurance at least once a week.  Weight bearing exercise is also recommended twice weekly.  Eat a healthy diet with lots of vegetables, fruits and fiber.  "Colorful" foods have a lot of vitamins (ie green vegetables, tomatoes, red peppers, etc).  Limit sweet tea, regular sodas and alcoholic beverages, all of which has a lot of calories and sugar.  Up to 1 alcoholic drink daily may be beneficial for women (unless trying to lose weight, watch sugars).  Drink a lot of water.  Calcium recommendations are 1200-1500 mg daily (1500 mg for postmenopausal women or women without ovaries), and vitamin D 1000 IU daily.  This should be obtained from diet and/or supplements (vitamins), and calcium should not be taken all at once, but in divided doses.  Monthly self breast exams and yearly mammograms for women over the age of 56 is recommended.  Sunscreen of at least SPF 30 should be used on all sun-exposed parts of the skin when outside between the hours of 10 am and 4 pm (not just when at beach or pool, but even with exercise, golf, tennis, and yard work!)  Use a sunscreen that says "broad spectrum" so it covers both UVA and UVB rays, and make sure to reapply every 1-2 hours.  Remember to change the batteries in your smoke detectors when changing your clock times in the spring and fall.  Use your seat belt every time you are in a car, and please drive safely and not be distracted with cell phones and texting while driving.   Nicole Allen , Thank you for taking time to come for your Welcome to Medicare Visit. I appreciate your ongoing commitment to your health goals. Please review the following  plan we discussed and let me know if I can assist you in the future.   These are the goals we discussed: Goals    None      This is a list of the screening recommended for you and due dates:  Health Maintenance  Topic Date Due  .  Hepatitis C: One time screening is recommended by Center for Disease Control  (CDC) for  adults born from [birth year] through 1965.   02-27-1951  . HIV Screening  08/05/1965  . Pap Smear  12/30/2013  . Mammogram  09/13/2014  . Pneumonia vaccines (1 of 2 - PCV13) 08/06/2015  . Tetanus Vaccine  12/27/2015  . Flu Shot  01/31/2016  . Colon Cancer Screening  12/18/2021  . DEXA scan (bone density measurement)  Completed  . Shingles Vaccine  Completed   Bone density is recommended to be repeated in January 2018--discuss this with Dr. Valentino Saxon Your next colonoscopy is due 11/2016 (NOT 2023). Continue to get yearly flu shots--I recommend the high dose flu shot (for 65 and over)--this won't be 01/31/16, but in the Fall (October). You are due for a tetanus booster, as well as the Prevnar-13, and you were given a written prescription to take to the pharmacy to get these. Remember to let us know the date you got them (and verify the type of tetanus shot you received, you technically only need the Td, not the TdaP since  you had one in the past). Continue yearly mammograms, due now, and please have them send [place] the reports. We don't have records of your pap smears, which is why the computer states you are past due. (I know you aren't).  Hepatitis C screening is being done with today's labs.

## 2015-12-26 NOTE — Progress Notes (Signed)
Chief Complaint  Patient presents with  . Medicare Wellness    fasting welcome to medicare, no pap-sees Dr.Fogleman and is UTD. Did not do eye exam, just had cataract surgery with Dr.Hecker. Her sleep has worsened. Will reschedule her sleep study.    Nicole Allen is a 65 y.o. female who presents for Welcome to Medicare physical and follow-up on chronic medical conditions.  She has the following concerns:  She wants to see a cardiologist.  Her sister sees one, and she would like to given her family history. She denies any exertional dyspnea or shortness of breath, gets regular exercise, takes daily aspirin 81mg , and is on a low dose statin with excellent HDL.  EKG is being performed today as part of Welcome to Medicare physical.  Regardless, she would like consult with cardiologist.  GERD: At her last visit (October) she was needing pepcid twice daily as well as tums multiple times each day, despite trying to be careful with her diet.  She was prescribed omeprazole 40mg , she didn't even finish the first bottle (it helped).  She is back to taking Pepcid 20mg  twice daily, and no longer needs daily Tums, just occasional.  Hyperlipidemia: She was tried off simvastatin (she was only on very low dose, tried without--still high, so then we tried treating mild hypothyroidism, but lipids didn't change). Went back on 10mg  of simvastatin (given family h/o CAD). Tolerating it fine without any side effects. She did not come for her labs prior to today's appointment (had future orders for LFT's and lipid panel; rest of labs done in October).  Hypothyroidism: She has been on 67mcg. She states that her hair loss has improved dramatically since being on the Synthroid.Over the winter she felt like things were off, but TSH was fine, thinks it was related to the short days.  Things got better, back to baseline.  She has been taking it on empty stomach separate from medications.   [id number] year her Adderall  dose was increased to 30mg , and is doing well on that dose. No side effects.   Insomnia--Trazadone is working very well for her overall. Uses it nightly, and denies side effects. About 1/3rd of the time she finds that she needs to take a second pill of trazadone, if she doesn't fall asleep after about an hour or two with just 1 pill. She admits to getting into a bad sleep pattern with her knee surgery, due to pain, then related to her mother's illness and death--it was very traumatic for her. She can't stop thinking about it when trying to fall asleep at night.  She reported at her last visit (October) that she gets sleepy on long car rides, can't drive over an hour and a half without getting very sleepy. Her sister has sleep apnea. She knows she snores, not aware of apnea.  She was referred for a split night sleep study, but she had to cancel it (twice). She plans to reschedule it.  Allergies:Allergies are doing very well on the Flonase and Allegra.  Osteopenia: She had DEXA ordered by GYN in January, 2016.Results showed that decline noted. T-2.0. She was told to stop taking calcium supplements due to family h/o heart disease.  Immunization History  Administered Date(s) Administered  . Influenza Split 04/23/2011  . Influenza, Seasonal, Injecte, Preservative Fre 06/09/2012  . Influenza,inj,Quad PF,36+ Mos 04/22/2014, 04/14/2015  . Tdap 12/26/2005  . Zoster 05/14/2011   Last Pap smear: UTD through GYN, sees Dr. Pamala Hurry in the summer (next  week). Last mammogram: She states she had mammogram at Dr. Earl Many office last year, hasn't decided if she will get it there again this year vs go back to [facility]. Last colonoscopy: 12/2011, small polyp, due again in 5 years  Last DEXA: 07/2014 Ophtho: yearly  Dentist: every 6 months  Exercise: nothing for 2.5 months when her mother was very ill, and then had cataract surgery.  Usual routine [employer] 3-5x/week for 1 hour class (strength,  cardio) Vitamin D was 64 in May 2015  Other doctors caring for patient include: GYN: Dr. Valentino Saxon Ophtho: Dr. Herbert Deaner (and Dr. Marica Otter as Optometrist) GI: Dr. Collene Mares Dermatologist: Dr. Tonia Brooms  Depression screen: negative Fall screen: negative Functional Status screen signficant only for known chronic hearing loss, improving vision (s/p recent cataract surgery), and known ADHD. See full questionnaires in epic.  End of Life Discussion:  Patient has a living will and medical power of attorney. She was asked to get copies to [place].  Past Medical History  Diagnosis Date  . Allergy     SEASONAL  . ADD (attention deficit disorder)   . Postmenopausal     on HRT--sees GYN  . Kidney stone     x3, Dr. Reece Agar  . Pure hypercholesterolemia   . Osteopenia   . BCC (basal cell carcinoma of skin)     chest; Dr. Tonia Brooms  . Cervical spondylosis   . Insomnia     worse with menopause  . Hearing loss     high frequency (Dr. Prescott Parma); hearing aids recommended; bilateral  . History of kidney stones     x3  . BCC (basal cell carcinoma)     Tonia Brooms  . Hypothyroidism 2015    Past Surgical History  Procedure Laterality Date  . Tonsillectomy and adenoidectomy    . Hymenectomy  1993  . Tubal ligation  1991  . Cesarean section  [id number]  . Colonoscopy  2003, 2013    Dr. Collene Mares  . Knee surgery Right 09/29/2014    Dr. Jim Desanctis replacement/reconstruction  . Cataract extraction Bilateral L 10/18/15 R 12/13/15    Dr. Herbert Deaner    Social History   Social History  . Marital Status: Married    Spouse Name: N/A  . Number of Children: 2  . Years of Education: N/A   Occupational History  . retired Teaching laboratory technician    Social History Main Topics  . Smoking status: Never Smoker   . Smokeless tobacco: Never Used  . Alcohol Use: 0.0 oz/week    0 Standard drinks or equivalent per week     Comment: 5-7/week (2 drinks on the weekends, occasional glass during the week once or twice).  . Drug Use: No   . Sexual Activity:    Partners: Male   Other Topics Concern  . Not on file   Social History Narrative   Lives with husband.  Son in [place], [place] in [place], Daughter is in [place]. Grandson (born 04/2015, in [place])    Family History  Problem Relation Age of Onset  . Asthma Mother   . Arthritis Mother   . Hypertension Mother   . Hyperlipidemia Mother   . Osteoporosis Mother   . Hypertension Father   . Heart disease Father     14's  . Hyperlipidemia Father   . Alzheimer's disease Maternal Grandmother   . Cancer Maternal Grandfather     lung  . Cancer Paternal Grandfather     lung  . ADD / ADHD Daughter   .  ADD / ADHD Son     Outpatient Encounter Prescriptions as of 12/26/2015  Medication Sig Note  . amphetamine-dextroamphetamine (ADDERALL XR) 30 MG 24 hr capsule Take 1 capsule (30 mg total) by mouth every morning.   Marland Kitchen aspirin 81 MG tablet Take 81 mg by mouth daily. 04/14/2015: .   . calcium carbonate (TUMS - DOSED IN MG ELEMENTAL CALCIUM) 500 MG chewable tablet Chew 1-2 tablets by mouth as needed for indigestion or heartburn. 12/26/2015: Uses prn, not daily  . famotidine (PEPCID) 20 MG tablet Take 20 mg by mouth 2 (two) times daily.  04/14/2015: Taking twice daily  . fexofenadine (ALLEGRA) 180 MG tablet Take 180 mg by mouth daily.   . fluticasone (FLONASE) 50 MCG/ACT nasal spray USE 2 SPRAYS IN EACH NOSTRIL ONCE A DAY.   Marland Kitchen Omega-3 Fatty Acids (FISH OIL) 1000 MG CAPS Take 1 capsule by mouth daily.   . simvastatin (ZOCOR) 10 MG tablet TAKE ONE TABLET AT BEDTIME.   . SYNTHROID 25 MCG tablet TAKE 1 TABLET ONCE DAILY BEFORE BREAKFAST.   Marland Kitchen traZODone (DESYREL) 50 MG tablet TAKE 1 OR 2 TABLETS AT BEDTIME AS NEEDED FOR SLEEP. 12/26/2015: Takes 1 tablet, and takes a second one prn  . ESTRING 2 MG vaginal ring Place 2 mg vaginally every 3 (three) months. Reported on 12/26/2015 10/13/2014: Received from: External Pharmacy  . [DISCONTINUED] dexlansoprazole (DEXILANT) 60 MG capsule Take 1 capsule  (60 mg total) by mouth daily. (Patient not taking: Reported on 04/14/2015)   . [DISCONTINUED] omeprazole (PRILOSEC) 40 MG capsule Take 1 capsule (40 mg total) by mouth daily.    No facility-administered encounter medications on file as of 12/26/2015.    Allergies  Allergen Reactions  . Morphine And Related Other (See Comments)    Family history.  . Codeine Itching and Rash    ROS: The patient denies fever, weight changes, headaches, vision changes (improved after surgery), ear pain, sore throat, breast concerns, chest pain, palpitations, dizziness, syncope, dyspnea on exertion, cough, swelling, nausea, vomiting, diarrhea, constipation, abdominal pain, melena, hematochezia, hematuria, incontinence, dysuria, vaginal bleeding, discharge, odor or itch, genital lesions, joint pains (other than her knee), numbness, tingling, weakness, tremor, suspicious skin lesions, depression, anxiety, abnormal bleeding/bruising, or enlarged lymph nodes.  +bilateral hearing loss, unchanged Some bilateral knee pain and swelling (doesn't limit her activity, but hasn't been very active recently)   PHYSICAL EXAM:  BP 110/64 mmHg  Pulse 64  Ht 4\' 11"  (1.499 m)  Wt 106 lb 3.2 oz (48.172 kg)  BMI 21.44 kg/m2   General Appearance:  Alert, cooperative, no distress, appears stated age   Head:  Normocephalic, without obvious abnormality, atraumatic   Eyes:  PERRL, conjunctiva/corneas clear, EOM's intact, fundi  benign   Ears:  Normal TM's and external ear canals   Nose:  Nares normal, mucosa mildly edematous, no drainage or sinus tenderness   Throat:  Lips, mucosa, and tongue normal; teeth and gums normal.   Neck:  Supple, no lymphadenopathy; thyroid: no enlargement/tenderness/nodules; no carotid  bruit or JVD   Back:  Spine nontender, no curvature, ROM normal, no CVA tenderness   Lungs:  Clear to auscultation bilaterally without wheezes, rales or ronchi; respirations  unlabored   Chest Wall:  No tenderness or deformity   Heart:  Regular rate and rhythm, S1 and S2 normal, no murmur, rub  or gallop   Breast Exam:  Deferred to GYN   Abdomen:  Soft, non-tender, nondistended, normoactive bowel sounds,  no masses, no hepatosplenomegaly  Genitalia:  Deferred to GYN      Extremities:  No clubbing, cyanosis or edema   Pulses:  2+ and symmetric all extremities   Skin:  Skin color, texture, turgor normal. No rash or suspicious lesions  Lymph nodes:  Cervical, supraclavicular, and axillary nodes normal   Neurologic:  CNII-XII intact, normal strength, sensation and gait; reflexes 2+ and symmetric throughout   Psych: Normal mood, affect, hygiene and grooming. Normal eye contact and speech.  Not quite in her usual good spirits--it appears that the trouble sleeping and trauma related to her mother's death is affecting her some                ASSESSMENT/PLAN:  Welcome to Medicare preventive visit - Plan: POCT Urinalysis Dipstick, Glucose, random, EKG 12-Lead  Pure hypercholesterolemia - continue statin - Plan: simvastatin (ZOCOR) 10 MG tablet, Hepatic function panel, Lipid panel  ADD (attention deficit disorder) - controlled  Hypothyroidism, unspecified hypothyroidism type - Plan: TSH  Osteopenia - discussed calcium, vitamin D and weight-bearing eercise recommendations. DEXA due 07/2016 (prev ordered by GYN, discuss with her)  Gastroesophageal reflux disease, esophagitis presence not specified - improved control, on H2 blocker PPI. Change to PPI if poorly controlled  Allergic rhinitis, unspecified allergic rhinitis type - controlled - Plan: fluticasone (FLONASE) 50 MCG/ACT nasal spray  Medication monitoring encounter - Plan: Hepatic function panel, Lipid panel  Need for hepatitis C screening test - Plan: Hepatitis C antibody   Refer to female cardiologist due to patient  request. Pt anxious and prefers to have consult. Continue aspirin, statin, regular exercise.  Encouraged grief counseling.  Anxiety is contributing to worsened sleep pattern.   Discussed monthly self breast exams and yearly mammograms; at least 30 minutes of aerobic activity at least 5 days/week, weight-bearing exercise 2x/wk; proper sunscreen use reviewed; healthy diet, including goals of calcium and vitamin D intake and alcohol recommendations (less than or equal to 1 drink/day) reviewed; regular seatbelt use; changing batteries in smoke detectors. Immunization recommendations discussed--Prevnar-13--declines today (doesn't want sore arm when caring for grandbaby), will return for [place], continue yearly flu shots--change to high dose.Td due--not covered by medicare so written rx given. Colonoscopy recommendations reviewed--UTD, due 11/2016. DEXA due again 07/2016 (likely to be ordered through her GYN).  Prevnar-13 and Td are due--patient will get both at the pharmacy and let us know the date. Written rx given.  Patient to reschedule sleep study. I doubt there will be sleep apnea, but wouldn't be surprised if there was PLMS (RLS) which could affect her sleep quality and might benefit from treatment.  Pt encouraged to get [place] copies of her Living Will and [place]. She is Full Code/Care.  Visit was over an hour today  Medicare Attestation I have personally reviewed: The patient's medical and social history Their use of alcohol, tobacco or illicit drugs Their current medications and supplements The patient's functional ability including ADLs,fall risks, home safety risks, cognitive, and hearing and visual impairment Diet and physical activities Evidence for depression or mood disorders  The patient's weight, height, BMI  have been recorded in the chart.  I have made referrals, counseling, and provided education to the patient based on review of the above and I have provided  the patient with a written personalized care plan for preventive services.     Kiylah Loyer A, MD   12/26/2015

## 2015-12-27 ENCOUNTER — Other Ambulatory Visit: Payer: Self-pay | Admitting: Family Medicine

## 2015-12-27 DIAGNOSIS — E039 Hypothyroidism, unspecified: Secondary | ICD-10-CM

## 2015-12-27 DIAGNOSIS — E7800 Pure hypercholesterolemia, unspecified: Secondary | ICD-10-CM

## 2015-12-27 DIAGNOSIS — Z5181 Encounter for therapeutic drug level monitoring: Secondary | ICD-10-CM

## 2015-12-27 LAB — HEPATITIS C ANTIBODY: HCV AB: NEGATIVE

## 2016-02-24 ENCOUNTER — Ambulatory Visit (HOSPITAL_BASED_OUTPATIENT_CLINIC_OR_DEPARTMENT_OTHER): Payer: Medicare Other | Attending: Family Medicine | Admitting: Internal Medicine

## 2016-02-24 VITALS — Ht 59.0 in | Wt 106.0 lb

## 2016-02-24 DIAGNOSIS — Z79899 Other long term (current) drug therapy: Secondary | ICD-10-CM | POA: Insufficient documentation

## 2016-02-24 DIAGNOSIS — R0683 Snoring: Secondary | ICD-10-CM | POA: Insufficient documentation

## 2016-02-24 DIAGNOSIS — Z7982 Long term (current) use of aspirin: Secondary | ICD-10-CM | POA: Diagnosis not present

## 2016-02-24 DIAGNOSIS — R400 Somnolence: Secondary | ICD-10-CM

## 2016-02-24 DIAGNOSIS — R5383 Other fatigue: Secondary | ICD-10-CM | POA: Insufficient documentation

## 2016-02-24 DIAGNOSIS — G4733 Obstructive sleep apnea (adult) (pediatric): Secondary | ICD-10-CM | POA: Insufficient documentation

## 2016-02-24 DIAGNOSIS — G4719 Other hypersomnia: Secondary | ICD-10-CM | POA: Diagnosis not present

## 2016-02-24 DIAGNOSIS — G4700 Insomnia, unspecified: Secondary | ICD-10-CM | POA: Diagnosis not present

## 2016-02-24 DIAGNOSIS — G4761 Periodic limb movement disorder: Secondary | ICD-10-CM | POA: Insufficient documentation

## 2016-02-25 ENCOUNTER — Other Ambulatory Visit: Payer: Self-pay | Admitting: Family Medicine

## 2016-02-27 NOTE — Telephone Encounter (Signed)
Is this okay to refill? 

## 2016-03-11 DIAGNOSIS — G4733 Obstructive sleep apnea (adult) (pediatric): Secondary | ICD-10-CM

## 2016-03-11 NOTE — Procedures (Signed)
   Patient Name: Nicole Allen, Nicole Allen Date: 02/24/2016 Gender: Female D.O.B: 10/12/1950 Age (years): 87 Referring Provider: Rita Ohara Height (inches): 64 Interpreting Physician: Baird Lyons MD, ABSM Weight (lbs): 106 RPSGT: Jonna Coup BMI: 21 MRN: [id number] Neck Size: 12.50 CLINICAL INFORMATION Sleep Study Type: NPSG Indication for sleep study: Excessive Daytime Sleepiness, Fatigue Epworth Sleepiness Score: 0  SLEEP STUDY TECHNIQUE As per the AASM Manual for the Scoring of Sleep and Associated Events v2.3 (April 2016) with a hypopnea requiring 4% desaturations. The channels recorded and monitored were frontal, central and occipital EEG, electrooculogram (EOG), submentalis EMG (chin), nasal and oral airflow, thoracic and abdominal wall motion, anterior tibialis EMG, snore microphone, electrocardiogram, and pulse oximetry.  MEDICATIONS Patient's medications include: charted for review. Medications self-administered by patient during sleep study : simvastatin, famotidine, trazodone, asa..  SLEEP ARCHITECTURE The study was initiated at 10:41:50 PM and ended at 4:43:13 AM. Sleep onset time was 37.2 minutes and the sleep efficiency was 51.7%. The total sleep time was 187.0 minutes. Stage REM latency was 266.5 minutes. The patient spent 5.88% of the night in stage N1 sleep, 90.11% in stage N2 sleep, 0.00% in stage N3 and 4.01% in REM. Alpha intrusion was absent. Supine sleep was 38.50%. Wake after sleep onset 137 minutes  RESPIRATORY PARAMETERS The overall apnea/hypopnea index (AHI) was 4.5 per hour. There were 4 total apneas, including 2 obstructive, 2 central and 0 mixed apneas. There were 10 hypopneas and 17 RERAs. The AHI during Stage REM sleep was 0.0 per hour. AHI while supine was 11.7 per hour. The mean oxygen saturation was 95.76%. The minimum SpO2 during sleep was 92.00%. Moderate snoring was noted during this study.  CARDIAC DATA The 2 lead EKG demonstrated  sinus rhythm. The mean heart rate was 68.22 beats per minute. Other EKG findings include: None.  LEG MOVEMENT DATA The total PLMS were 51 with a resulting PLMS index of 16.36. Associated arousal with leg movement index was 5.8 .  IMPRESSIONS - No significant obstructive sleep apnea occurred during this study (AHI = 4.5/h). - No significant central sleep apnea occurred during this study (CAI = 0.6/h). - The patient had minimal or no oxygen desaturation during the study (Min O2 = 92.00%) - The patient snored with Moderate snoring volume. - No cardiac abnormalities were noted during this study. - Mild periodic limb movements of sleep occurred during the study. Associated arousals were significant. - Difficulty initiating and maintaining sleep- Insomnia. Patient used a "white noise machine".  DIAGNOSIS - Periodic Limb Movement Syndrome (327.51 [G47.61 ICD-10]) - Primary Snoring (786.09 [R06.83 ICD-10]) - Insomnia  RECOMMENDATIONS - Positional therapy avoiding supine position during sleep. - Consider therapy directed at insomnia and at periodic limb movement sleep disorder. - Sleep hygiene should be reviewed to assess factors that may improve sleep quality. - Weight management and regular exercise should be initiated or continued if appropriate.  [Electronically signed] 03/11/2016 03:16 PM  Baird Lyons MD, [place], American Board of Sleep Medicine   NPI: [id number]  [place], American Board of Sleep Medicine  ELECTRONICALLY SIGNED ON:  03/11/2016, 3:16 PM [place] PH: [phone]   FX: [phone] [place]

## 2016-03-23 ENCOUNTER — Ambulatory Visit: Payer: BC Managed Care – PPO | Admitting: Internal Medicine

## 2016-03-27 ENCOUNTER — Telehealth: Payer: Self-pay | Admitting: Family Medicine

## 2016-03-27 DIAGNOSIS — F988 Other specified behavioral and emotional disorders with onset usually occurring in childhood and adolescence: Secondary | ICD-10-CM

## 2016-03-27 MED ORDER — AMPHETAMINE-DEXTROAMPHET ER 30 MG PO CP24: 30.0000 mg | ORAL_CAPSULE | ORAL | 0 refills | Status: DC

## 2016-03-27 NOTE — Telephone Encounter (Signed)
Placed in your folder for signature 

## 2016-03-27 NOTE — Telephone Encounter (Signed)
Ok to print rx for #90, and advise pt after I've signed (on Wed.)

## 2016-03-27 NOTE — Telephone Encounter (Signed)
Pt called for refills of adderall xr. Pt was informed [name] not in today and will be handled tomorrow.

## 2016-03-28 ENCOUNTER — Telehealth: Payer: Self-pay | Admitting: Family Medicine

## 2016-03-28 NOTE — Telephone Encounter (Signed)
Pt said she hasn't heard back on her sleep study results.  Please call

## 2016-03-28 NOTE — Telephone Encounter (Signed)
Send my apologies for not contacting her with results.  It showed snoring, insomnia and periodic limb movement disorder (ie restless legs). No sleep apnea was noted.  If interested in discussing/treating restless legs, have her schedule OV to discuss treatment options.

## 2016-03-29 NOTE — Telephone Encounter (Signed)
Left message for patient informing her of this.

## 2016-04-05 ENCOUNTER — Ambulatory Visit (INDEPENDENT_AMBULATORY_CARE_PROVIDER_SITE_OTHER): Payer: Medicare Other | Admitting: Internal Medicine

## 2016-04-05 ENCOUNTER — Encounter: Payer: Self-pay | Admitting: Internal Medicine

## 2016-04-05 VITALS — BP 112/78 | HR 82 | Ht 59.0 in | Wt 107.6 lb

## 2016-04-05 DIAGNOSIS — Z8249 Family history of ischemic heart disease and other diseases of the circulatory system: Secondary | ICD-10-CM

## 2016-04-05 NOTE — Progress Notes (Signed)
Cardiology Office Note   Date:  04/05/2016   ID:  Nicole Allen, DOB 06-21-1951, MRN [id number]  PCP:  Vikki Ports, MD  Cardiologist:   Dorris Carnes, MD   Pt presents for eval of hyperlipidemia    History of Present Illness: Nicole Allen is a 65 y.o. female with a history of dyslipidiemia  FHx foather dietd oat 21  Hx CAD /stents   Followed in IM  Lipids in June LDL was 110  HDL 93  Total cholesterol 223.  Currently on simvisatin 10 mg qhs She denies CP  Breathing is OK       Outpatient Medications Prior to Visit  Medication Sig Dispense Refill  . amphetamine-dextroamphetamine (ADDERALL XR) 30 MG 24 hr capsule Take 1 capsule (30 mg total) by mouth every morning. 90 capsule 0  . aspirin 81 MG tablet Take 81 mg by mouth daily.    . calcium carbonate (TUMS - DOSED IN MG ELEMENTAL CALCIUM) 500 MG chewable tablet Chew 1-2 tablets by mouth as needed for indigestion or heartburn.    . famotidine (PEPCID) 20 MG tablet Take 20 mg by mouth 2 (two) times daily.     . fexofenadine (ALLEGRA) 180 MG tablet Take 180 mg by mouth daily.    . fluticasone (FLONASE) 50 MCG/ACT nasal spray USE 2 SPRAYS IN EACH NOSTRIL ONCE A DAY. 16 g 11  . Omega-3 Fatty Acids (FISH OIL) 1000 MG CAPS Take 1 capsule by mouth daily.    . simvastatin (ZOCOR) 10 MG tablet TAKE ONE TABLET AT BEDTIME. 90 tablet 1  . SYNTHROID 25 MCG tablet TAKE 1 TABLET ONCE DAILY BEFORE BREAKFAST. 90 tablet 3  . traZODone (DESYREL) 50 MG tablet TAKE 1 OR 2 TABLETS AT BEDTIME AS NEEDED FOR SLEEP. 60 tablet 5  . ESTRING 2 MG vaginal ring Place 2 mg vaginally every 3 (three) months. Reported on 12/26/2015     No facility-administered medications prior to visit.      Allergies:   Morphine and related and Codeine   Past Medical History:  Diagnosis Date  . ADD (attention deficit disorder)   . Allergy    SEASONAL  . BCC (basal cell carcinoma of skin)    chest; Dr. Tonia Brooms  . BCC (basal cell carcinoma)    Tonia Brooms  . Cervical  spondylosis   . Hearing loss    high frequency (Dr. Prescott Parma); hearing aids recommended; bilateral  . History of kidney stones    x3  . Hypothyroidism 2015  . Insomnia    worse with menopause  . Kidney stone    x3, Dr. Reece Agar  . Osteopenia   . Postmenopausal    on HRT--sees GYN  . Pure hypercholesterolemia     Past Surgical History:  Procedure Laterality Date  . CATARACT EXTRACTION Bilateral L 10/18/15 R 12/13/15   Dr. Herbert Deaner  . CESAREAN SECTION  [id number]  . COLONOSCOPY  2003, 2013   Dr. Collene Mares  . HYMENECTOMY  1993  . KNEE SURGERY Right 09/29/2014   Dr. Jim Desanctis replacement/reconstruction  . TONSILLECTOMY AND ADENOIDECTOMY    . TUBAL LIGATION  1991     Social History:  The patient  reports that she has never smoked. She has never used smokeless tobacco. She reports that she drinks alcohol. She reports that she does not use drugs.   Family History:  The patient's family history includes ADD / ADHD in her daughter and son; Alzheimer's disease in her maternal grandmother; Arthritis in her mother;  Asthma in her mother; Cancer in her maternal grandfather and paternal grandfather; Heart disease in her father; Hyperlipidemia in her father and mother; Hypertension in her father and mother; Osteoporosis in her mother.    ROS:  Please see the history of present illness. All other systems are reviewed and  Negative to the above problem except as noted.    PHYSICAL EXAM: VS:  BP 112/78   Pulse 82   Ht 4\' 11"  (1.499 m)   Wt 107 lb 9.6 oz (48.8 kg)   SpO2 99%   BMI 21.73 kg/m   GEN: Well nourished, well developed, in no acute distress  HEENT: normal  Neck: no JVD, carotid bruits, or masses Cardiac: RRR; no murmurs, rubs, or gallops,no edema  Respiratory:  clear to auscultation bilaterally, normal work of breathing GI: soft, nontender, nondistended, + BS  No hepatomegaly  MS: no deformity Moving all extremities   Skin: warm and dry, no rash Neuro:  Strength and sensation are  intact Psych: euthymic mood, full affect   EKG:  EKG is ordered today.  In June SR 69 bpm     Lipid Panel    Component Value Date/Time   CHOL 223 (H) 12/26/2015 0912   TRIG 98 12/26/2015 0912   HDL 93 12/26/2015 0912   CHOLHDL 2.4 12/26/2015 0912   VLDL 20 12/26/2015 0912   LDLCALC 110 12/26/2015 0912      Wt Readings from Last 3 Encounters:  04/05/16 107 lb 9.6 oz (48.8 kg)  02/24/16 106 lb (48.1 kg)  12/26/15 106 lb 3.2 oz (48.2 kg)      ASSESSMENT AND PLAN:  Pt is a 65 yo with Fhx of CAD  No symptoms herself   I reviewed lipid panel with her   I would recomm Calcium score CT to evaluate for plaquing and define further risk factor modification.     Current medicines are reviewed at length with the patient today.  The patient does not have concerns regarding medicines.  Signed, Dorris Carnes, MD  04/05/2016 12:35 PM    [address], [place], [place]  [address] Phone: [phone]; Fax: [phone]

## 2016-04-05 NOTE — Patient Instructions (Signed)
Your physician recommends that you continue on your current medications as directed. Please refer to the Current Medication list given to you today.  Check out will schedule your Calcium Score CT scan today. The cost is $150 out of pocket.  Follow up with your physician will depend on test results.

## 2016-04-10 ENCOUNTER — Ambulatory Visit (INDEPENDENT_AMBULATORY_CARE_PROVIDER_SITE_OTHER)
Admission: RE | Admit: 2016-04-10 | Discharge: 2016-04-10 | Disposition: A | Discharge status: Home / Self Care | Payer: Medicare Other | Source: Ambulatory Visit | Attending: Internal Medicine | Admitting: Internal Medicine

## 2016-04-10 DIAGNOSIS — Z8249 Family history of ischemic heart disease and other diseases of the circulatory system: Secondary | ICD-10-CM

## 2016-04-16 ENCOUNTER — Other Ambulatory Visit: Payer: Self-pay | Admitting: Family Medicine

## 2016-04-16 NOTE — Telephone Encounter (Signed)
V this is one of yalls pt

## 2016-06-12 ENCOUNTER — Telehealth: Payer: Self-pay | Admitting: Family Medicine

## 2016-06-12 NOTE — Telephone Encounter (Signed)
She has Medicare.  They will cover for her to have prevnar-13 at our office, but they don't cover Td (except as part of part D, gotten at pharmacy) . I encourage her to get Td (tetanus) at the pharmacy (and be sure to let us know the date she got it), or let her know the cost of getting it at our office.  She can get Prevnar and flu shot at the same time 12/19. If she is willing to pay for the Td, she can get all 3 at the same time, or can get whenever she prefers (her last one was 11/2005, so she is past due).

## 2016-06-12 NOTE — Telephone Encounter (Signed)
Pt said she was given a script to get TD & Prevnar 13 at a pharmacy but she is having a difficult time finding a pharmacy willing to give both to her so pt wants to go ahead and get these done here at our office. Is this ok? She would like to get Prevnar done on 12/19 when she come in for the flu shot and then she will possibly have TD done when she comes in for her appointment with Dr Tomi Bamberger.

## 2016-06-19 ENCOUNTER — Other Ambulatory Visit: Payer: Medicare Other

## 2016-06-21 ENCOUNTER — Telehealth: Payer: Self-pay

## 2016-06-21 DIAGNOSIS — F988 Other specified behavioral and emotional disorders with onset usually occurring in childhood and adolescence: Secondary | ICD-10-CM

## 2016-06-21 MED ORDER — AMPHETAMINE-DEXTROAMPHET ER 30 MG PO CP24: 30.0000 mg | ORAL_CAPSULE | ORAL | 0 refills | Status: DC

## 2016-06-21 NOTE — Telephone Encounter (Signed)
Printed--please advise pt ready to pick up

## 2016-06-21 NOTE — Telephone Encounter (Signed)
Pt states that she needs refill of adderall, she is due for refill on 06/27/2016. She would like to pick it up this day.  [phone].

## 2016-06-21 NOTE — Telephone Encounter (Signed)
Pt notified ready for pickup.

## 2016-07-04 ENCOUNTER — Encounter: Payer: Medicare Other | Admitting: Family Medicine

## 2016-07-05 ENCOUNTER — Other Ambulatory Visit: Payer: Medicare Other

## 2016-07-05 DIAGNOSIS — Z5181 Encounter for therapeutic drug level monitoring: Secondary | ICD-10-CM

## 2016-07-05 DIAGNOSIS — E7800 Pure hypercholesterolemia, unspecified: Secondary | ICD-10-CM

## 2016-07-05 DIAGNOSIS — E039 Hypothyroidism, unspecified: Secondary | ICD-10-CM

## 2016-07-05 LAB — CBC WITH DIFFERENTIAL/PLATELET
Basophils Absolute: 49 cells/uL (ref 0–200)
Basophils Relative: 1 %
EOS ABS: 196 {cells}/uL (ref 15–500)
Eosinophils Relative: 4 %
HEMATOCRIT: 35.6 % (ref 35.0–45.0)
Hemoglobin: 11.9 g/dL (ref 11.7–15.5)
LYMPHS PCT: 34 %
Lymphs Abs: 1666 cells/uL (ref 850–3900)
MCH: 29.5 pg (ref 27.0–33.0)
MCHC: 33.4 g/dL (ref 32.0–36.0)
MCV: 88.3 fL (ref 80.0–100.0)
MONO ABS: 637 {cells}/uL (ref 200–950)
MPV: 8.7 fL (ref 7.5–12.5)
Monocytes Relative: 13 %
NEUTROS ABS: 2352 {cells}/uL (ref 1500–7800)
Neutrophils Relative %: 48 %
Platelets: 201 10*3/uL (ref 140–400)
RBC: 4.03 MIL/uL (ref 3.80–5.10)
RDW: 13.6 % (ref 11.0–15.0)
WBC: 4.9 10*3/uL (ref 4.0–10.5)

## 2016-07-05 LAB — TSH: TSH: 4.68 mIU/L — ABNORMAL HIGH

## 2016-07-05 LAB — COMPREHENSIVE METABOLIC PANEL
ALT: 12 U/L (ref 6–29)
AST: 18 U/L (ref 10–35)
Albumin: 4.1 g/dL (ref 3.6–5.1)
Alkaline Phosphatase: 42 U/L (ref 33–130)
BILIRUBIN TOTAL: 0.6 mg/dL (ref 0.2–1.2)
BUN: 15 mg/dL (ref 7–25)
CALCIUM: 8.7 mg/dL (ref 8.6–10.4)
CO2: 25 mmol/L (ref 20–31)
Chloride: 105 mmol/L (ref 98–110)
Creat: 0.63 mg/dL (ref 0.50–0.99)
Glucose, Bld: 92 mg/dL (ref 65–99)
Potassium: 4.1 mmol/L (ref 3.5–5.3)
Sodium: 138 mmol/L (ref 135–146)
Total Protein: 6.1 g/dL (ref 6.1–8.1)

## 2016-07-05 LAB — LIPID PANEL
CHOL/HDL RATIO: 2.1 ratio (ref <5.0)
Cholesterol: 188 mg/dL (ref <200)
HDL: 91 mg/dL (ref >50)
LDL CALC: 76 mg/dL (ref <100)
Triglycerides: 107 mg/dL (ref <150)
VLDL: 21 mg/dL (ref <30)

## 2016-07-08 DIAGNOSIS — G4761 Periodic limb movement disorder: Secondary | ICD-10-CM | POA: Insufficient documentation

## 2016-07-08 NOTE — Progress Notes (Deleted)
Hyperlipidemia: She was tried off simvastatin (she was only on very low dose, tried without--still high, so then we tried treating mild hypothyroidism, but lipids didn't change). Went back on 10mg  of simvastatin (given family h/o CAD). Tolerating it fine without any side effects. She saw Dr. Dorris Carnes in October, who sent her for Ca score CT, which showed Ca score of 0, no evidence of calcified plaque.  Suggested cutting back to dose of 5mg  of simvastatin consider stopping.  Hypothyroidism: She has been on 60mcg. She states that her hair loss has improved dramatically since being on the Synthroid. She has been taking it on empty stomach separate from medications.   ADD:  Doing well on 30mg  of Adderall. Denies side effects.   Insomnia--Trazadone is working very well for her overall. Uses it nightly, and denies side effects. About 1/3rd of the time she finds that she needs to take a second pill of trazadone, if she doesn't fall asleep after about an hour or two with just 1 pill. She admits to getting into a bad sleep pattern with her knee surgery, due to pain, then related to her mother's illness and death--it was very traumatic for her. She can't stop thinking about it when trying to fall asleep at night.  A sleep study was performed in August, due to some reports of daytime somnolence (during car rides >1.5 hrs, family h/o OSA in her sister, and known snoring. It showed: IMPRESSIONS - No significant obstructive sleep apnea occurred during this study (AHI = 4.5/h). - No significant central sleep apnea occurred during this study (CAI = 0.6/h). - The patient had minimal or no oxygen desaturation during the study (Min O2 = 92.00%) - The patient snored with Moderate snoring volume. - No cardiac abnormalities were noted during this study. - Mild periodic limb movements of sleep occurred during the study. Associated arousals were significant. - Difficulty initiating and maintaining sleep-  Insomnia. Patient used a "white noise machine".  DIAGNOSIS - Periodic Limb Movement Syndrome (327.51 [G47.61 ICD-10]) - Primary Snoring (786.09 [R06.83 ICD-10]) - Insomnia  GERD: She is taking Pepcid 20mg  twice daily, and no longer needs daily Tums, just occasional. In the past has had flares of reflux that required 40mg  omeprazole for a few weeks to calm down.  Allergies:Allergies are doing very well on the Flonase and Allegra.  Osteopenia: She had DEXA (ordered by her GYN Dr. Valentino Saxon) in January, 2016.Results showed T-2.0 at fem neck (done at [facility]). She is due for f/u DEXA. She was previously told to stop taking calcium supplements due to family h/o heart disease.  Cataracts:   ROS:    PHYSICAL EXAM:   Well developed, pleasant female in no distress She is in good spirits, not irritable or hyperactive Neck: no lymphadenopathy, thyromegaly or mass, no carotid bruit Heart: regular rate and rhythm without murmur Lungs: clear bilaterally Back: no CVA tenderness Abdomen: soft, nontender, no organomegaly or mass Extremities: no edema, 2+ pulses Skin: no rash or lesions Neuro: alert and oriented. Normal strength, gait, cranial nerves Psych: normal mood, affect, hygiene and grooming      Chemistry      Component Value Date/Time   NA 138 07/05/2016 0724   K 4.1 07/05/2016 0724   CL 105 07/05/2016 0724   CO2 25 07/05/2016 0724   BUN 15 07/05/2016 0724   CREATININE 0.63 07/05/2016 0724      Component Value Date/Time   CALCIUM 8.7 07/05/2016 0724   ALKPHOS 42 07/05/2016 0724  AST 18 07/05/2016 0724   ALT 12 07/05/2016 0724   BILITOT 0.6 07/05/2016 0724     Fasting glucose 92  Lab Results  Component Value Date   CHOL 188 07/05/2016   HDL 91 07/05/2016   LDLCALC 76 07/05/2016   TRIG 107 07/05/2016   CHOLHDL 2.1 07/05/2016   Lab Results  Component Value Date   TSH 4.68 (H) 07/05/2016   Lab Results  Component Value Date   WBC 4.9 07/05/2016   HGB  11.9 07/05/2016   HCT 35.6 07/05/2016   MCV 88.3 07/05/2016   PLT 201 07/05/2016    ASSESSMENT/PLAN:    Forward labs to Dr. Harrington Challenger  Shingrix info  Consider mirapex vs clonezepam (in place of trazadone)

## 2016-07-09 ENCOUNTER — Encounter: Payer: Medicare Other | Admitting: Family Medicine

## 2016-07-10 NOTE — Progress Notes (Signed)
Chief Complaint  Patient presents with  . Med Check    med check, labs already done. Also thinks she may need that referral to GI for indigestion worsening. And would like to go over sleep study results and cards reports.   . Foreign Body in Skin    right thumb-cannot get out.   . Immunizations    update.    She got something in her right thumb while gardening.  She can't see it well enough to get it out--she tried last night. She would like it removed today.  Indigestion and heartburn is also flaring.  +stressors.  She is taking famotidine max strength BID, and still needs to use Tums multiple times/days. PPI's were effective in the past when she had flare of reflux.  She has some days with ongoing nausea. Previously given Dexilant samples, as well as 40mg  omeprazole (04/2015) which was effective. She is wondering if she needs to see GI.  She denies dysphagia, chest pain.  Needs Td and prevnar.  Had been given written rx to get both at pharmacy (she had declined getting prevnar in the office at her last visit), unable to get (her pharmacy doesn't give vaccines). Advised to get flu and prevnar here, and Td at pharmacy. Hasn't gotten any yet.  Hyperlipidemia: She was tried off simvastatin in the past (she was only on very low dose, tried without--still high, so then we tried treating mild hypothyroidism, but lipids didn't change). Went back on 10mg  of simvastatin (given family h/o CAD). Tolerating it fine without any side effects. Some reports some foot cramps at night.  No myalgias. She saw Dr. Dorris Carnes in October for consult, who sent her for Ca score CT, which showed Ca score of 0, no evidence of calcified plaque. Suggested cutting back to dose of 5mg  of simvastatin consider stopping. She didn't feel like there was good communication with her, and requesting review of results and recommendations today.   Hypothyroidism: She has been on 82mcg of branded Synthroid.  She has been taking it  on empty stomach separate from medications. She states that her hair loss had improved dramatically since being on the Synthroid, but recently started noticing more loss, and complaining of more fatigue. She has also noted some loss of pubic hair.  She reports her fingertips are very cold, and her skin is dry. Denies missed pills.  ADD:  Doing well on 30mg  of Adderall. Denies side effects. Recently refilled.  Insomnia--Trazadone helps some. Uses it nightly, and denies side effects. About 1/3rd of the time she finds that she needs to take a second pill of trazadone, if she doesn't fall asleep after about an hour or two with just 1 pill.  Worries a lot about her family, sometimes has a hard time turning her brain off. Thinking about talking to someone/counseling (about her son, mother). Anxiety has been worse, affecting her sleep.  She had sleep study in August, didn't get results and is upset about this. I thought she was given results (late, with apologies) the end of September, but she reports the  message was somewhat unclear and is frustrated/upset with lack of communication and answers.  A sleep study was performed in August, due to some reports of daytime somnolence (during car rides >1.5 hrs, family h/o OSA in her sister, and known snoring. It showed: IMPRESSIONS - No significant obstructive sleep apnea occurred during this study (AHI = 4.5/h). - No significant central sleep apnea occurred during this study (CAI =  0.6/h). - The patient had minimal or no oxygen desaturation during the study (Min O2 = 92.00%) - The patient snored with Moderate snoring volume. - No cardiac abnormalities were noted during this study. - Mild periodic limb movements of sleep occurred during the study. Associated arousals were significant. - Difficulty initiating and maintaining sleep- Insomnia. Patient used a "white noise machine".  DIAGNOSIS - Periodic Limb Movement Syndrome (327.51 [G47.61 ICD-10]) -  Primary Snoring (786.09 [R06.83 ICD-10]) - Insomnia  Her husband has RLS himself, takes medications and sleeps well at night--doesn't report/notice her snoring or leg movements.  Does recall that she "tosses and turns" at night.  Allergies:Allergies are controlled with Flonase and Allegra.  (chart reviewed--not addressed at visit today: Osteopenia: She had DEXA (ordered by her GYN Dr. Valentino Saxon) in January, 2016.Results showed T-2.0 at fem neck (done at [facility]). She is due for f/u DEXA. She was previously told to stop taking calcium supplements due to family h/o heart disease.)  PMH, PSH, SH reviewed Immunization History  Administered Date(s) Administered  . Influenza Split 04/23/2011  . Influenza, Seasonal, Injecte, Preservative Fre 06/09/2012  . Influenza,inj,Quad PF,36+ Mos 04/22/2014, 04/14/2015  . Tdap 12/26/2005  . Zoster 05/14/2011   Outpatient Encounter Prescriptions as of 07/11/2016  Medication Sig Note  . amphetamine-dextroamphetamine (ADDERALL XR) 30 MG 24 hr capsule Take 1 capsule (30 mg total) by mouth every morning.   Marland Kitchen aspirin 81 MG tablet Take 81 mg by mouth daily. 04/14/2015: .   . calcium carbonate (TUMS - DOSED IN MG ELEMENTAL CALCIUM) 500 MG chewable tablet Chew 1-2 tablets by mouth as needed for indigestion or heartburn. 12/26/2015: Uses prn, not daily  . cetirizine (ZYRTEC) 10 MG tablet Take 10 mg by mouth daily.   . famotidine (PEPCID) 20 MG tablet Take 20 mg by mouth 2 (two) times daily.  04/14/2015: Taking twice daily  . fluticasone (FLONASE) 50 MCG/ACT nasal spray USE 2 SPRAYS IN EACH NOSTRIL ONCE A DAY.   Marland Kitchen GLUCOSAMINE-CHONDROITIN PO Take by mouth as directed.   . Omega-3 Fatty Acids (FISH OIL) 1000 MG CAPS Take 1 capsule by mouth daily.   Marland Kitchen OVER THE COUNTER MEDICATION vagifilm   . simvastatin (ZOCOR) 10 MG tablet TAKE ONE TABLET AT BEDTIME.   . traZODone (DESYREL) 50 MG tablet TAKE 1 OR 2 TABLETS AT BEDTIME AS NEEDED FOR SLEEP.   Marland Kitchen TURMERIC PO Take by  mouth daily.   . [DISCONTINUED] fexofenadine (ALLEGRA) 180 MG tablet Take 180 mg by mouth daily.   . [DISCONTINUED] simvastatin (ZOCOR) 10 MG tablet TAKE ONE TABLET AT BEDTIME.   . [DISCONTINUED] SYNTHROID 25 MCG tablet TAKE 1 TABLET ONCE DAILY BEFORE BREAKFAST.   . clonazePAM (KLONOPIN) 0.5 MG tablet Take 1-2 tablets 30 minutes prior to bedtime for insomnia and restless legs   . Multiple Vitamin (MULTIVITAMIN) tablet Take 1 tablet by mouth daily.   Marland Kitchen omeprazole (PRILOSEC) 40 MG capsule Take 1 capsule (40 mg total) by mouth daily.   Marland Kitchen SYNTHROID 50 MCG tablet Take 1 tablet (50 mcg total) by mouth daily before breakfast.    No facility-administered encounter medications on file as of 07/11/2016.    Was on 35mcg of synthroid prior to today's appointment, and famotidine 20mg  BID, not omeprazole, prior to visit.  Allergies  Allergen Reactions  . Morphine And Related Other (See Comments)    Family history.  . Codeine Itching and Rash    ROS: +stress/irritability, insomnia, hair loss, feeling cold, heartburn, foreign body in thumb, sleepiness  with car rides of 1-1.5 hours or longer, insomnia.  See HPI. No chest pain, shortness of breath, fever, chills, URI symptoms, edema, bleeding, bruising, rash.    PHYSICAL EXAM:  BP 118/68 (BP Location: Left Arm, Patient Position: Sitting, Cuff Size: Normal)   Pulse 88   Ht 4' 10.75" (1.492 m)   Wt 106 lb (48.1 kg)   BMI 21.59 kg/m   Well developed, somewhat irritable/frustrated today, not her usual pleasant demeanor HEENT: conjunctiva and sclera are clear, OP is clear Neck: no lymphadenopathy, thyromegaly or mass, no carotid bruit Heart: regular rate and rhythm without murmur Lungs: clear bilaterally Back: no CVA tenderness Abdomen: soft, nontender, no organomegaly or mass. No epigastric tenderness Extremities: no edema, 2+ pulses Skin: no rash or lesions Neuro: alert and oriented. Normal strength, gait, cranial nerves Psych:  irritable/frustrated, somewhat flattened affect. Normal hygiene and grooming      Chemistry   Labs(Brief)          Component Value Date/Time   NA 138 07/05/2016 0724   K 4.1 07/05/2016 0724   CL 105 07/05/2016 0724   CO2 25 07/05/2016 0724   BUN 15 07/05/2016 0724   CREATININE 0.63 07/05/2016 0724     Labs(Brief)          Component Value Date/Time   CALCIUM 8.7 07/05/2016 0724   ALKPHOS 42 07/05/2016 0724   AST 18 07/05/2016 0724   ALT 12 07/05/2016 0724   BILITOT 0.6 07/05/2016 0724       Fasting glucose 92  RecentLabs       Lab Results  Component Value Date   CHOL 188 07/05/2016   HDL 91 07/05/2016   LDLCALC 76 07/05/2016   TRIG 107 07/05/2016   CHOLHDL 2.1 07/05/2016     RecentLabs       Lab Results  Component Value Date   TSH 4.68 (H) 07/05/2016     RecentLabs       Lab Results  Component Value Date   WBC 4.9 07/05/2016   HGB 11.9 07/05/2016   HCT 35.6 07/05/2016   MCV 88.3 07/05/2016   PLT 201 07/05/2016      ASSESSMENT/PLAN:  Hypothyroidism, unspecified type - TSH >4 on 3mcg, with symptoms and no missed pills. Increase to 67mcg and recheck TSH in 6 weeks - Plan: SYNTHROID 50 MCG tablet, TSH  Attention deficit disorder, unspecified hyperactivity presence - controlled with 30g adderall  Pure hypercholesterolemia - well controlled with low dose siimvastatin; Dr. Harrington Challenger suggested trial of decreasing dose. Many med changes today and no SE's, so will continue for now - Plan: simvastatin (ZOCOR) 10 MG tablet  PLMD (periodic limb movement disorder) - sleep study results reviewed in detail; given anxiety and worsening insomnia, trial of clonazepam qHS. Stop trazadone - Plan: clonazePAM (KLONOPIN) 0.5 MG tablet  Insomnia, unspecified type  Pure hypercholesterolemia - continue statin - Plan: simvastatin (ZOCOR) 10 MG tablet  Gastroesophageal reflux disease, esophagitis presence not specified - stop famotidine  and change to 40mg  omeprazole.  reflux precautions reviewed. GI referral not needed at this point--red flags reviewed - Plan: omeprazole (PRILOSEC) 40 MG capsule  Immunization due - high dose flu shot and prevnar-13 today. new written rx for Td to get at pharmacy (one that gives vaccines) - Plan: Flu vaccine HIGH DOSE PF (Fluzone High dose), Pneumococcal conjugate vaccine 13-valent  Foreign body in skin of finger, initial encounter - area was cleansed with alcohol; foreign body removed with forceps, tolerated well - Plan: PR  REMOVE FOREIGN BODY SIMPLE  Flu and [id number] today Td at pharmacy.  6 week f/u with TSH prior  Shingrix info given/discussed, for when available  clonezepam (in place of trazadone)-to help with RLS, insomnia (and anxiety). Encouraged counseling--given Almyra Free Whitt's name, also mentioned Vanetta Mulders.   Over an hour was spent with this patient, more than 1/2 spent counseling, plus FB removal.    Start clonazepam 0.5mg ; take it 30 minutes prior to going to sleep. Don't use the trazadone along with it--it likely should help you sleep so you won't need it.  If 0.5mg  doesn't help you sleep, you may double up and take 2 (dose of 1mg  every night).    Let's see if controlling the restless the legs decreases your sleep arousals and interrupted sleep, and improves your quality of sleep and energy during the day.  We are increasing your Synthroid to 71mcg. Continue to take it on empty stomach, separate from food and other vitamins/medications. Return in 6 weeks for another blood test to ensure that this dose is correct. We will see you after the test to go over the results and see how the new medications clonazepam is working for you. Please contact us sooner if you are having any problems.  Reflux--stop the famotidine. Restart omeprazole 40mg .  I recommend starting it just once daily, before breakfast. If needed, you can take it twice daily (before breakfast and dinner) if  having nighttime symptoms when only taking it in the morning.  Use this regularly for at least a month.  I am putting additional refills.  If you feel like you are back to baseline and want to switch back to famotidine after 1-3 months, that is fine. If you develop problems with swallowing or chest pain when eating, we can send you to a GI doctor.

## 2016-07-11 ENCOUNTER — Ambulatory Visit (INDEPENDENT_AMBULATORY_CARE_PROVIDER_SITE_OTHER): Payer: Medicare Other | Admitting: Family Medicine

## 2016-07-11 ENCOUNTER — Encounter: Payer: Self-pay | Admitting: Family Medicine

## 2016-07-11 VITALS — BP 118/68 | HR 88 | Ht 58.75 in | Wt 106.0 lb

## 2016-07-11 DIAGNOSIS — Z23 Encounter for immunization: Secondary | ICD-10-CM | POA: Diagnosis not present

## 2016-07-11 DIAGNOSIS — E7800 Pure hypercholesterolemia, unspecified: Secondary | ICD-10-CM | POA: Diagnosis not present

## 2016-07-11 DIAGNOSIS — G4761 Periodic limb movement disorder: Secondary | ICD-10-CM

## 2016-07-11 DIAGNOSIS — F988 Other specified behavioral and emotional disorders with onset usually occurring in childhood and adolescence: Secondary | ICD-10-CM | POA: Diagnosis not present

## 2016-07-11 DIAGNOSIS — S60459A Superficial foreign body of unspecified finger, initial encounter: Secondary | ICD-10-CM

## 2016-07-11 DIAGNOSIS — E039 Hypothyroidism, unspecified: Secondary | ICD-10-CM | POA: Diagnosis not present

## 2016-07-11 DIAGNOSIS — G4700 Insomnia, unspecified: Secondary | ICD-10-CM

## 2016-07-11 DIAGNOSIS — K219 Gastro-esophageal reflux disease without esophagitis: Secondary | ICD-10-CM | POA: Diagnosis not present

## 2016-07-11 MED ORDER — CLONAZEPAM 0.5 MG PO TABS: ORAL_TABLET | ORAL | 1 refills | Status: DC

## 2016-07-11 MED ORDER — SIMVASTATIN 10 MG PO TABS: ORAL_TABLET | ORAL | 1 refills | Status: DC

## 2016-07-11 MED ORDER — OMEPRAZOLE 40 MG PO CPDR: 40.0000 mg | DELAYED_RELEASE_CAPSULE | Freq: Every day | ORAL | 3 refills | Status: DC

## 2016-07-11 MED ORDER — SYNTHROID 50 MCG PO TABS: 50.0000 ug | ORAL_TABLET | Freq: Every day | ORAL | 1 refills | Status: DC

## 2016-07-11 NOTE — Patient Instructions (Addendum)
Start clonazepam 0.5mg ; take it 30 minutes prior to going to sleep. Don't use the trazadone along with it--it likely should help you sleep so you won't need it.  If 0.5mg  doesn't help you sleep, you may double up and take 2 (dose of 1mg  every night).    Let's see if controlling the restless the legs decreases your sleep arousals and interrupted sleep, and improves your quality of sleep and energy during the day.  We are increasing your Synthroid to 33mcg. Continue to take it on empty stomach, separate from food and other vitamins/medications. Return in 6 weeks for another blood test to ensure that this dose is correct. We will see you after the test to go over the results and see how the new medications clonazepam is working for you. Please contact us sooner if you are having any problems.  MPRESSIONS - No significant obstructive sleep apnea occurred during this study (AHI = 4.5/h). - No significant central sleep apnea occurred during this study (CAI = 0.6/h). - The patient had minimal or no oxygen desaturation during the study (Min O2 = 92.00%) - The patient snored with Moderate snoring volume. - No cardiac abnormalities were noted during this study. - Mild periodic limb movements of sleep occurred during the study. Associated arousals were significant. - Difficulty initiating and maintaining sleep- Insomnia. Patient used a "white noise machine".  DIAGNOSIS - Periodic Limb Movement Syndrome (327.51 [G47.61 ICD-10]) - Primary Snoring (786.09 [R06.83 ICD-10]) - Insomnia   Reflux--stop the famotidine. Restart omeprazole 40mg .  I recommend starting it just once daily, before breakfast. If needed, you can take it twice daily (before breakfast and dinner) if having nighttime symptoms when only taking it in the morning.  Use this regularly for at least a month.  I am putting additional refills.  If you feel like you are back to baseline and want to switch back to famotidine after 1-3 months,  that is fine. If you develop problems with swallowing or chest pain when eating, we can send you to a GI doctor.   Food Choices for Gastroesophageal Reflux Disease, Adult When you have gastroesophageal reflux disease (GERD), the foods you eat and your eating habits are very important. Choosing the right foods can help ease your discomfort. What guidelines do I need to follow?  Choose fruits, vegetables, whole grains, and low-fat dairy products.  Choose low-fat meat, fish, and poultry.  Limit fats such as oils, salad dressings, butter, nuts, and avocado.  Keep a food diary. This helps you identify foods that cause symptoms.  Avoid foods that cause symptoms. These may be different for everyone.  Eat small meals often instead of 3 large meals a day.  Eat your meals slowly, in a place where you are relaxed.  Limit fried foods.  Cook foods using methods other than frying.  Avoid drinking alcohol.  Avoid drinking large amounts of liquids with your meals.  Avoid bending over or lying down until 2-3 hours after eating. What foods are not recommended? These are some foods and drinks that may make your symptoms worse: Vegetables  Tomatoes. Tomato juice. Tomato and spaghetti sauce. Chili peppers. Onion and garlic. Horseradish. Fruits  Oranges, grapefruit, and lemon (fruit and juice). Meats  High-fat meats, fish, and poultry. This includes hot dogs, ribs, ham, sausage, salami, and bacon. Dairy  Whole milk and chocolate milk. Sour cream. Cream. Butter. Ice cream. Cream cheese. Drinks  Coffee and tea. Bubbly (carbonated) drinks or energy drinks. Condiments  Hot sauce. Barbecue  sauce. Sweets/Desserts  Chocolate and cocoa. Donuts. Peppermint and spearmint. Fats and Oils  High-fat foods. This includes [place] fries and potato chips. Other  Vinegar. Strong spices. This includes black pepper, white pepper, red pepper, cayenne, curry powder, cloves, ginger, and chili powder. The items  listed above may not be a complete list of foods and drinks to avoid. Contact your dietitian for more information.  This information is not intended to replace advice given to you by your health care provider. Make sure you discuss any questions you have with your health care provider. Document Released: 12/18/2011 Document Revised: 11/24/2015 Document Reviewed: 04/22/2013 Elsevier Interactive Patient Education  2017 [employer].  I recommend getting the new shingles vaccine (Shingrix) when available. You will need to check with your insurance to see if it is covered, and if covered by Medicare Part D, you need to get from the pharmacy rather than our office.  It is a series of 2 injections, spaced 2 months apart.  You were given flu shot and prevnar-13 (pneumonia vaccine, one of them) today. Please get the tetanus shot (Td) at the pharmacy (check other pharmacies if yours doesn't give vaccines).  Marya Amsler or others at [facility] (on [address], across from [employer]) is recommended for counseling.

## 2016-07-25 ENCOUNTER — Telehealth: Payer: Self-pay | Admitting: Family Medicine

## 2016-07-25 NOTE — Telephone Encounter (Signed)
Returned call to pt from voice mail.  Reached her voice mail [email or web address].

## 2016-07-31 ENCOUNTER — Ambulatory Visit (INDEPENDENT_AMBULATORY_CARE_PROVIDER_SITE_OTHER): Payer: 59 | Admitting: Licensed Clinical Social Worker

## 2016-07-31 DIAGNOSIS — F331 Major depressive disorder, recurrent, moderate: Secondary | ICD-10-CM | POA: Diagnosis not present

## 2016-08-01 LAB — HM MAMMOGRAPHY

## 2016-08-01 LAB — HM DEXA SCAN

## 2016-08-15 ENCOUNTER — Encounter: Payer: Self-pay | Admitting: *Deleted

## 2016-08-15 ENCOUNTER — Encounter: Payer: Self-pay | Admitting: Family Medicine

## 2016-08-21 ENCOUNTER — Ambulatory Visit (INDEPENDENT_AMBULATORY_CARE_PROVIDER_SITE_OTHER): Payer: 59 | Admitting: Licensed Clinical Social Worker

## 2016-08-21 ENCOUNTER — Telehealth: Payer: Self-pay

## 2016-08-21 DIAGNOSIS — F331 Major depressive disorder, recurrent, moderate: Secondary | ICD-10-CM | POA: Diagnosis not present

## 2016-08-21 NOTE — Telephone Encounter (Signed)
Pt states that her therapist advised her to contact you in regards to clonazepam causing her to be groggy, and sleeping during the day. c/o headaches and depression still. Decrease energy. She would like to know what the next steps are? Therapist reports that prozac may need to be added, and the clonazepam cut back.  She is currently taking 1.5 tablets of the clonazepam and it is making her groggy. Headaches are coming more frequently.  CB # [id number].

## 2016-08-21 NOTE — Telephone Encounter (Signed)
She needs OV.  She was supposed to be taking clonazepam only at night, if too groggy on it, then should decrease the dose.  She is clearly having more issues than just insomnia related to anxiety, so needs 30 min OV scheduled to discuss these concerns. Cut back to only 1 tab of clonazepam at bedtime, or stop it entirely and go back to trazadone until her visit.

## 2016-08-21 NOTE — Telephone Encounter (Signed)
Spoke with [name]- offered appts for 02/21 and 02/22, pt states she is not able to make those appts due to other arrangements. Advised pt of medication recommendations. She verbalized understanding. Pt was rescheduled for 08/27/2016. She will come 08/22/2016 for labs. Nicole Allen

## 2016-08-22 ENCOUNTER — Other Ambulatory Visit: Payer: Medicare Other

## 2016-08-22 DIAGNOSIS — E039 Hypothyroidism, unspecified: Secondary | ICD-10-CM

## 2016-08-22 LAB — TSH: TSH: 1.86 mIU/L
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
## 2016-08-27 ENCOUNTER — Ambulatory Visit (INDEPENDENT_AMBULATORY_CARE_PROVIDER_SITE_OTHER): Payer: Medicare Other | Admitting: Family Medicine

## 2016-08-27 VITALS — BP 128/70 | HR 68 | Ht 59.0 in | Wt 114.2 lb

## 2016-08-27 DIAGNOSIS — G4761 Periodic limb movement disorder: Secondary | ICD-10-CM | POA: Diagnosis not present

## 2016-08-27 DIAGNOSIS — E039 Hypothyroidism, unspecified: Secondary | ICD-10-CM

## 2016-08-27 DIAGNOSIS — J340 Abscess, furuncle and carbuncle of nose: Secondary | ICD-10-CM | POA: Diagnosis not present

## 2016-08-27 DIAGNOSIS — R600 Localized edema: Secondary | ICD-10-CM

## 2016-08-27 DIAGNOSIS — G4700 Insomnia, unspecified: Secondary | ICD-10-CM | POA: Diagnosis not present

## 2016-08-27 DIAGNOSIS — F411 Generalized anxiety disorder: Secondary | ICD-10-CM

## 2016-08-27 DIAGNOSIS — K219 Gastro-esophageal reflux disease without esophagitis: Secondary | ICD-10-CM

## 2016-08-27 DIAGNOSIS — R635 Abnormal weight gain: Secondary | ICD-10-CM

## 2016-08-27 MED ORDER — SYNTHROID 50 MCG PO TABS: 50.0000 ug | ORAL_TABLET | Freq: Every day | ORAL | 5 refills | Status: DC

## 2016-08-27 MED ORDER — MUPIROCIN CALCIUM 2 % NA OINT: TOPICAL_OINTMENT | NASAL | 0 refills | Status: DC

## 2016-08-27 MED ORDER — FUROSEMIDE 20 MG PO TABS: ORAL_TABLET | ORAL | 0 refills | Status: DC

## 2016-08-27 MED ORDER — FLUOXETINE HCL 20 MG PO TABS: ORAL_TABLET | ORAL | 1 refills | Status: DC

## 2016-08-27 MED ORDER — ZOLPIDEM TARTRATE 10 MG PO TABS: 5.0000 mg | ORAL_TABLET | Freq: Every evening | ORAL | 1 refills | Status: DC | PRN

## 2016-08-27 NOTE — Patient Instructions (Signed)
  Start prozac at 1/2 tablet (10mg  dose) once daily in the morning. Increase to full tablet after a week, or longer if it takes longer for side effects to lessen.    Start the [place] at bedtime--try just 1/2 tablet first.  If ineffective, increase to the full tablet.  Stop taking if you are having any unusual behaviors noted. As we discussed, we would like change the ambien if needed longterm (either to a different medication such as belsomra, or to the [place] CR which is approved for longterm use, but not necessarily recommended for people over 65).    We can try using furosemide (lasix) to help get off some of the fluid that you are retaining in your hands and feet.  It is very important to watch your sodium intake, as this contributes to fluid retention. It can cause you to lose excess potassium (in your urine). Take a banana daily while taking the fluid pill.  It is important that you try and stay well hydrated, especially when taking a fluid pill.  Especially to get yourself very well hydrated prior to going to [place]. Start at 1/2 tablet in the morning.  If no effect, you can take a full tablet the next day. You can judge whether you need to continue it by the way your shoes/rings fit.  Daily weights will also help.  You shouldn't lose more than 2-3# in a day, if so, you need to stop taking it.

## 2016-08-28 ENCOUNTER — Other Ambulatory Visit: Payer: Medicare Other

## 2016-08-28 ENCOUNTER — Telehealth: Payer: Self-pay | Admitting: Family Medicine

## 2016-08-28 ENCOUNTER — Encounter: Payer: Self-pay | Admitting: Family Medicine

## 2016-08-28 NOTE — Telephone Encounter (Signed)
Left message for pt to call me back 

## 2016-08-28 NOTE — Telephone Encounter (Signed)
PT states that she only took 1/2 tablet last night. Advised pt of [name] recommendations to increase to whole tablet. She will try this and call us tomorrow if no relief. Victorino December

## 2016-08-28 NOTE — Telephone Encounter (Signed)
She had been advised to start at 1/2 tablet, then increase to full.  If she only took 1/2, then she should try the full tonight.  If that didn't help, she can try taking the trazadone along with it (50mg ).  She can also try taking melatonin along with the ambien, if needed, and definitely work on stress reduction techniques such as deep breathing exercises and meditation in the evenings to decrease anxiety and relax some.

## 2016-08-28 NOTE — Telephone Encounter (Signed)
Pt called and states the ambien you gave her last night did not work.  She was only able to sleep 1 hour.  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  Please advise [phone]

## 2016-08-30 ENCOUNTER — Other Ambulatory Visit: Payer: Self-pay | Admitting: Family Medicine

## 2016-08-30 ENCOUNTER — Encounter: Payer: Medicare Other | Admitting: Family Medicine

## 2016-08-30 DIAGNOSIS — E039 Hypothyroidism, unspecified: Secondary | ICD-10-CM

## 2016-09-17 ENCOUNTER — Ambulatory Visit (INDEPENDENT_AMBULATORY_CARE_PROVIDER_SITE_OTHER): Payer: 59 | Admitting: Licensed Clinical Social Worker

## 2016-09-17 DIAGNOSIS — F3341 Major depressive disorder, recurrent, in partial remission: Secondary | ICD-10-CM | POA: Diagnosis not present

## 2016-10-06 NOTE — Progress Notes (Signed)
Chief Complaint  Patient presents with  . Follow-up    6 week follow up.    Patient presents for 6 week follow-up, after starting fluoxetine.  She had many complaints at her last visit, including anxiety, depressive symptoms, insomnia, daytime fatigue (related to meds used for insomnia, specifically clonazepam to treat anxiety/insomnia/PLMD).  She was started on fluoxetine, as well as given rx for ambien for sleep.  When asked if she has had any benefit from the fluoxetine, she states "I'm not doing anything, can't tell if it helps." Stressors discussed at her last visit (related to son, brother-in-law) are better, now just her energy and health are what is concerning her and causing stress.  She had the opposite reaction to the [place], didn't help her sleep at all. The fogginess she had at her last visit lifted, but still feels tired.  Went back to trazadone, 1-2 tablets, back to how it used to help.  Helps some, not completely.  She took the lasix just prior to leaving for ski trip, was able to get her ski boots on. She had significant fatigue and nausea while on her ski trip, as well as trouble sleeping.  Slept in, was able to ski for 1/2 day.  Nausea started while on ski trip, but has persisted, intermittently. Still wakes up nauseated, sporadically--ruins the whole day. Then has no energy or stamina the whole day.  He is not leaving house to drive anywhere, not gardening, which is very unusual for her (had her husband very concerned, brought in a list of concerns of his).  She just has no energy to do anything, especially on the days she has nausea.  Has decreased appetite. Not napping during the day, just lying down a lot. She goes to bed late (because has trouble getting to sleep, even though exhausted) and then is sleeping late (10-11), which is very unusual for her.  Her sleep is very restless, tossing and turning.  (known PLMD found on sleep study).  She is also complaining of some  hand tremors, dry mouth and out of breath easily with exertion.  She notices tremor when flossing teeth, putting in contacts.  She reports having had a cardiac evaluation in the past.  Had Ca score of 0 within the year, and previously had seen Dr. Recardo Evangelist, had echo in 08/2011 (she "hated" him).  Had a few good days intermittently, with good energy (Wed through [place] last week). She cannot determine what contributes to her bad days vs good days.  Denies heartburn or indigestion since being on PPI. Just waking up nauseated, not daily, not today. Some food aversions--couldn't eat the omelet husband made her yesterday, but was able to eat eggs a few days prior. Feels like her taste is off, things taste tart to her.  Can't push through the fatigue, lack of energy and motivation. She is very frustrated.  2 weeks ago had a cold, resolved.  Denies any significant ongoing symptoms or allergies. Takes zyrtec daily, for a couple of years. Doesn't think it contributes to fatigue at all, as she has been taking it for years. Can't use flonase since nose "erupted"--used to use it daily.  Doesn't notice significant increase in allergies/congestion since stopping it. She used the [place] while skiing, lesion on nose resolved, but it came back.  Using it again, but hasn't gone away yet.  Denies any spread/swelling/fever.  Previously saw dermatologist for this (which had spread to cheek and required oral ABX).  She is seeing  Marya Amsler for counseling. (she saw her once since our last visit, and is scheduled for tomorrow)  She had been complaining of hand/feet swelling, as well as weight gain prior to her last visit.  She was given rx for furosemide to use prn.  She used it prior to leaving for ski trip, hasn't needed since.  Denies any edema.  She has lost the weight she had gained.   Hypothyroidism:   Lab Results  Component Value Date   TSH 1.86 08/22/2016   Dose doubled after TSH elevated in January.  Recheck was normal in February.  She reports feeling somewhat tremulous/tremor at last visit, but worse now.  Denies hair/skin/bowel changes.  h/o vitamin D deficiency "a decade ago". Last check 10/2013 level was 64.  Stopped all supplements 4-5 years ago Hasn't been taking any supplements.  ADHD:  Requesting refill today. Took adderall today, but hasn't really been taking it as regularly, just 2x/week. Used to take it daily. Has taken it some days and still just laid around, doesn't attribute to being more tired on days she doesn't take it then when she does.  Can't take when nauseated, or when she wakes up 11am, and has to take synthroid first, doesn't want it to keep her up, which is why she has skipped many days.  Osteopenia:  She had DEXA done by her GYN in 07/2016. T-2.1 L fem neck.  Fosamax was recommended, but she and her GYN discussed waiting at least 3 months before starting anything.  PMH, PSH, SH reviewed  Outpatient Encounter Prescriptions as of 10/08/2016  Medication Sig Note  . amphetamine-dextroamphetamine (ADDERALL XR) 30 MG 24 hr capsule Take 1 capsule (30 mg total) by mouth every morning.   Marland Kitchen aspirin 81 MG tablet Take 81 mg by mouth daily. 04/14/2015: .   . cetirizine (ZYRTEC) 10 MG tablet Take 10 mg by mouth daily.   Marland Kitchen FLUoxetine (PROZAC) 20 MG tablet Start at 1/2 tablet by mouth every morning.  Increase to full tablet after 1 week, if tolerating   . GLUCOSAMINE-CHONDROITIN PO Take by mouth as directed.   . mupirocin nasal ointment (BACTROBAN NASAL) 2 % Apply to the affected area of skin at left nostril twice daily for 5-7 days.   . Omega-3 Fatty Acids (FISH OIL) 1000 MG CAPS Take 1 capsule by mouth daily.   Marland Kitchen omeprazole (PRILOSEC) 40 MG capsule Take 1 capsule (40 mg total) by mouth daily.   Marland Kitchen OVER THE COUNTER MEDICATION vagifilm   . simvastatin (ZOCOR) 10 MG tablet TAKE ONE TABLET AT BEDTIME.   . SYNTHROID 50 MCG tablet TAKE 1 TABLET ONCE DAILY BEFORE BREAKFAST.   Marland Kitchen  traZODone (DESYREL) 50 MG tablet TAKE 1 OR 2 TABLETS AT BEDTIME AS NEEDED FOR SLEEP.   Marland Kitchen TURMERIC PO Take by mouth daily.   . [DISCONTINUED] amphetamine-dextroamphetamine (ADDERALL XR) 30 MG 24 hr capsule Take 1 capsule (30 mg total) by mouth every morning. 10/08/2016: Taking only about 2x/week  . calcium carbonate (TUMS - DOSED IN MG ELEMENTAL CALCIUM) 500 MG chewable tablet Chew 1-2 tablets by mouth as needed for indigestion or heartburn. 12/26/2015: Uses prn, not daily  . fluticasone (FLONASE) 50 MCG/ACT nasal spray USE 2 SPRAYS IN EACH NOSTRIL ONCE A DAY. (Patient not taking: Reported on 10/08/2016) 10/08/2016: Stopped when sore in nose flared up  . furosemide (LASIX) 20 MG tablet Take 1/2 - 1 tablet by mouth in the morning, if needed for fluid retention (Patient not taking: Reported on  10/08/2016)   . ondansetron (ZOFRAN ODT) 4 MG disintegrating tablet Take 1 tablet (4 mg total) by mouth every 8 (eight) hours as needed for nausea or vomiting.   Marland Kitchen zolpidem (AMBIEN) 10 MG tablet Take 0.5-1 tablets (5-10 mg total) by mouth at bedtime as needed for sleep. (Patient not taking: Reported on 10/08/2016)   . [DISCONTINUED] clonazePAM (KLONOPIN) 0.5 MG tablet Take 1-2 tablets 30 minutes prior to bedtime for insomnia and restless legs (Patient not taking: Reported on 08/27/2016)    No facility-administered encounter medications on file as of 10/08/2016.    Allergies  Allergen Reactions  . Morphine And Related Other (See Comments)    Family history.  . Codeine Itching and Rash   ROS: no fever, chills. Allergies stable overall.  URI symptoms resolved.  No headaches, dizziness, just overwhelming fatigue.  +mild tremors/tremulousness.  +nausea.  No hearburn, chest pain, bowel changes, urinary complaints, joint pains, edema.  +insomnia. See HPI for details. Recurrent/persistent rash at left nares.  PHYSICAL EXAM:  BP 110/70 (BP Location: Left Arm, Patient Position: Sitting, Cuff Size: Normal)   Pulse 68   Ht 4\' 11"   (1.499 m)   Wt 106 lb 12.8 oz (48.4 kg)   BMI 21.57 kg/m   Wt Readings from Last 3 Encounters:  10/08/16 106 lb 12.8 oz (48.4 kg)  08/27/16 114 lb 3.2 oz (51.8 kg)  07/11/16 106 lb (48.1 kg)   Thin female, with somewhat flat affect, mostly very frustrated, irritable.  Repeated herself many times, seeming very frustrated with her complete lack of energy.   HEENT: conjunctiva and sclera are clear, EOMI, OP clear.  Very minimal erythema at left nares--no sore, pustule or blister noted. No surrounding erythema or swelling Neck: no lymphadenopathy, thyromegaly or bruit Heart: regular rate and rhythm, no murmur Lungs: clear bilaterally Abdomen: soft, nontender, no epigastric tenderness, no masses Extremities: no edema Neuro: alert and oriented.  Cranial nerves intact.  Minimal very fine tremor noted when holding hands straight out, symmetric. Normal gait. Psych: flat affect, irritable/frustrated.  No crying.  Intermittently did smile, rare.  Normal hygiene and grooming.  ASSESSMENT/PLAN:  Other fatigue - Plan: TSH, VITAMIN D 25 Hydroxy (Vit-D Deficiency, Fractures), Ferritin, CBC with Differential/Platelet  Hypothyroidism, unspecified type - recheck TSH given worsening fatigue and increasing tremor, ensure not too high of dose - Plan: TSH  Restless leg syndrome - did not tolerate clonazepam; contributing to poor sleep and fatigue. Once nausea improved, rec trying mirapex. Will call when ready for rx - Plan: Ferritin  Vitamin D deficiency - given severe fatigue, rec rechecking since not taking supplements - Plan: VITAMIN D 25 Hydroxy (Vit-D Deficiency, Fractures)  Nausea - poss side effect from prozac, vs related to reflux. Stop Prozac; change PPI to prior to dinner, may need trial of BID. zofran prn  - Plan: ondansetron (ZOFRAN ODT) 4 MG disintegrating tablet  Attention deficit disorder, unspecified hyperactivity presence - refilled med - Plan: amphetamine-dextroamphetamine (ADDERALL XR) 30  MG 24 hr capsule  Generalized anxiety disorder - no benefit from prozac, D/C. continue counseling with Almyra Free. Hold off on other meds at this time. Depressive sx related to clonazepam, now related to fatigue  Osteopenia, unspecified location - given nausea & reflux, agree with holding off on fosamax currently. Consider monthly vs infusion rather than weekly, given her synthroid use. not prolia cand.  Chronic allergic rhinitis, unspecified seasonality, unspecified trigger - continue antihistamine (zyrtec, declines changing to non-sedating); retart flonase when able. f/u with derm for  nose if not resolving   Allergies--seems to be well controlled even without use flonase, on zyrttec alone.  Denies that zyrtec makes sleepy. Continue  f/u with derm re: nose if not healing from bactroban. No signfiicant evidence of cellulitis at this time, hold off on oral ABX.  zofran ODT to use prn for nausea.  Suspect nausea may be related to prozac and/or reflux (PPI wearing off by morning).   Fatigue is multi-factorial--poor sleep (trouble getting to sleep, plus PLMD interfering with her sleep), nausea.  Continue trazadone for now, and plan to start mirapex soon, once nausea improved some.  Discussed Belsomra--may be a better option for her since trazadone isn't all that efective, clonazepam made her far too drowsy, and ambien had an adverse response.  45-55 min visit, more than 1/2 spent counseling  TSH, Vit D, CBC  Stop prozac. Change your omeprazole dosing time from morning to before dinner.  If you develop indigestion or heartburn during the day, try taking it twice daily (before breakfast and before dinner) for a week or two and let us know how that works.. If you have ongoing problems with nausea, despite these measures, and as the prozac gets out of your system, then we can send you to GI.  In the meantime, use the ondansetron as needed for severe nausea. Also try raising the head of the bed as we  discussed.  Options we discussed to treat restless leg syndrome include Mirapex, gabapentin.  I prefer the Mirapex,since gabapentin can cause sedation. Let me know in 2-3 weeks if your nausea is improved and you are ready to try the mirapex.  Feel free to look this medication up.  We briefly mentioned Belsomra today, to consider starting for insomnia, once the restless leg is addressed, if you're having ongoing sleep issues.  For now, you can continue the trazadone.    We should consider sending you to a psychiatrist--can help with anxiety, sleep, ADHD.   Dr. Toy Care is a suggestion.

## 2016-10-08 ENCOUNTER — Ambulatory Visit (INDEPENDENT_AMBULATORY_CARE_PROVIDER_SITE_OTHER): Payer: Medicare Other | Admitting: Family Medicine

## 2016-10-08 ENCOUNTER — Encounter: Payer: Self-pay | Admitting: Family Medicine

## 2016-10-08 VITALS — BP 110/70 | HR 68 | Ht 59.0 in | Wt 106.8 lb

## 2016-10-08 DIAGNOSIS — R5383 Other fatigue: Secondary | ICD-10-CM

## 2016-10-08 DIAGNOSIS — E559 Vitamin D deficiency, unspecified: Secondary | ICD-10-CM

## 2016-10-08 DIAGNOSIS — F988 Other specified behavioral and emotional disorders with onset usually occurring in childhood and adolescence: Secondary | ICD-10-CM | POA: Diagnosis not present

## 2016-10-08 DIAGNOSIS — F411 Generalized anxiety disorder: Secondary | ICD-10-CM

## 2016-10-08 DIAGNOSIS — M8580 Other specified disorders of bone density and structure, unspecified site: Secondary | ICD-10-CM

## 2016-10-08 DIAGNOSIS — E039 Hypothyroidism, unspecified: Secondary | ICD-10-CM

## 2016-10-08 DIAGNOSIS — R110 Nausea: Secondary | ICD-10-CM

## 2016-10-08 DIAGNOSIS — G2581 Restless legs syndrome: Secondary | ICD-10-CM

## 2016-10-08 DIAGNOSIS — J309 Allergic rhinitis, unspecified: Secondary | ICD-10-CM

## 2016-10-08 LAB — CBC WITH DIFFERENTIAL/PLATELET
BASOS ABS: 57 {cells}/uL (ref 0–200)
Basophils Relative: 1 %
EOS ABS: 114 {cells}/uL (ref 15–500)
Eosinophils Relative: 2 %
HCT: 40 % (ref 35.0–45.0)
HEMOGLOBIN: 13.4 g/dL (ref 11.7–15.5)
LYMPHS ABS: 1539 {cells}/uL (ref 850–3900)
Lymphocytes Relative: 27 %
MCH: 28.6 pg (ref 27.0–33.0)
MCHC: 33.5 g/dL (ref 32.0–36.0)
MCV: 85.3 fL (ref 80.0–100.0)
MONO ABS: 342 {cells}/uL (ref 200–950)
MPV: 8.8 fL (ref 7.5–12.5)
Monocytes Relative: 6 %
NEUTROS PCT: 64 %
Neutro Abs: 3648 cells/uL (ref 1500–7800)
Platelets: 209 10*3/uL (ref 140–400)
RBC: 4.69 MIL/uL (ref 3.80–5.10)
RDW: 13.6 % (ref 11.0–15.0)
WBC: 5.7 10*3/uL (ref 4.0–10.5)

## 2016-10-08 LAB — TSH: TSH: 0.62 m[IU]/L

## 2016-10-08 MED ORDER — ONDANSETRON 4 MG PO TBDP: 4.0000 mg | ORAL_TABLET | Freq: Three times a day (TID) | ORAL | 0 refills | Status: DC | PRN

## 2016-10-08 MED ORDER — AMPHETAMINE-DEXTROAMPHET ER 30 MG PO CP24: 30.0000 mg | ORAL_CAPSULE | ORAL | 0 refills | Status: DC

## 2016-10-08 NOTE — Patient Instructions (Signed)
  Stop prozac. Change your omeprazole dosing time from morning to before dinner.  If you develop indigestion or heartburn during the day, try taking it twice daily (before breakfast and before dinner) for a week or two and let us know how that works.. If you have ongoing problems with nausea, despite these measures, and as the prozac gets out of your system, then we can send you to GI.  In the meantime, use the ondansetron as needed for severe nausea. Also try raising the head of the bed as we discussed.  Options we discussed to treat restless leg syndrome include Mirapex, gabapentin.  I prefer the Mirapex,since gabapentin can cause sedation. Let me know in 2-3 weeks if your nausea is improved and you are ready to try the mirapex.  Feel free to look this medication up.  We briefly mentioned Belsomra today, to consider starting for insomnia, once the restless leg is addressed, if you're having ongoing sleep issues.  For now, you can continue the trazadone.    We should consider sending you to a psychiatrist--can help with anxiety, sleep, ADHD.   Dr. Toy Care is a suggestion.

## 2016-10-09 ENCOUNTER — Encounter: Payer: Self-pay | Admitting: Family Medicine

## 2016-10-09 ENCOUNTER — Ambulatory Visit (INDEPENDENT_AMBULATORY_CARE_PROVIDER_SITE_OTHER): Payer: 59 | Admitting: Licensed Clinical Social Worker

## 2016-10-09 DIAGNOSIS — F3341 Major depressive disorder, recurrent, in partial remission: Secondary | ICD-10-CM | POA: Diagnosis not present

## 2016-10-09 LAB — VITAMIN D 25 HYDROXY (VIT D DEFICIENCY, FRACTURES): VIT D 25 HYDROXY: 36 ng/mL (ref 30–100)

## 2016-10-09 LAB — FERRITIN: Ferritin: 94 ng/mL (ref 20–288)

## 2016-11-07 ENCOUNTER — Other Ambulatory Visit: Payer: Self-pay | Admitting: Family Medicine

## 2016-11-07 DIAGNOSIS — E039 Hypothyroidism, unspecified: Secondary | ICD-10-CM

## 2016-11-07 DIAGNOSIS — K219 Gastro-esophageal reflux disease without esophagitis: Secondary | ICD-10-CM

## 2016-11-07 NOTE — Telephone Encounter (Signed)
Please call her to see how she is doing, I hadn't heard form her with any updates (I had reached out on MyChart asking her to let me know how she was doing).  We talked about a lot of things at last visit, including mirapex (for restless legs), Belsomra as the trazadone wasn't working all that well.  If she is happy with the trazadone, okay to refill x 3 months.  She will be due for wellness visit in June, has no follow-up scheduled.

## 2016-11-07 NOTE — Telephone Encounter (Signed)
Voicemail box full-will try again.

## 2016-11-07 NOTE — Telephone Encounter (Signed)
Is it okay to refill trazodone?

## 2016-11-08 ENCOUNTER — Encounter: Payer: Self-pay | Admitting: *Deleted

## 2016-11-08 NOTE — Telephone Encounter (Signed)
Mailbox full, will try again and send My Chart message. Will fill synthroid and prilosec while waiting on her to call back.

## 2016-11-13 ENCOUNTER — Telehealth: Payer: Self-pay | Admitting: Family Medicine

## 2016-11-13 DIAGNOSIS — G2581 Restless legs syndrome: Secondary | ICD-10-CM

## 2016-11-13 MED ORDER — PRAMIPEXOLE DIHYDROCHLORIDE 0.125 MG PO TABS: ORAL_TABLET | ORAL | 0 refills | Status: DC

## 2016-11-13 NOTE — Telephone Encounter (Signed)
Pt called to give Dr Tomi Bamberger an update that she is feeling better. Side effects are gone away. Hand tremors are gone. Pt is ready to move on to the next step for treating restless legs.

## 2016-11-13 NOTE — Telephone Encounter (Signed)
Pt informed and verbalized understanding

## 2016-11-13 NOTE — Telephone Encounter (Signed)
Tried to call patient but mailbox is full  

## 2016-11-13 NOTE — Telephone Encounter (Signed)
Please advise patient that as per our previous discussion, I sent in a prescription for Mirapex to [facility].  She should take 1 tablet 2-3 hours before bedtime.  After a week, if tolerating without significant side effects, but if not effective enough in treating her restless legs, she should increase to 2 tablets (taken together).  I sent rx for #60, so she can touch base and let us know how she is doing to see if we should further titrate up and change dose for refills.

## 2016-11-20 ENCOUNTER — Telehealth: Payer: Self-pay

## 2016-11-20 NOTE — Telephone Encounter (Signed)
Advise pt okay to stop med.  At this point, since she didn't do well with either clonazepam or mirapex to help with her insomnia and restless legs, I recommend referral to sleep specialist.  Refer to Dr. Annamaria Boots (who read her sleep study).

## 2016-11-20 NOTE — Telephone Encounter (Signed)
Pt reports that the mirapex is keeping her up at night and she can not function. She has only had it for 4 nights. She wants to stop this medication. She averages 3 hours at night. [phone].

## 2016-11-21 ENCOUNTER — Other Ambulatory Visit: Payer: Self-pay | Admitting: *Deleted

## 2016-11-21 DIAGNOSIS — G2581 Restless legs syndrome: Secondary | ICD-10-CM

## 2016-11-21 DIAGNOSIS — G4700 Insomnia, unspecified: Secondary | ICD-10-CM

## 2016-11-21 NOTE — Telephone Encounter (Signed)
Spoke with patient and she was okay with referral. Called pulm and Dr Annamaria Boots is no longer taking any new patients. Dr Elsworth Soho and Halford Chessman are but first appt for either is mid Sept, no cancellation list either. Is this okay?

## 2016-11-21 NOTE — Telephone Encounter (Signed)
I'm guessing it didn't help that he (Dr. Annamaria Boots) had read her sleep study? Other option to look into would be the sleep docs through the cardiologist office.  I think Dr. Radford Pax was doing sleep medicine.  Maybe check there? I don't think September will be acceptable for patient.

## 2016-11-21 NOTE — Telephone Encounter (Signed)
Put in referral to Dr Claiborne Billings, he is scheduling into the end of June, Dr Radford Pax is booked into end of July-I let patient know and to be on the lookout for a call from them and to please let me know if they do not contact her in an appropriate amount of time.

## 2016-12-31 ENCOUNTER — Other Ambulatory Visit: Payer: Self-pay | Admitting: Family Medicine

## 2016-12-31 DIAGNOSIS — J309 Allergic rhinitis, unspecified: Secondary | ICD-10-CM

## 2017-01-29 ENCOUNTER — Telehealth: Payer: Self-pay | Admitting: Family Medicine

## 2017-01-29 NOTE — Telephone Encounter (Signed)
Looks like referral was done in May to Dr. Claiborne Billings, and someone closed it on 7/20 (?) Why was this closed and not scheduled? Please look into this.  She had IPPE done 12/2015 and doesn't have any CPE/AWV scheduled.  She is due also for med check in October (last OV here was in April, seen q6 mos).    Please print rx for Adderall XR 30mg  #90, no refill, for me to sign tomorrow. She will need at least a med check in October, but needs CPE/AWV scheduled, put on cancelation list--technically can do anytime now, since >1 year (doesn't need to wait until October, can be done as soon as one opens up.)

## 2017-01-29 NOTE — Telephone Encounter (Signed)
PT called requesting refill on Adderall XR 30 mg. Also pt said she has never heard from the specialist about an appointment regarding her restless legs.

## 2017-01-30 ENCOUNTER — Other Ambulatory Visit: Payer: Self-pay | Admitting: *Deleted

## 2017-01-30 ENCOUNTER — Telehealth: Payer: Self-pay | Admitting: *Deleted

## 2017-01-30 DIAGNOSIS — F988 Other specified behavioral and emotional disorders with onset usually occurring in childhood and adolescence: Secondary | ICD-10-CM

## 2017-01-30 MED ORDER — AMPHETAMINE-DEXTROAMPHET ER 30 MG PO CP24: 30.0000 mg | ORAL_CAPSULE | ORAL | 0 refills | Status: DC

## 2017-01-30 NOTE — Telephone Encounter (Signed)
error 

## 2017-01-30 NOTE — Telephone Encounter (Signed)
Needs TSH and LFT's in October--neither of these are fasting labs, so we can do them at her visit. We can put in future orders for her physical when she is here in October (she will needing fasting labs for physical)

## 2017-01-30 NOTE — Telephone Encounter (Signed)
Patient called back and scheduled med check for 04/10/17 wants to know if she needs to come for labs prior to this appt. Also scheduled CPE for 09/16/17 and wants to know if she needs labs prior to that appt as well.

## 2017-01-30 NOTE — Telephone Encounter (Signed)
Left message for patient to return my call or come in and see me when she picks up her rx and we can discuss referral and make next appts.

## 2017-02-05 ENCOUNTER — Other Ambulatory Visit: Payer: Self-pay | Admitting: Family Medicine

## 2017-02-05 DIAGNOSIS — J309 Allergic rhinitis, unspecified: Secondary | ICD-10-CM

## 2017-02-05 NOTE — Telephone Encounter (Signed)
Is this okay to refill? 

## 2017-02-12 ENCOUNTER — Other Ambulatory Visit: Payer: Self-pay | Admitting: Family Medicine

## 2017-02-12 DIAGNOSIS — E7800 Pure hypercholesterolemia, unspecified: Secondary | ICD-10-CM

## 2017-02-12 DIAGNOSIS — K219 Gastro-esophageal reflux disease without esophagitis: Secondary | ICD-10-CM

## 2017-02-12 DIAGNOSIS — E039 Hypothyroidism, unspecified: Secondary | ICD-10-CM

## 2017-03-10 ENCOUNTER — Other Ambulatory Visit: Payer: Self-pay | Admitting: Family Medicine

## 2017-03-10 DIAGNOSIS — K219 Gastro-esophageal reflux disease without esophagitis: Secondary | ICD-10-CM

## 2017-03-10 DIAGNOSIS — E039 Hypothyroidism, unspecified: Secondary | ICD-10-CM

## 2017-03-11 ENCOUNTER — Other Ambulatory Visit: Payer: Self-pay | Admitting: Family Medicine

## 2017-03-11 DIAGNOSIS — J309 Allergic rhinitis, unspecified: Secondary | ICD-10-CM

## 2017-03-11 NOTE — Telephone Encounter (Signed)
Pt has an appt in October. Will refill med until then

## 2017-03-19 ENCOUNTER — Ambulatory Visit: Payer: Self-pay | Admitting: Cardiovascular Disease

## 2017-03-20 ENCOUNTER — Ambulatory Visit: Payer: Medicare Other | Admitting: Cardiovascular Disease

## 2017-04-09 NOTE — Progress Notes (Signed)
Chief Complaint  Patient presents with  . Hypothyroidism    med check, nonfasting.   . Flu Vaccine    wants to wait.   Hypothyroidism:  In January her Synthroid dose was increased from 25 to 32mg daily.  She reports some weight gain since June.  She has ongoing fatigue, unsure if related to poor sleep (which continues).  Denies changes to hair/skin/bowels.    Lab Results  Component Value Date   TSH 0.62 10/08/2016   GERD: She was changed from famotidine to omeprazole 461mwhen having a flare in January 2018. This is working very well for her.  She needed Tums only once for breakthrough symptoms.   She has appt 10/16 with Dr. KeClaiborne Billingsor sleep consult.  This referral was originally put in system in May.  6 months ago she had a lot of fatigue, anxiety, worsening insomnia and restless legs.  She did not tolerate clonazepam at all.  She was put on prozac for a short while, but she had a lot of nausea. At her last visit, the prozac was stopped, she was put on PPI and zofran to help with nausea, and then once that improved, she was to start Mirapex for treatment of RLS. She never started the mirapex. She didn't recall the phone conversation on 5/15 when this was called in for her. She continues to take Trazadone. It helps her fall asleep. She continues to toss and turn and have restless legs. Exacerbation of her fatigue 6 months ago was felt to be multi-factorial--poor sleep (trouble getting to sleep, plus PLMD interfering with her sleep), nausea. The nausea has resolved.  Still has ongoing issues with trouble falling/staying asleep, feels exhausted all the time.  She also is worried about her memory, doesn't remember some conversations with her husband.  She is more disorganized than normal.  She is very distracted, not finishing projects. She blames everything on the fatigue. She is very worried/anxious about her memory, and about her sleep/exhaustion.  She dreads going to sleep. Easily  overwhelmed.  (Previously discussed Belsomra--may be a better option if trazadone isn't effective, clonazepam made her far too drowsy, and ambien had an adverse response.)  ADD: She continues to take 3029mf Adderall. Deniesside effects. Last filled #90 in 01/2017. She wonders if she needs a higher dose, since she is having so much trouble.  Hyperlipidemia: She was tried off simvastatin in the past (she was only on very low dose, tried without--still high, so then we tried treating mild hypothyroidism, but lipids didn't change). Went back on 44m37m simvastatin (given family h/o CAD). Tolerating it fine without any side effects. Some reports some foot cramps at night.  No myalgias. She saw Dr. PaulDorris CarnesOctober 2017 for consult, who sent her for Ca score CT, which showed Ca score of 0, no evidence of calcified plaque. Suggested cutting back to dose of 5mg 60msimvastatin can consider stopping. She didn't feel like there was good communication with her; she preferred to stay on the 44mg 72m. Lab Results  Component Value Date   CHOL 188 07/05/2016   HDL 91 07/05/2016   LDLCALC 76 07/05/2016   TRIG 107 07/05/2016   CHOLHDL 2.1 07/05/2016   Osteopenia:  Per DEXA in January. She reports her GYn wants her to start on alendronate., She is hesitant to do so, and is asking my advice. She doesn't take calcium (worries about her heart), doesn't drink milk or get much dairy in her diet. Hasn't  been exercising over the last few months.  PMH, PSH, SH reviewed  Outpatient Encounter Prescriptions as of 04/10/2017  Medication Sig Note  . amphetamine-dextroamphetamine (ADDERALL XR) 30 MG 24 hr capsule Take 1 capsule (30 mg total) by mouth every morning.   Marland Kitchen aspirin 81 MG tablet Take 81 mg by mouth daily. 04/14/2015: .   . calcium carbonate (TUMS - DOSED IN MG ELEMENTAL CALCIUM) 500 MG chewable tablet Chew 1-2 tablets by mouth as needed for indigestion or heartburn. 12/26/2015: Uses prn, not daily  .  cetirizine (ZYRTEC) 10 MG tablet Take 10 mg by mouth daily.   . fluticasone (FLONASE) 50 MCG/ACT nasal spray USE 2 SPRAYS IN EACH NOSTRIL ONCE A DAY.   Marland Kitchen GLUCOSAMINE-CHONDROITIN PO Take by mouth as directed.   . mupirocin nasal ointment (BACTROBAN NASAL) 2 % Apply to the affected area of skin at left nostril twice daily for 5-7 days. 04/10/2017: Uses prn  . Omega-3 Fatty Acids (FISH OIL) 1000 MG CAPS Take 1 capsule by mouth daily.   Marland Kitchen omeprazole (PRILOSEC) 40 MG capsule TAKE 1 CAPSULE EVERY DAY.   Marland Kitchen OVER THE COUNTER MEDICATION vagifilm   . simvastatin (ZOCOR) 10 MG tablet TAKE ONE TABLET AT BEDTIME.   . SYNTHROID 50 MCG tablet TAKE 1 TABLET ONCE DAILY BEFORE BREAKFAST.   Marland Kitchen traZODone (DESYREL) 50 MG tablet TAKE 1 OR 2 TABLETS AT BEDTIME AS NEEDED FOR SLEEP.   Marland Kitchen TURMERIC PO Take by mouth daily.   Marland Kitchen zolpidem (AMBIEN) 10 MG tablet Take 0.5-1 tablets (5-10 mg total) by mouth at bedtime as needed for sleep. (Patient not taking: Reported on 10/08/2016)   . [DISCONTINUED] furosemide (LASIX) 20 MG tablet Take 1/2 - 1 tablet by mouth in the morning, if needed for fluid retention (Patient not taking: Reported on 10/08/2016)   . [DISCONTINUED] ondansetron (ZOFRAN ODT) 4 MG disintegrating tablet Take 1 tablet (4 mg total) by mouth every 8 (eight) hours as needed for nausea or vomiting.   . [DISCONTINUED] pramipexole (MIRAPEX) 0.125 MG tablet Take 1 tablet by mouth 2-3 hrs before bedtime. Increase to 2 tablets after a week (if needed)    No facility-administered encounter medications on file as of 04/10/2017.    Allergies  Allergen Reactions  . Morphine And Related Other (See Comments)    Family history.  . Codeine Itching and Rash   ROS: no fever, chills, URI symptoms.  Allergies controlled with anti-histamines and flonase.  Denies nausea.  Heartburn is controlled with prilosec.  Denies bowel changes.  +mild weight gain, which she is very unhappy about.  +insomnia, problems with concentration, memory. See HPI  for details. Denies generalized anxiety or depression. See HPI   PHYSICAL EXAM:  BP 110/68 (BP Location: Left Arm, Patient Position: Sitting, Cuff Size: Normal)   Pulse 84   Ht 4' 10.5" (1.486 m)   Wt 110 lb 12.8 oz (50.3 kg)   BMI 22.76 kg/m   Wt Readings from Last 3 Encounters:  04/10/17 110 lb 12.8 oz (50.3 kg)  10/08/16 106 lb 12.8 oz (48.4 kg)  08/27/16 114 lb 3.2 oz (51.8 kg)   Pleasant female, who seems quite fine initially, but once she gets talking, gets overwhelmed with how she is feeling, about her poor sleep, memory concerns, weight gain, and talks repeatedly about these concerns. She does not lose track of her thoughts or jump from topic to topic.  She seems somewhat anxious.  She has normal hygiene, grooming, speech (not pressured) and eye contact  HEENT: PERRL, EOMI, conjunctiva and sclera are clear. OP with some cobblestoning posteriorly. Normal nasal exam, sinuses nontender Neck: no lymphadenopathy or mass Heart: regular rate and rhythm Lungs: clear bilaterally Back: no CVA tenderness Abdomen: soft, nontender, no organomegaly or mass Extremities: no edema, normal pulses Skin: tanned, no lesions, normal turgor Psych: alert and oriented, full range of affect, normal hygiene and grooming.  See above.  ASSESSMENT/PLAN:  Hypothyroidism, unspecified type - euthyroid by history; recheck TSH and refill Synthroid when labs back - Plan: TSH, TSH  Restless leg syndrome - somehow never got message/realized that Mirapex was called in for her to try in May. Await sleep consult scheduled next week  Insomnia, unspecified type - trazadone helps get her to sleep; sleep is interrupted by RLS. Continue trazadone for now - Plan: traZODone (DESYREL) 50 MG tablet  Attention deficit disorder, unspecified hyperactivity presence - continue current dose; trouble with concentration may be multi-factorial, contributed by poor sleep; consider neuropsych testing, psych eval  Pure  hypercholesterolemia - continue current low dose statin - Plan: Lipid panel  Gastroesophageal reflux disease, esophagitis presence not specified - continue 70m omeprazole; consider trying OTC 261mas taper. Risks of prolonged PPI use vs untreated GERD reviewed - Plan: omeprazole (PRILOSEC) 40 MG capsule  Medication monitoring encounter - Plan: TSH, Hepatic function panel, CBC with Differential/Platelet, Lipid panel, TSH, Comprehensive metabolic panel, Magnesium  Need for influenza vaccination - Plan: Flu vaccine HIGH DOSE PF (Fluzone High dose)  Osteopenia of necks of both femurs - counseled re: calcium, D, weight-bearing exercise, risks/side effects of meds. May do better with monthly med due to thyroid meds. Prolia if/when osteoporosis   TSH and LFT's today  F/u as scheduled in March for CPE, with labs prior CBC, c-met, lipids, TSH, mg   50 min visit, more than 1/2 spent counseling

## 2017-04-10 ENCOUNTER — Other Ambulatory Visit: Payer: Self-pay | Admitting: Family Medicine

## 2017-04-10 ENCOUNTER — Ambulatory Visit (INDEPENDENT_AMBULATORY_CARE_PROVIDER_SITE_OTHER): Payer: Medicare Other | Admitting: Family Medicine

## 2017-04-10 ENCOUNTER — Encounter: Payer: Self-pay | Admitting: Family Medicine

## 2017-04-10 VITALS — BP 110/68 | HR 84 | Ht 58.5 in | Wt 110.8 lb

## 2017-04-10 DIAGNOSIS — J309 Allergic rhinitis, unspecified: Secondary | ICD-10-CM

## 2017-04-10 DIAGNOSIS — M85851 Other specified disorders of bone density and structure, right thigh: Secondary | ICD-10-CM

## 2017-04-10 DIAGNOSIS — Z23 Encounter for immunization: Secondary | ICD-10-CM

## 2017-04-10 DIAGNOSIS — G4700 Insomnia, unspecified: Secondary | ICD-10-CM | POA: Diagnosis not present

## 2017-04-10 DIAGNOSIS — M85852 Other specified disorders of bone density and structure, left thigh: Secondary | ICD-10-CM | POA: Diagnosis not present

## 2017-04-10 DIAGNOSIS — F988 Other specified behavioral and emotional disorders with onset usually occurring in childhood and adolescence: Secondary | ICD-10-CM

## 2017-04-10 DIAGNOSIS — E039 Hypothyroidism, unspecified: Secondary | ICD-10-CM

## 2017-04-10 DIAGNOSIS — G2581 Restless legs syndrome: Secondary | ICD-10-CM | POA: Diagnosis not present

## 2017-04-10 DIAGNOSIS — K219 Gastro-esophageal reflux disease without esophagitis: Secondary | ICD-10-CM

## 2017-04-10 DIAGNOSIS — E7800 Pure hypercholesterolemia, unspecified: Secondary | ICD-10-CM | POA: Diagnosis not present

## 2017-04-10 DIAGNOSIS — Z5181 Encounter for therapeutic drug level monitoring: Secondary | ICD-10-CM | POA: Diagnosis not present

## 2017-04-10 LAB — HEPATIC FUNCTION PANEL
AG RATIO: 1.8 (calc) (ref 1.0–2.5)
ALKALINE PHOSPHATASE (APISO): 61 U/L (ref 33–130)
ALT: 11 U/L (ref 6–29)
AST: 19 U/L (ref 10–35)
Albumin: 4.2 g/dL (ref 3.6–5.1)
BILIRUBIN DIRECT: 0.1 mg/dL (ref 0.0–0.2)
BILIRUBIN INDIRECT: 0.3 mg/dL (ref 0.2–1.2)
BILIRUBIN TOTAL: 0.4 mg/dL (ref 0.2–1.2)
Globulin: 2.4 g/dL (calc) (ref 1.9–3.7)
TOTAL PROTEIN: 6.6 g/dL (ref 6.1–8.1)

## 2017-04-10 LAB — TSH: TSH: 1.4 mIU/L (ref 0.40–4.50)

## 2017-04-10 MED ORDER — OMEPRAZOLE 40 MG PO CPDR: 40.0000 mg | DELAYED_RELEASE_CAPSULE | Freq: Every day | ORAL | 1 refills | Status: DC

## 2017-04-10 MED ORDER — TRAZODONE HCL 50 MG PO TABS: ORAL_TABLET | ORAL | 3 refills | Status: DC

## 2017-04-10 NOTE — Patient Instructions (Addendum)
Please try to get at least 1200 mg of calcium from your diet, and if you cannot, take some low dose supplement to make up the difference.  It is important to get calcium and Vitamin D, along with regular weight-bearing exercise to try and prevent further bone loss.  We briefly discussed tolerability and timing issues of fosamax (may be challenging with the synthroid).  You might do better with a monthly pill rather than a weekly.  Ultimately, if your next scan shows osteoPOROSIS, you would be a candidate for Prolia injections twice a year.  Taking a multivitamin with magnesium is a good idea when on omeprazole long-term  It is recommended that you get at least 30 minutes of aerobic exercise at least 5 days/week (for weight loss, you may need as much as 60-90 minutes). This can be any activity that gets your heart rate up. This can be divided in 10-15 minute intervals if needed, but try and build up your endurance at least once a week.  Weight bearing exercise is also recommended twice weekly.  Regularly exercise can help with the energy during the day, and help with your sleep.  I do think that treating your restless legs will give you a better quality of sleep, and your energy should improve some. We had intended to start you on a Mirapex trial in May, but at this point, I would wait until you see Dr. Claiborne Billings next week.  Poor sleep can definitely contribute to concentration and memory issues.  Let's see how these improve with better sleep.  If they don't improve, consider further evaluation with neuropsych testing.  It may be that we need a psychiatrist's input on your medication management (for ADD, insomnia, anxiety, etc).  Consider trying the over-the-counter Prilosec at some point over the next few months, to see if it is equally as effective as the 40mg .  Ultimately I'd like to have you on the lowest effective dose, to minimize any risks.  I will refill the thyroid medication tomorrow after I get  your lab results back.

## 2017-04-11 MED ORDER — SYNTHROID 50 MCG PO TABS: ORAL_TABLET | ORAL | 1 refills | Status: DC

## 2017-04-12 ENCOUNTER — Other Ambulatory Visit: Payer: Self-pay | Admitting: Family Medicine

## 2017-04-12 DIAGNOSIS — J309 Allergic rhinitis, unspecified: Secondary | ICD-10-CM

## 2017-04-16 ENCOUNTER — Ambulatory Visit (INDEPENDENT_AMBULATORY_CARE_PROVIDER_SITE_OTHER): Payer: Medicare Other | Admitting: Cardiovascular Disease

## 2017-04-16 ENCOUNTER — Encounter: Payer: Self-pay | Admitting: Cardiovascular Disease

## 2017-04-16 VITALS — BP 108/66 | HR 67 | Ht 59.0 in | Wt 111.4 lb

## 2017-04-16 DIAGNOSIS — R5383 Other fatigue: Secondary | ICD-10-CM

## 2017-04-16 DIAGNOSIS — G4761 Periodic limb movement disorder: Secondary | ICD-10-CM

## 2017-04-16 DIAGNOSIS — E039 Hypothyroidism, unspecified: Secondary | ICD-10-CM

## 2017-04-16 DIAGNOSIS — G4730 Sleep apnea, unspecified: Secondary | ICD-10-CM

## 2017-04-16 DIAGNOSIS — G4700 Insomnia, unspecified: Secondary | ICD-10-CM

## 2017-04-16 DIAGNOSIS — R0683 Snoring: Secondary | ICD-10-CM | POA: Diagnosis not present

## 2017-04-16 DIAGNOSIS — E781 Pure hyperglyceridemia: Secondary | ICD-10-CM

## 2017-04-16 DIAGNOSIS — G478 Other sleep disorders: Secondary | ICD-10-CM | POA: Diagnosis not present

## 2017-04-16 DIAGNOSIS — G4719 Other hypersomnia: Secondary | ICD-10-CM

## 2017-04-16 MED ORDER — ESZOPICLONE 2 MG PO TABS: 2.0000 mg | ORAL_TABLET | Freq: Every evening | ORAL | 2 refills | Status: DC | PRN

## 2017-04-16 NOTE — Patient Instructions (Addendum)
Medication Instructions:  STOP trazodone  START Lunesta 2 mg (1 tablet) as needed at bedtime --take on the night of sleep study  Testing/Procedures: Your physician has recommended that you have a sleep study. This test records several body functions during sleep, including: brain activity, eye movement, oxygen and carbon dioxide blood levels, heart rate and rhythm, breathing rate and rhythm, the flow of air through your mouth and nose, snoring, body muscle movements, and chest and belly movement.  Follow-Up: Your physician recommends that you schedule a follow-up appointment in: 2 months with Dr. Claiborne Billings (sleep clinic)   Any Other Special Instructions Will Be Listed Below (If Applicable).     If you need a refill on your cardiac medications before your next appointment, please call your pharmacy.

## 2017-04-21 NOTE — Progress Notes (Signed)
Cardiology Office Note    Date:  04/21/2017   ID:  Nicole Allen, DOB 11/13/1950, MRN [id number]  PCP:  Rita Ohara, MD  Cardiologist:  Shelva Majestic, MD   Chief Complaint  Patient presents with  . New Patient (Initial Visit)    SLEEP   Sleep consultation, referred by Dr.Knapp  History of Present Illness:  Nicole Allen is a 66 y.o. female who had undergone a sleep study interpreted by [name] in August 2017.  The patient has continued to have significant difficulty with sleep and is now referred by [name] for sleep evaluation and possible restless legs.  [name] has a history of hypothyroidism, limb movements of sleep, attention deficit disorder, hyperlipidemia, and GERD.  She was having significant difficulty with sleep with some insomnia and ultimately was referred for sleep study in 02/24/2016 which was interpreted by Dr. Annamaria Boots.  I personally reviewed this sleep study in detail.  The patient had reduced sleep efficiency at 51.7%.  She had markedly prolonged rim latency at 266 minutes.  She also had significant reduction in rems sleep at only 4%.  Wake after sleep onset was 137 minutes.  She was found to have an overall AHI of 4.5 per hour.  Do not see any mention of the respiratory disturbance index, but I suspect this was increased based on at least 17 respiratory effort related arousals.  AHI while sleeping supine, was 11.7 and consistent with mild sleep apnea.  She had moderate snoring.  There was no significant oxygen desaturation.  She had slight increase in periodic limb movements of sleep at 51, resulting in index of 16.36.  Associated arousals with leg movement index was 5.8.,  Pain upon further questioning, the patient denies any urge to move or painful restless legs.  Reportedly, she was told to have a trial of Mirapex by [name], which she never did.  She had difficulty with sleep initiation and maintenance.   Presently, she has continues to have sleep  difficulty.  She typically goes to bed between 10 and 11 PM at night, but may stay awake for several hours before she falls asleep.  However, if she stays up until 1 AM and tries to go to sleep then she typically falls asleep in about 30 minutes.  She often wakes up between 8:30 AM and 10 AM.  She notes frequent awakenings.  She was given a prescription for trazodone for sleep, which has not been very helpful.  She did not tolerate Ambien in the past.  She minutes to being tired during the day.  She set up point now where she does not feel safe driving toward the daytime since she may fall a sleep.  She is now referred for evaluation.   Review of her records reveals that 2017.  She had been seen by Dr. Dorris Carnes for evaluation of hyperlipidemia.  The calcium score was recommended but this was never followed up with.   Past Medical History:  Diagnosis Date  . ADD (attention deficit disorder)   . Allergy    SEASONAL  . BCC (basal cell carcinoma of skin)    chest; Dr. Tonia Brooms  . BCC (basal cell carcinoma)    Tonia Brooms  . Cervical spondylosis   . Hearing loss    high frequency (Dr. Prescott Parma); hearing aids recommended; bilateral  . History of kidney stones    x3  . Hypothyroidism 2015  . Insomnia    worse with menopause  .  Kidney stone    x3, Dr. Reece Agar  . Osteopenia   . Postmenopausal    on HRT--sees GYN  . Pure hypercholesterolemia     Past Surgical History:  Procedure Laterality Date  . CATARACT EXTRACTION Bilateral L 10/18/15 R 12/13/15   Dr. Herbert Deaner  . CESAREAN SECTION  [id number]  . COLONOSCOPY  2003, 2013   Dr. Collene Mares  . HYMENECTOMY  1993  . KNEE SURGERY Right 09/29/2014   Dr. Jim Desanctis replacement/reconstruction  . TONSILLECTOMY AND ADENOIDECTOMY    . TUBAL LIGATION  1991    Current Medications: Outpatient Medications Prior to Visit  Medication Sig Dispense Refill  . amphetamine-dextroamphetamine (ADDERALL XR) 30 MG 24 hr capsule Take 1 capsule (30 mg total) by mouth every  morning. 90 capsule 0  . aspirin 81 MG tablet Take 81 mg by mouth daily.    . cetirizine (ZYRTEC) 10 MG tablet Take 10 mg by mouth daily.    . fluticasone (FLONASE) 50 MCG/ACT nasal spray USE 2 SPRAYS IN EACH NOSTRIL ONCE A DAY. 16 g 2  . GLUCOSAMINE-CHONDROITIN PO Take 1 capsule by mouth once a week.     . mupirocin nasal ointment (BACTROBAN NASAL) 2 % Apply to the affected area of skin at left nostril twice daily for 5-7 days. (Patient taking differently: as needed. Apply to the affected area of skin at left nostril twice daily for 5-7 days.) 10 g 0  . Omega-3 Fatty Acids (FISH OIL) 1000 MG CAPS Take 1 capsule by mouth once a week.     Marland Kitchen omeprazole (PRILOSEC) 40 MG capsule Take 1 capsule (40 mg total) by mouth daily. 90 capsule 1  . OVER THE COUNTER MEDICATION vagifilm    . simvastatin (ZOCOR) 10 MG tablet TAKE ONE TABLET AT BEDTIME. 90 tablet 0  . SYNTHROID 50 MCG tablet TAKE 1 TABLET ONCE DAILY BEFORE BREAKFAST. 90 tablet 1  . TURMERIC PO Take by mouth daily.    . traZODone (DESYREL) 50 MG tablet TAKE 1 OR 2 TABLETS AT BEDTIME AS NEEDED FOR SLEEP. 60 tablet 3  . calcium carbonate (TUMS - DOSED IN MG ELEMENTAL CALCIUM) 500 MG chewable tablet Chew 1-2 tablets by mouth as needed for indigestion or heartburn.    . zolpidem (AMBIEN) 10 MG tablet Take 0.5-1 tablets (5-10 mg total) by mouth at bedtime as needed for sleep. (Patient not taking: Reported on 10/08/2016) 30 tablet 1   No facility-administered medications prior to visit.      Allergies:   Morphine and related and Codeine   Social History   Social History  . Marital status: Married    Spouse name: N/A  . Number of children: 2  . Years of education: N/A   Occupational History  . retired Teaching laboratory technician    Social History Main Topics  . Smoking status: Never Smoker  . Smokeless tobacco: Never Used  . Alcohol use 0.0 oz/week     Comment: 5-7/week (2 drinks on the weekends, occasional glass during the week once or twice).  .  Drug use: No  . Sexual activity: Yes    Partners: Male   Other Topics Concern  . None   Social History Narrative   Lives with husband.  Son in [place], [place] in [place], Daughter is in [place]. Grandson (born 04/2015, in [place]).   Son with alcohol issues (and arrested for domestic violence summer/fall 2017)     Additional social history is notable that she was born in [place], [place].  She is  a Engineer, water by trade.  She has ADD.  There is no tobacco history.  Family History:  The patient's family history includes ADD / ADHD in her daughter and son; Alzheimer's disease in her maternal grandmother; Arthritis in her mother; Asthma in her mother; Cancer in her maternal grandfather and paternal grandfather; Heart disease in her father; Hyperlipidemia in her father and mother; Hypertension in her father and mother; Osteoporosis in her mother.   Her mother died at 76, father died at age 22 and had suffered a myocardial infarction.  She has one sister with obstructive sleep apnea and her father also had obstructive sleep apnea.  ROS General: Negative; No fevers, chills, or night sweats;  HEENT: Negative; No changes in vision or hearing, sinus congestion, difficulty swallowing Pulmonary: Negative; No cough, wheezing, shortness of breath, hemoptysis Cardiovascular: Negative; No chest pain, presyncope, syncope, palpitations GI: Negative; No nausea, vomiting, diarrhea, or abdominal pain GU: Negative; No dysuria, hematuria, or difficulty voiding Musculoskeletal: Negative; no myalgias, joint pain, or weakness Hematologic/Oncology: Negative; no easy bruising, bleeding Endocrine: Negative; no heat/cold intolerance; no diabetes Neuro: Negative; no changes in balance, headaches Skin: Negative; No rashes or skin lesions Psychiatric: Negative; No behavioral problems, depression Sleep: Positive for insomnia, snoring, fatigability, limited movement disorder of sleep, no hypnogognic hallucinations, no  cataplexy Other comprehensive 14 point system review is negative.   PHYSICAL EXAM:   VS:  BP 108/66   Pulse 67   Ht _0  (1.499 m)   Wt 111 lb 6.4 oz (50.5 kg)   BMI 22.50 kg/m     Repeat blood pressure by me 118/68  Wt Readings from Last 3 Encounters:  04/16/17 111 lb 6.4 oz (50.5 kg)  04/10/17 110 lb 12.8 oz (50.3 kg)  10/08/16 106 lb 12.8 oz (48.4 kg)    General: Alert, oriented, no distress.  Skin: normal turgor, no rashes, warm and dry HEENT: Normocephalic, atraumatic. Pupils equal round and reactive to light; sclera anicteric; extraocular muscles intact; Fundi without hemorrhages or exudates.  Disks flat Nose without nasal septal hypertrophy Mouth/Parynx benign; Mallinpatti scale 2 Neck: No JVD, no carotid bruits; normal carotid upstroke Lungs: clear to ausculatation and percussion; no wheezing or rales Chest wall: without tenderness to palpitation Heart: PMI not displaced, RRR, s1 s2 normal, 1/6 systolic murmur, no diastolic murmur, no rubs, gallops, thrills, or heaves Abdomen: soft, nontender; no hepatosplenomehaly, BS+; abdominal aorta nontender and not dilated by palpation. Back: no CVA tenderness Pulses 2+ Musculoskeletal: full range of motion, normal strength, no joint deformities Extremities: no clubbing cyanosis or edema, Homan's sign negative  Neurologic: grossly nonfocal; Cranial nerves grossly wnl Psychologic: Normal mood and affect   Studies/Labs Reviewed:   EKG:  EKG is ordered today.  ECG (independently read by me): Normal sinus rhythm at 67 bpm.  No ectopy.  QTC interval 467 ms.  Recent Labs: BMP Latest Ref Rng & Units 07/05/2016 12/26/2015 04/11/2015  Glucose 65 - 99 mg/dL 92 91 92  BUN 7 - 25 mg/dL 15 - 15  Creatinine 0.50 - 0.99 mg/dL 0.63 - 0.62  Sodium 135 - 146 mmol/L 138 - 138  Potassium 3.5 - 5.3 mmol/L 4.1 - 4.3  Chloride 98 - 110 mmol/L 105 - 103  CO2 20 - 31 mmol/L 25 - 26  Calcium 8.6 - 10.4 mg/dL 8.7 - 9.0     Hepatic Function  Latest Ref Rng & Units 04/10/2017 07/05/2016 12/26/2015  Total Protein 6.1 - 8.1 g/dL 6.6 6.1 6.4  Albumin 3.6 -  5.1 g/dL - 4.1 4.2  AST 10 - 35 U/L _0 ALT 6 - 29 U/L _1 Alk Phosphatase 33 - 130 U/L - 42 49  Total Bilirubin 0.2 - 1.2 mg/dL 0.4 0.6 0.5  Bilirubin, Direct 0.0 - 0.2 mg/dL 0.1 - 0.1    CBC Latest Ref Rng & Units 10/08/2016 07/05/2016 07/28/2015  WBC 4.0 - 10.5 K/uL 5.7 4.9 4.5  Hemoglobin 11.7 - 15.5 g/dL 13.4 11.9 12.5  Hematocrit 35.0 - 45.0 % 40.0 35.6 36.9  Platelets 140 - 400 K/uL 209 201 195   Lab Results  Component Value Date   MCV 85.3 10/08/2016   MCV 88.3 07/05/2016   MCV 85.8 07/28/2015   MCV 85.8 07/28/2015   Lab Results  Component Value Date   TSH 1.40 04/10/2017   No results found for: HGBA1C   BNP No results found for: BNP  ProBNP No results found for: PROBNP   Lipid Panel     Component Value Date/Time   CHOL 188 07/05/2016 0724   TRIG 107 07/05/2016 0724   HDL 91 07/05/2016 0724   CHOLHDL 2.1 07/05/2016 0724   VLDL 21 07/05/2016 0724   LDLCALC 76 07/05/2016 0724     RADIOLOGY: No results found.   Additional studies/ records that were reviewed today include:  I reviewed the patient's sleep study in detail.    ASSESSMENT:    1. PLMD (periodic limb movement disorder)   2. Sleep apnea, unspecified type   3. Snoring   4. Excessive daytime sleepiness   5. Fatigue, unspecified type   6. Non-restorative sleep   7. Frequent nocturnal awakening   8. Pure hyperglyceridemia   9. Hypothyroidism, unspecified type      PLAN:  Ms. 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  She also is a history of hypothyroidism which has been stable on levothyroxine 50 g.  She has attention deficit disorder and takes adderall XR  30 mg every morning.  She's had issues with sleep.  There are issues both with sleep initiation as well as  with sleep maintenance.  I suspect she has sleep apnea, unspecified type.  After review of her sleep study.  Her 8 HI was borderline for mild sleep apnea and she did have an increased number of respiratory effort-related arousals and I suspect her RDI was increased.  In addition, there was mild sleep apnea with supine sleep.  She had poor sleep efficiency, and had markedly reduced REM sleep at only 4%.  As result, I suspect she may have more sleep apnea, then her sleep study indicates.  With her significant issues both with sleep initiation and maintenance, I have recommended a trial of Lunesta 2 mg and with its half-life should significantly benefit both issues.  I have recommended she discontinue trazodone.  In addition, although she does have periodic limb movement disorder of sleep, when asked specifically if she has the urge to move or if her legs are painful she denies this, and I suspect as result, she does not have definitive restless leg syndrome.  Since she already is on medication for her ADD.  I would not initiate any dopaminergic therapy for her leg movement disorder.  I spent considerable time with the patient in the office today (over 40 minutes) reviewing her sleep study and recommendations.  I have recommended that she undergo a follow-up  sleep study due to continued symptoms and that she take Lunesta at night of the sleep study to aid both with sleep initiation and maintenance.  Hopefully, she will then have improved sleep efficiency, achieve rems sleep and will have a more definitive determination of the severity of sleep apnea, if present.  Presently, she feels that she cannot drive by herself because of the concern that she may follow sleep.  Her blood pressure today is stable.   I will see her in 2 months for reevaluation.   Medication Adjustments/Labs and Tests Ordered: Current medicines are reviewed at length with the patient today.  Concerns regarding medicines are outlined above.   Medication changes, Labs and Tests ordered today are listed in the Patient Instructions below. Patient Instructions  Medication Instructions:  STOP trazodone  START Lunesta 2 mg (1 tablet) as needed at bedtime --take on the night of sleep study  Testing/Procedures: Your physician has recommended that you have a sleep study. This test records several body functions during sleep, including: brain activity, eye movement, oxygen and carbon dioxide blood levels, heart rate and rhythm, breathing rate and rhythm, the flow of air through your mouth and nose, snoring, body muscle movements, and chest and belly movement.  Follow-Up: Your physician recommends that you schedule a follow-up appointment in: 2 months with Dr. Claiborne Billings (sleep clinic)   Any Other Special Instructions Will Be Listed Below (If Applicable).     If you need a refill on your cardiac medications before your next appointment, please call your pharmacy.      Signed, Shelva Majestic, MD  04/21/2017 11:41 AM    [facility] [address] [place], [place], [place]  [address] Phone: [phone]

## 2017-04-23 ENCOUNTER — Telehealth: Payer: Self-pay | Admitting: Cardiovascular Disease

## 2017-04-23 NOTE — Telephone Encounter (Signed)
New message   Patient was told by [facility] pre [name] needed for Nicole Allen. [place] states they have faxed over form for pre auth. Please call. Patient hs not started the medication.  Pt c/o medication issue:  1. Name of Medication: eszopiclone (LUNESTA) 2 MG TABS tablet  2. How are you currently taking this medication (dosage and times per day)? Take 1 tablet (2 mg total) by mouth at bedtime as needed for sleep. Take immediately before bedtime 3. Are you having a reaction (difficulty breathing--STAT)? No  4. What is your medication issue? Patient has not gotten medication filled, pre auth needed.

## 2017-04-24 NOTE — Telephone Encounter (Signed)
No pre-auth information found in provider mail. Will forward to primary nurse.

## 2017-04-25 NOTE — Telephone Encounter (Signed)
Prior auth electronically sent thru cover my meds PA ID# [id number]  [place]  Pt notified she states that she wants Dr Claiborne Billings to call her personally to discuss this, he says no he cannot call her, he states that it he told her that it would be probably denied and it would be faster for her to pay cash. Also, she needs Lunesta because her is not going to sleep it's sleep maintence.

## 2017-04-25 NOTE — Telephone Encounter (Signed)
F/u message  Pt call to f/u on medication request. Please call back to discuss

## 2017-04-26 NOTE — Telephone Encounter (Signed)
Cover my med prior authorization: [place]: [place] - PA Case ID: [id number]  Deniedon October 25  Request Reference Number: [id number]. ESZOPICLONE TAB 2MG  is denied for not meeting the prior authorization requirement(s). For further questions, call [phone]. Appeals are not supported through [place]. Please refer to the fax case notice for appeals information and instructions.

## 2017-04-26 NOTE — Telephone Encounter (Signed)
Spoke to patient-made aware that insurance has denied Lunesta at this time.  Apologized for the delay in the PA completion.  Advised will discuss alternative options with Dr. Claiborne Billings next week.    [place] and generic Nicole Allen is $390 for 30 pills.     Patient appreciated call and verbalized understanding.

## 2017-05-08 ENCOUNTER — Telehealth: Payer: Self-pay | Admitting: Family Medicine

## 2017-05-08 DIAGNOSIS — F988 Other specified behavioral and emotional disorders with onset usually occurring in childhood and adolescence: Secondary | ICD-10-CM

## 2017-05-08 MED ORDER — AMPHETAMINE-DEXTROAMPHET ER 30 MG PO CP24: 30.0000 mg | ORAL_CAPSULE | ORAL | 0 refills | Status: DC

## 2017-05-08 NOTE — Telephone Encounter (Signed)
Printed--please give to me to sign and contact husband when ready

## 2017-05-08 NOTE — Telephone Encounter (Signed)
Patient's husband advised 

## 2017-05-08 NOTE — Telephone Encounter (Signed)
Pt called is requesting a refill on her adderral pt would like a call to her husband at [phone] so he can come get them she is out of town,

## 2017-05-12 ENCOUNTER — Other Ambulatory Visit: Payer: Self-pay | Admitting: Family Medicine

## 2017-05-12 DIAGNOSIS — E7800 Pure hypercholesterolemia, unspecified: Secondary | ICD-10-CM

## 2017-05-14 ENCOUNTER — Encounter (HOSPITAL_BASED_OUTPATIENT_CLINIC_OR_DEPARTMENT_OTHER): Payer: Self-pay

## 2017-06-08 ENCOUNTER — Ambulatory Visit (HOSPITAL_BASED_OUTPATIENT_CLINIC_OR_DEPARTMENT_OTHER): Payer: Medicare Other | Attending: Cardiovascular Disease | Admitting: Cardiovascular Disease

## 2017-06-08 DIAGNOSIS — G478 Other sleep disorders: Secondary | ICD-10-CM

## 2017-06-08 DIAGNOSIS — G4761 Periodic limb movement disorder: Secondary | ICD-10-CM

## 2017-06-08 DIAGNOSIS — R0683 Snoring: Secondary | ICD-10-CM

## 2017-06-08 DIAGNOSIS — G4736 Sleep related hypoventilation in conditions classified elsewhere: Secondary | ICD-10-CM | POA: Diagnosis not present

## 2017-06-08 DIAGNOSIS — R5383 Other fatigue: Secondary | ICD-10-CM

## 2017-06-08 DIAGNOSIS — G4733 Obstructive sleep apnea (adult) (pediatric): Secondary | ICD-10-CM | POA: Diagnosis not present

## 2017-06-08 DIAGNOSIS — G4719 Other hypersomnia: Secondary | ICD-10-CM

## 2017-06-08 DIAGNOSIS — G4700 Insomnia, unspecified: Secondary | ICD-10-CM

## 2017-06-08 DIAGNOSIS — R400 Somnolence: Secondary | ICD-10-CM | POA: Diagnosis present

## 2017-06-15 ENCOUNTER — Encounter (HOSPITAL_BASED_OUTPATIENT_CLINIC_OR_DEPARTMENT_OTHER): Payer: Self-pay | Admitting: Cardiovascular Disease

## 2017-06-15 NOTE — Procedures (Signed)
Patient Name: Nicole Allen, Nicole Allen Date: 06/08/2017 Gender: Female D.O.B: 09-Sep-1950 Age (years): 54 Referring Provider: Shelva Majestic MD, ABSM Height (inches): 20 Interpreting Physician: Shelva Majestic MD, ABSM Weight (lbs): 106 RPSGT: Earney [place] BMI: 21 MRN: [id number] Neck Size: 12.50  CLINICAL INFORMATION Sleep Study Type: NPSG  Indication for sleep study: Fatigue, poor sleep, daytime sleepiness while driving  Epworth Sleepiness Score: 0  SLEEP STUDY TECHNIQUE As per the AASM Manual for the Scoring of Sleep and Associated Events v2.3 (April 2016) with a hypopnea requiring 4% desaturations.  The channels recorded and monitored were frontal, central and occipital EEG, electrooculogram (EOG), submentalis EMG (chin), nasal and oral airflow, thoracic and abdominal wall motion, anterior tibialis EMG, snore microphone, electrocardiogram, and pulse oximetry.  MEDICATIONS     amphetamine-dextroamphetamine (ADDERALL XR) 30 MG 24 hr capsule         aspirin 81 MG tablet         cetirizine (ZYRTEC) 10 MG tablet         eszopiclone (LUNESTA) 2 MG TABS tablet         fluticasone (FLONASE) 50 MCG/ACT nasal spray         GLUCOSAMINE-CHONDROITIN PO         mupirocin nasal ointment (BACTROBAN NASAL) 2 %         Omega-3 Fatty Acids (FISH OIL) 1000 MG CAPS         omeprazole (PRILOSEC) 40 MG capsule         OVER THE COUNTER MEDICATION         simvastatin (ZOCOR) 10 MG tablet         SYNTHROID 50 MCG tablet         TURMERIC PO      Medications self-administered by patient taken the night of the study : OMEPRAZOLE, SIMVASTATIN, LUNESTA, Q 10, FLONASE, BABY ASPIRIN  SLEEP ARCHITECTURE The study was initiated at 9:57:31 PM and ended at 4:26:58 AM.  Sleep onset time was 28.3 minutes and the sleep efficiency was 51.4%. The total sleep time was 200.2 minutes.  wake after sleep onset (WASO) was 161 minutes.  Stage REM latency was N/A minutes.  The patient spent 6.74% of the  night in stage N1 sleep, 93.26% in stage N2 sleep, 0.00% in stage N3 and 0.00% in REM.  Alpha intrusion was absent.  Supine sleep was 46.71%.  RESPIRATORY PARAMETERS The overall apnea/hypopnea index (AHI) was 5.4 per hour.  the respiratory disturbance index (RDI) was 6.9 per hour.  There were 4 total apneas, including 3 obstructive, 1 central and 0 mixed apneas. There were 14 hypopneas and 5 RERAs.  The AHI during Stage REM sleep was N/A per hour.  AHI while supine was 11.6 per hour.  The mean oxygen saturation was 95.78%. The minimum SpO2 during sleep was 88.00%.  Soft snoring was noted during this study.  CARDIAC DATA The 2 lead EKG demonstrated sinus rhythm. The mean heart rate was 67.85 beats per minute. Other EKG findings include: None.  LEG MOVEMENT DATA The total PLMS were 0 with a resulting PLMS index of 0.00. Associated arousal with leg movement index was 0.0 .  IMPRESSIONS - Mild obstructive sleep apnea occurred during this study (AHI 5.4/h; , RDI 6.9/h). Events were more frequent with supine sleep with an AHI of 11.6/h.  REM sleep was not achieved on the present study, and as result, the overall severity of the patient's sleep apnea may be underestimated. - No significant central sleep apnea  occurred during this study (CAI = 0.3/h). - Mild oxygen desaturation to a nadir of 88%. - The arousal index was mildly abnormal. - Abnormal sleep architecture with absence of slow-wave and REM sleep. - The patient snored with soft snoring volume. -  In contrast to the patient's prior evaluation of 02/24/2016, no cardiac abnormalities were noted during this study. - Clinically significant periodic limb movements did not occur during sleep. No significant associated arousals.  DIAGNOSIS - Obstructive Sleep Apnea (327.23 [G47.33 ICD-10]) - Nocturnal Hypoxemia (327.26 [G47.36 ICD-10])  RECOMMENDATIONS - Mild obstructive sleep apnea was demonstrated.  However, REM sleep was not  achieved and the severity of the patient's sleep disordered breathing may be underestimated.  - Efforts should be made to optimize nasal and oral pharyngeal patency. - Initially consider alternatives to CPAP therapy; however, if patient becomes more symptomatic consider re-evaluation or CPAP titration can be undertaken. - The patient should be counseled to avoid supine sleep; consider positional therapy - Avoid alcohol, sedatives and other CNS depressants that may worsen sleep apnea and disrupt normal sleep architecture. - Sleep hygiene should be reviewed to assess factors that may improve sleep quality. - Regular exercise should be initiated or continued if appropriate.  [Electronically signed] 06/15/2017 05:55 PM  Shelva Majestic MD, [facility], [place], American Board of Sleep Medicine   NPI: [id number] [place] PH: [phone]   FX: [phone] [place]

## 2017-06-17 ENCOUNTER — Ambulatory Visit: Payer: Medicare Other | Admitting: Cardiovascular Disease

## 2017-06-17 ENCOUNTER — Encounter: Payer: Self-pay | Admitting: Cardiovascular Disease

## 2017-06-17 VITALS — BP 114/60 | HR 70 | Ht 59.0 in | Wt 108.0 lb

## 2017-06-17 DIAGNOSIS — R0683 Snoring: Secondary | ICD-10-CM

## 2017-06-17 DIAGNOSIS — G4730 Sleep apnea, unspecified: Secondary | ICD-10-CM | POA: Diagnosis not present

## 2017-06-17 DIAGNOSIS — G4700 Insomnia, unspecified: Secondary | ICD-10-CM | POA: Diagnosis not present

## 2017-06-17 DIAGNOSIS — E039 Hypothyroidism, unspecified: Secondary | ICD-10-CM

## 2017-06-17 DIAGNOSIS — R5383 Other fatigue: Secondary | ICD-10-CM | POA: Diagnosis not present

## 2017-06-17 MED ORDER — ESZOPICLONE 3 MG PO TABS: 3.0000 mg | ORAL_TABLET | Freq: Every evening | ORAL | 2 refills | Status: DC | PRN

## 2017-06-17 NOTE — Progress Notes (Signed)
Ov 12/17 with Dr. Claiborne Billings to discuss

## 2017-06-17 NOTE — Progress Notes (Signed)
Reviewed with Dr. Claiborne Billings at [facility]

## 2017-06-17 NOTE — Patient Instructions (Signed)
Medication Instructions:  INCREASE Lunesta to 3mg  AS NEEDED at bedtime   Follow-Up: Your physician wants you to follow-up in: 6 months with Dr. Claiborne Billings. You will receive a reminder letter in the mail two months in advance. If you don't receive a letter, please call our office to schedule the follow-up appointment.   Any Other Special Instructions Will Be Listed Below (If Applicable).     If you need a refill on your cardiac medications before your next appointment, please call your pharmacy.

## 2017-06-18 ENCOUNTER — Encounter: Payer: Self-pay | Admitting: Cardiovascular Disease

## 2017-06-18 NOTE — Progress Notes (Signed)
Cardiology Office Note    Date:  06/18/2017   ID:  Nicole Allen, DOB 03/11/1951, MRN [id number]  PCP:  Rita Ohara, MD  Cardiologist:  Shelva Majestic, MD   Chief Complaint  Patient presents with  . Follow-up    discuss sleep study   Follow-up evaluation  History of Present Illness:  Nicole Allen is a 66 y.o. female who had undergone a sleep study interpreted by [name] in August 2017.  The patient  continued to have significant difficulty with sleep and is was referred by [name] for sleep evaluation and possible restless legs.  I saw her for initial evaluation on 04/16/2017.  She presents for follow-up evaluation.  Nicole Allen has a history of hypothyroidism, limb movements of sleep, attention deficit disorder, hyperlipidemia, and GERD.  She was having significant difficulty with sleep with some insomnia and ultimately was referred for sleep study in 02/24/2016 which was interpreted by Dr. Annamaria Boots.  I personally reviewed this sleep study in detail.  The patient had reduced sleep efficiency at 51.7%.  She had markedly prolonged rim latency at 266 minutes.  She also had significant reduction in rems sleep at only 4%.  Wake after sleep onset was 137 minutes.  She was found to have an overall AHI of 4.5 per hour.  Do not see any mention of the respiratory disturbance index, but I suspect this was increased based on at least 17 respiratory effort related arousals.  AHI while sleeping supine, was 11.7 and consistent with mild sleep apnea.  She had moderate snoring.  There was no significant oxygen desaturation.  She had slight increase in periodic limb movements of sleep at 51, resulting in index of 16.36.  Associated arousals with leg movement index was 5.8. When I initially saw her, she the patient denied any urge to move or painful restless legs , and her symptoms were not suggestive of restless leg syndrome.  Reportedly, she was told to have a trial of Mirapex by [name], which she  never did.  She had difficulty with sleep initiation and maintenance.  When I initially saw her she was having difficulty particularly with sleep initiation and maintenance.  She typically goes to bed between 10 and 11 PM at night, but may stay awake for several hours before she falls asleep.  However, if she stays up until 1 AM and tries to go to sleep then she typically falls asleep in about 30 minutes.  She often wakes up between 8:30 AM and 10 AM.  She notes frequent awakenings.  She was given a prescription for trazodone for sleep, which has not been very helpful.  She did not tolerate Ambien in the past.  She was complaining of being tired.  When the day and was at a point where she not feel safe driving toward the daytime since she may fall a sleep.   Review of her records reveals that 2017.  She had been seen by Dr. Dorris Carnes for evaluation of hyperlipidemia.  The calcium score was recommended but this was never followed up with.  When I saw her, I recommended a trial of Lunesta 2 mg, which would be helpful both for sleep initiation and maintenance.  She had issues with her insurance company covering this, but ultimately she instituted this therapy.  I referred her for follow-up sleep study which was done on 06/08/2017.  Unfortunately, this was the night of the snowstorm.  She did not have a good  experience in the lab, particularly with reference to the sleep tech.  She often sleeps with a sound machine and the tech would not let her use it as result, she was very anxious and again had difficulty with sleep initiation.  Of note, since starting the Lunesta at home, she states that this has been very effective 90% of the time and allowing her to fall sleep within 30 minutes and usually sleep duration and maintenance was significantly improved.  Unfortunately, the night of the sleep study because of difficulty with sleep initiation, a cold environment, and not feeling comfortable with tech sleep  efficiency was poor at 51.4%.  Wake after sleep onset was 161 minutes.  Her HAI was only minimally increased at 5.4 per hour with an RDI of 6.9 per hour.  There was a positional component with supine sleep, AHI of 11.6.  She was unable to achieve rems sleep.  In contrast, since she had been on Lunesta at home and was sleeping for longer duration.  She had noticed over the past month that she was dreaming.  Interestingly, she feels that since she is started [name].  She is not moving her legs were feeling this jittery sensation.  In contrast to her previous sleep study, on her most recent sleep study she did not have any periodic limb movement disorder of sleep.  She continues to take Aldoril X are for her ADD.  She is on omeprazole for GERD.  She takes simvastatin 10 mg at bedtime for hyperlipidemia.  She also is on levothyroxine 50 g.  She presents for evaluation.  An Epworth Sleepiness Scale score was recalculated in the office today and this endorsed at 0   Past Medical History:  Diagnosis Date  . ADD (attention deficit disorder)   . Allergy    SEASONAL  . BCC (basal cell carcinoma of skin)    chest; Dr. Tonia Brooms  . BCC (basal cell carcinoma)    Tonia Brooms  . Cervical spondylosis   . Hearing loss    high frequency (Dr. Prescott Parma); hearing aids recommended; bilateral  . History of kidney stones    x3  . Hypothyroidism 2015  . Insomnia    worse with menopause  . Kidney stone    x3, Dr. Reece Agar  . Osteopenia   . Postmenopausal    on HRT--sees GYN  . Pure hypercholesterolemia     Past Surgical History:  Procedure Laterality Date  . CATARACT EXTRACTION Bilateral L 10/18/15 R 12/13/15   Dr. Herbert Deaner  . CESAREAN SECTION  [id number]  . COLONOSCOPY  2003, 2013   Dr. Collene Mares  . HYMENECTOMY  1993  . KNEE SURGERY Right 09/29/2014   Dr. Jim Desanctis replacement/reconstruction  . TONSILLECTOMY AND ADENOIDECTOMY    . TUBAL LIGATION  1991    Current Medications: Outpatient Medications Prior to Visit    Medication Sig Dispense Refill  . amphetamine-dextroamphetamine (ADDERALL XR) 30 MG 24 hr capsule Take 1 capsule (30 mg total) every morning by mouth. 90 capsule 0  . aspirin 81 MG tablet Take 81 mg by mouth daily.    . cetirizine (ZYRTEC) 10 MG tablet Take 10 mg by mouth daily.    . fluticasone (FLONASE) 50 MCG/ACT nasal spray USE 2 SPRAYS IN EACH NOSTRIL ONCE A DAY. 16 g 2  . GLUCOSAMINE-CHONDROITIN PO Take 1 capsule by mouth once a week.     . mupirocin nasal ointment (BACTROBAN NASAL) 2 % Apply to the affected area of skin at left nostril twice daily  for 5-7 days. (Patient taking differently: as needed. Apply to the affected area of skin at left nostril twice daily for 5-7 days.) 10 g 0  . omeprazole (PRILOSEC) 40 MG capsule Take 1 capsule (40 mg total) by mouth daily. 90 capsule 1  . OVER THE COUNTER MEDICATION vagifilm    . simvastatin (ZOCOR) 10 MG tablet TAKE ONE TABLET AT BEDTIME. 90 tablet 0  . SYNTHROID 50 MCG tablet TAKE 1 TABLET ONCE DAILY BEFORE BREAKFAST. 90 tablet 1  . TURMERIC PO Take by mouth daily.    . eszopiclone (LUNESTA) 2 MG TABS tablet Take 1 tablet (2 mg total) by mouth at bedtime as needed for sleep. Take immediately before bedtime 30 tablet 2  . Omega-3 Fatty Acids (FISH OIL) 1000 MG CAPS Take 1 capsule by mouth once a week.      No facility-administered medications prior to visit.      Allergies:   Morphine and related and Codeine   Social History   Socioeconomic History  . Marital status: Married    Spouse name: None  . Number of children: 2  . Years of education: None  . Highest education level: None  Social Needs  . Financial resource strain: None  . Food insecurity - worry: None  . Food insecurity - inability: None  . Transportation needs - medical: None  . Transportation needs - non-medical: None  Occupational History  . Occupation: retired Teaching laboratory technician  Tobacco Use  . Smoking status: Never Smoker  . Smokeless tobacco: Never Used   Substance and Sexual Activity  . Alcohol use: Yes    Alcohol/week: 0.0 oz    Comment: 5-7/week (2 drinks on the weekends, occasional glass during the week once or twice).  . Drug use: No  . Sexual activity: Yes    Partners: Male  Other Topics Concern  . None  Social History Narrative   Lives with husband.  Son in [place], [place] in [place], Daughter is in [place]. Grandson (born 04/2015, in [place]).   Son with alcohol issues (and arrested for domestic violence summer/fall 2017)     Additional social history is notable that she was born in [place], [place].  She is a Engineer, water by trade.  She has ADD.  There is no tobacco history.  Family History:  The patient's family history includes ADD / ADHD in her daughter and son; Alzheimer's disease in her maternal grandmother; Arthritis in her mother; Asthma in her mother; Cancer in her maternal grandfather and paternal grandfather; Heart disease in her father; Hyperlipidemia in her father and mother; Hypertension in her father and mother; Osteoporosis in her mother.   Her mother died at 83, father died at age 10 and had suffered a myocardial infarction.  She has one sister with obstructive sleep apnea and her father also had obstructive sleep apnea.  ROS General: Negative; No fevers, chills, or night sweats;  HEENT: Negative; No changes in vision or hearing, sinus congestion, difficulty swallowing Pulmonary: Negative; No cough, wheezing, shortness of breath, hemoptysis Cardiovascular: Negative; No chest pain, presyncope, syncope, palpitations GI: Negative; No nausea, vomiting, diarrhea, or abdominal pain GU: Negative; No dysuria, hematuria, or difficulty voiding Musculoskeletal: Negative; no myalgias, joint pain, or weakness Hematologic/Oncology: Negative; no easy bruising, bleeding Endocrine: Negative; no heat/cold intolerance; no diabetes Neuro: Negative; no changes in balance, headaches Skin: Negative; No rashes or skin lesions Psychiatric:  Negative; No behavioral problems, depression Sleep: Positive for insomnia, snoring, fatigability, limited movement disorder of sleep, no hypnogognic hallucinations,  no cataplexy Other comprehensive 14 point system review is negative.   PHYSICAL EXAM:   VS:  BP 114/60   Pulse 70   Ht 4' 11"  (1.499 m)   Wt 108 lb (49 kg)   BMI 21.81 kg/m     Repeat blood pressure by me was 104/68  Wt Readings from Last 3 Encounters:  06/17/17 108 lb (49 kg)  06/08/17 106 lb (48.1 kg)  04/16/17 111 lb 6.4 oz (50.5 kg)    General: Alert, oriented, no distress.  Skin: normal turgor, no rashes, warm and dry HEENT: Normocephalic, atraumatic. Pupils equal round and reactive to light; sclera anicteric; extraocular muscles intact;  Nose without nasal septal hypertrophy Mouth/Parynx benign; Mallinpatti scale 2 Neck: No JVD, no carotid bruits; normal carotid upstroke Lungs: clear to ausculatation and percussion; no wheezing or rales Chest wall: without tenderness to palpitation Heart: PMI not displaced, RRR, s1 s2 normal, 1/6 systolic murmur, no diastolic murmur, no rubs, gallops, thrills, or heaves Abdomen: soft, nontender; no hepatosplenomehaly, BS+; abdominal aorta nontender and not dilated by palpation. Back: no CVA tenderness Pulses 2+ Musculoskeletal: full range of motion, normal strength, no joint deformities Extremities: no clubbing cyanosis or edema, Homan's sign negative  Neurologic: grossly nonfocal; Cranial nerves grossly wnl Psychologic: Normal mood with hyperactivity   Studies/Labs Reviewed:   EKG:  EKG is ordered today.  ECG (independently read by me): Normal sinus rhythm at 67 bpm.  No ectopy.  QTC interval 467 ms.  Recent Labs: BMP Latest Ref Rng & Units 07/05/2016 12/26/2015 04/11/2015  Glucose 65 - 99 mg/dL 92 91 92  BUN 7 - 25 mg/dL 15 - 15  Creatinine 0.50 - 0.99 mg/dL 0.63 - 0.62  Sodium 135 - 146 mmol/L 138 - 138  Potassium 3.5 - 5.3 mmol/L 4.1 - 4.3  Chloride 98 - 110 mmol/L  105 - 103  CO2 20 - 31 mmol/L 25 - 26  Calcium 8.6 - 10.4 mg/dL 8.7 - 9.0     Hepatic Function Latest Ref Rng & Units 04/10/2017 07/05/2016 12/26/2015  Total Protein 6.1 - 8.1 g/dL 6.6 6.1 6.4  Albumin 3.6 - 5.1 g/dL - 4.1 4.2  AST 10 - 35 U/L 19 18 19   ALT 6 - 29 U/L 11 12 15   Alk Phosphatase 33 - 130 U/L - 42 49  Total Bilirubin 0.2 - 1.2 mg/dL 0.4 0.6 0.5  Bilirubin, Direct 0.0 - 0.2 mg/dL 0.1 - 0.1    CBC Latest Ref Rng & Units 10/08/2016 07/05/2016 07/28/2015  WBC 4.0 - 10.5 K/uL 5.7 4.9 4.5  Hemoglobin 11.7 - 15.5 g/dL 13.4 11.9 12.5  Hematocrit 35.0 - 45.0 % 40.0 35.6 36.9  Platelets 140 - 400 K/uL 209 201 195   Lab Results  Component Value Date   MCV 85.3 10/08/2016   MCV 88.3 07/05/2016   MCV 85.8 07/28/2015   MCV 85.8 07/28/2015   Lab Results  Component Value Date   TSH 1.40 04/10/2017   No results found for: HGBA1C   BNP No results found for: BNP  ProBNP No results found for: PROBNP   Lipid Panel     Component Value Date/Time   CHOL 188 07/05/2016 0724   TRIG 107 07/05/2016 0724   HDL 91 07/05/2016 0724   CHOLHDL 2.1 07/05/2016 0724   VLDL 21 07/05/2016 0724   LDLCALC 76 07/05/2016 0724     RADIOLOGY: No results found.   Additional studies/ records that were reviewed today include:  I reviewed the patient's  sleep study in detail and compared to her prior diagnostic polysomnogram.    ASSESSMENT:    1. Sleep apnea, unspecified type   2. Snoring   3. Fatigue, unspecified type   4. Hypothyroidism, unspecified type   5. 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  She also is a history of hypothyroidism which has been stable on levothyroxine 50 g.  She has attention deficit disorder and takes adderall XR  30 mg every morning.  .  She has had issues with sleep initiation and sleep maintenance.   Previously she was found to have very mild sleep apnea, unspecified type.  On her initial sleep study.  She was found to have significant periodic limb movement disorder of sleep with a PLM asked index of 16.4.  She has felt improved with reference to her sleep initiation and maintenance.  Since initiating Lunesta, which I recommended at her initial office visit.  She now states that 90% of the time.  She has fullness sleep within 30 minutes and her sleep duration prior to awakening has improved.  He also has noticed that she has started to have dreams again suggestive of 10 and a rim sleep.  Unfortunately, on her most recent sleep study.  This was the night of the severe snowstorm that was starting during her stay.  The tach was very concerned about the weather and also initially did not allow the patient to use her machine.  This resulted in the patient being much more anxious and she could not sleep until later in the night.  Her sound machine was utilized.  Unfortunately, due to the reduced sleep efficiency.  She never achieved rems sleep.  Although her sleep study again suggests very mild sleep apnea, she did have a positional component with her AHI with supine sleep at 11.6 per hour.  Unfortunately, I do not know if her sleep apnea is more severe in REM sleep since this was not achieved.  Presently, I discussed positional therapy to avoid supine sleep.  I also question whether adderalll XR may contributing to a reduction in REM sleep.  Now that she is no longer working, I have suggested she seek follow-up evaluation and perhaps see if her dose can be reduced.  Presently, since she feels improved but still wakes up several times per night.  I will give her a trial of a 3 mg dose of Lunesta to compare to the 2 mg dosing with reference to sleep maintenance.  She does not have restless leg syndrome.  Interestingly, she had complete suppression of her previous periodic limb movement disorder of sleep with Lunesta.   I will see her back in 6 months for reevaluation.  Medication Adjustments/Labs and Tests Ordered: Current medicines are reviewed at length with the patient today.  Concerns regarding medicines are outlined above.  Medication changes, Labs and Tests ordered today are listed in the Patient Instructions below. Patient Instructions  Medication Instructions:  INCREASE Lunesta to 5m AS NEEDED at bedtime   Follow-Up: Your physician wants you to follow-up in: 6 months with Dr. KClaiborne Billings You will receive a reminder letter in the mail two months in advance. If you don't receive a letter, please call our office to schedule the follow-up appointment.   Any Other Special Instructions Will Be Listed Below (If Applicable).  If you need a refill on your cardiac medications before your next appointment, please call your pharmacy.      Signed, Shelva Majestic, MD  06/18/2017 8:25 AM    [facility] [address], [place], [place], [place]  [address] Phone: [phone]

## 2017-08-13 ENCOUNTER — Other Ambulatory Visit: Payer: Self-pay | Admitting: Family Medicine

## 2017-08-13 DIAGNOSIS — E7800 Pure hypercholesterolemia, unspecified: Secondary | ICD-10-CM

## 2017-08-13 NOTE — Telephone Encounter (Signed)
Pt has an appt 3/18

## 2017-08-22 ENCOUNTER — Telehealth: Payer: Self-pay | Admitting: Family Medicine

## 2017-08-22 DIAGNOSIS — F988 Other specified behavioral and emotional disorders with onset usually occurring in childhood and adolescence: Secondary | ICD-10-CM

## 2017-08-22 MED ORDER — AMPHETAMINE-DEXTROAMPHET ER 30 MG PO CP24: 30.0000 mg | ORAL_CAPSULE | ORAL | 0 refills | Status: DC

## 2017-08-22 NOTE — Telephone Encounter (Signed)
I did send this electronically, but also printed it by accident. Please advise pt it was sent to pharmacy, and shred the printed once.  Thanks

## 2017-08-22 NOTE — Telephone Encounter (Signed)
Patient advised.

## 2017-08-22 NOTE — Telephone Encounter (Signed)
Pt called and requesting a refill on her adderall pt uses [facility] [place] Pt can be reached at [phone]

## 2017-09-11 ENCOUNTER — Other Ambulatory Visit: Payer: Self-pay

## 2017-09-11 ENCOUNTER — Other Ambulatory Visit: Payer: Self-pay | Admitting: Family Medicine

## 2017-09-11 ENCOUNTER — Other Ambulatory Visit: Payer: Self-pay | Admitting: Cardiovascular Disease

## 2017-09-11 DIAGNOSIS — E7800 Pure hypercholesterolemia, unspecified: Secondary | ICD-10-CM

## 2017-09-12 ENCOUNTER — Other Ambulatory Visit: Payer: Medicare Other

## 2017-09-12 DIAGNOSIS — E7800 Pure hypercholesterolemia, unspecified: Secondary | ICD-10-CM

## 2017-09-12 DIAGNOSIS — E039 Hypothyroidism, unspecified: Secondary | ICD-10-CM

## 2017-09-12 DIAGNOSIS — Z5181 Encounter for therapeutic drug level monitoring: Secondary | ICD-10-CM

## 2017-09-12 NOTE — Addendum Note (Signed)
Addended by: Tyrone Apple on: 09/12/2017 08:34 AM   Modules accepted: Orders

## 2017-09-13 LAB — CBC WITH DIFFERENTIAL/PLATELET
Basophils Absolute: 0.1 10*3/uL (ref 0.0–0.2)
Basos: 1 %
EOS (ABSOLUTE): 0.1 10*3/uL (ref 0.0–0.4)
EOS: 2 %
HEMATOCRIT: 37.3 % (ref 34.0–46.6)
HEMOGLOBIN: 12.7 g/dL (ref 11.1–15.9)
IMMATURE GRANULOCYTES: 0 %
Immature Grans (Abs): 0 10*3/uL (ref 0.0–0.1)
LYMPHS ABS: 2.1 10*3/uL (ref 0.7–3.1)
Lymphs: 37 %
MCH: 29.5 pg (ref 26.6–33.0)
MCHC: 34 g/dL (ref 31.5–35.7)
MCV: 87 fL (ref 79–97)
MONOCYTES: 9 %
Monocytes Absolute: 0.5 10*3/uL (ref 0.1–0.9)
NEUTROS PCT: 51 %
Neutrophils Absolute: 3 10*3/uL (ref 1.4–7.0)
Platelets: 204 10*3/uL (ref 150–379)
RBC: 4.31 x10E6/uL (ref 3.77–5.28)
RDW: 14 % (ref 12.3–15.4)
WBC: 5.8 10*3/uL (ref 3.4–10.8)

## 2017-09-13 LAB — COMPREHENSIVE METABOLIC PANEL
A/G RATIO: 1.8 (ref 1.2–2.2)
ALBUMIN: 4.2 g/dL (ref 3.6–4.8)
ALK PHOS: 60 IU/L (ref 39–117)
ALT: 14 IU/L (ref 0–32)
AST: 18 IU/L (ref 0–40)
BILIRUBIN TOTAL: 0.2 mg/dL (ref 0.0–1.2)
BUN / CREAT RATIO: 24 (ref 12–28)
BUN: 16 mg/dL (ref 8–27)
CO2: 27 mmol/L (ref 20–29)
CREATININE: 0.67 mg/dL (ref 0.57–1.00)
Calcium: 9.3 mg/dL (ref 8.7–10.3)
Chloride: 101 mmol/L (ref 96–106)
GFR calc Af Amer: 105 mL/min/{1.73_m2} (ref >59)
GFR calc non Af Amer: 91 mL/min/{1.73_m2} (ref >59)
GLOBULIN, TOTAL: 2.4 g/dL (ref 1.5–4.5)
Glucose: 87 mg/dL (ref 65–99)
POTASSIUM: 4.4 mmol/L (ref 3.5–5.2)
SODIUM: 140 mmol/L (ref 134–144)
Total Protein: 6.6 g/dL (ref 6.0–8.5)

## 2017-09-13 LAB — LIPID PANEL
CHOL/HDL RATIO: 2.7 ratio (ref 0.0–4.4)
Cholesterol, Total: 210 mg/dL — ABNORMAL HIGH (ref 100–199)
HDL: 79 mg/dL (ref >39)
LDL CALC: 108 mg/dL — AB (ref 0–99)
TRIGLYCERIDES: 115 mg/dL (ref 0–149)
VLDL Cholesterol Cal: 23 mg/dL (ref 5–40)

## 2017-09-13 LAB — MAGNESIUM: Magnesium: 2.1 mg/dL (ref 1.6–2.3)

## 2017-09-13 LAB — TSH: TSH: 3.38 u[IU]/mL (ref 0.450–4.500)

## 2017-09-15 NOTE — Progress Notes (Signed)
Chief Complaint  Patient presents with  . Medicare Wellness    nonfasting AWV/CPE, no GYN exam sees Dr. Pamala Hurry and is UTD. See Dr. Marica Otter for eye exams and prefers to do there. Feels like she has never recovered from last year. Never feels like she wakes up rested. Effects her memory, anxiety and depression. 80% of her days she doesn't feel like herself. Thinks she needs to see Endocrinology. And wants to see someone for her sleep issues.     Nicole Allen is a 67 y.o. female who presents for annual physical, Medicare wellness visit and follow-up on chronic medical conditions.  She has the following concerns:  Hypothyroidism:  In January 2018 her Synthroid dose was increased from 25 to 9mcg daily. f/u TSH was okay in April. She remains on 3mcg.  Denies changes to hair/skin/bowels.   +fatigue "I just don't feel good". She had labs prior to visit--see all current results below exam.       Lab Results  Component Value Date   TSH 0.62 10/08/2016   GERD: She was changed from famotidine to omeprazole 40mg  when having a flare in January 2018. This was working very well for her.  She tried prilosec OTC over the last 2 weeks--increased nausea, need for Tums. Feels better being back on the prescription.  She has been seeing Dr. Claiborne Billings, had sleep consult and repeat sleep study.  He started her on Lunesta, due to trouble initiating sleep At her last visit with him in December, she reported that [name] had been very effective 90% of the time, allowing her to fall sleep within 30 minutes and usually sleep duration and maintenance was significantly improved.   Circumstances didn't allow for a good sleep study (difficulty with sleep initiation, a cold environment, and not feeling comfortable with tech), sleep efficiency was poor at 51.4%.  AHI was only minimally increased at 5.4 per hour with an RDI of 6.9 per hour; positional component with supine sleep, AHI of 11.6.  She was unable to achieve  REM sleep. Since she had been on Lunesta at home, sleeping for longer duration and actually has been dreaming, indicating REM sleep. Unknown if sleep apnea is more severe in REM sleep since this was not achieved during the study.  Dr. Claiborne Billings discussed positional therapy, to avoid supine sleep. He also wondered if Adderalll XR may contributing to a reduction in REM sleep.  Now that she is no longer working, Dr. Claiborne Billings suggested she seek follow-up evaluation and perhaps see if her dose can be reduced. At her last visit with him, when taking 2mg  Lunesta, she felt improved, but still wakes up several times per night. He gave her a trial of increased Lunesta dose to 3 mg.  Denies restless leg symptoms.  In contrast to her previous sleep study, she did not have any periodic limb movement disorder of sleep noted on most recent study.  She is now taking lunesta 3mg . Some days she still can't fall asleep.  When she does, she usually stays asleep, or gets back to sleep quickly . She still doesn't feel rested in the morning, still feels exhausted. Has f/u in June. She needs refill on her Lunesta--their office hasn't been responding to the pharmacy requests.  ADD: She continues to take 30mg  of Adderall. Deniesside effects. Last filled #90 in 08/22/2017.  She is willing to try the lower dose (as suggested by Dr. Claiborne Billings), though she reports having a lot of trouble with her memory and  concentration.  Hyperlipidemia: She was tried off simvastatin in the past(she was only on very low dose, tried without--still high, so then we tried treating mild hypothyroidism, but lipids didn't change). Went back on 10mg  of simvastatin (given family h/o CAD). Tolerating it fine without any side effects. Some reports some foot cramps at night. No myalgias. She saw Dr. Dorris Carnes in October 2017 for consult, who sent her for Ca score CT, which showed Ca score of 0, no evidence of calcified plaque. Suggested cutting back to dose of 5mg  of  simvastatin can consider stopping. She didn't feel like there was good communication with her; she preferred to stay on the 10mg  dose.  Osteopenia:  She had f/u DEXA in January, 2018. Her GYN wanted her to start on alendronate., She doesn't take calcium (worries about her heart), doesn't drink milk or get much dairy in her diet. She is having dental procedures done, and worries about the alendronate. Plans to discuss with her GYN at next visit.  Immunization History  Administered Date(s) Administered  . Influenza Split 04/23/2011  . Influenza, High Dose Seasonal PF 07/11/2016, 04/10/2017  . Influenza, Seasonal, Injecte, Preservative Fre 06/09/2012  . Influenza,inj,Quad PF,6+ Mos 04/22/2014, 04/14/2015  . Pneumococcal Conjugate-13 07/11/2016  . Tdap 12/26/2005  . Zoster 05/14/2011  Last Pap smear: UTD through GYN, sees Dr. Pamala Hurry in the summer  Last mammogram: 07/2016 Last colonoscopy: 12/2011, small polyp, due again in 5 years (11/2016, didn't have) Last DEXA: DEXA 07/2016 T-2.1 L fem neck (ordered by GYN). Ophtho: yearly  Dentist: every 6 months  Exercise: not currently getting any exercise (due to feeling bad, feeling tired). Vitamin D was 36 in 09/2016  Other doctors caring for patient include: GYN: Dr. Valentino Saxon Ophtho: Dr. Herbert Deaner (and Dr. Marica Otter as Optometrist) GI: Dr. Collene Mares Dermatologist: Dr. Delman Cheadle Sleep doctor: Dr. Claiborne Billings Ortho: Dr. Noemi Chapel Cardiology: previously saw Dr. Harrington Challenger, more recently Dr. Claiborne Billings (for sleep eval)   Depression screen: PHQ-2 score of 4 PHQ-9 score of 10 Fall screen: negative Functional Status screen signficant only for known chronic hearing loss, known ADHD. Memory concerns and fatigue. See full questionnaires in epic. Mini-cog screening normal score of 5  End of Life Discussion:  Patient has a living will and medical power of attorney. She was asked to get copies to [place].  Past Medical History:  Diagnosis Date  . ADD (attention deficit  disorder)   . Allergy    SEASONAL  . BCC (basal cell carcinoma of skin)    chest; Dr. Tonia Brooms  . BCC (basal cell carcinoma)    Tonia Brooms  . Cervical spondylosis   . Hearing loss    high frequency (Dr. Prescott Parma); hearing aids recommended; bilateral  . History of kidney stones    x3  . Hypothyroidism 2015  . Insomnia    worse with menopause  . Kidney stone    x3, Dr. Reece Agar  . Osteopenia   . Postmenopausal    on HRT--sees GYN  . Pure hypercholesterolemia     Past Surgical History:  Procedure Laterality Date  . CATARACT EXTRACTION Bilateral L 10/18/15 R 12/13/15   Dr. Herbert Deaner  . CESAREAN SECTION  [id number]  . COLONOSCOPY  2003, 2013   Dr. Collene Mares  . HYMENECTOMY  1993  . KNEE SURGERY Right 09/29/2014   Dr. Jim Desanctis replacement/reconstruction  . TONSILLECTOMY AND ADENOIDECTOMY    . TUBAL LIGATION  1991    Social History   Socioeconomic History  . Marital status: Married  Spouse name: Not on file  . Number of children: 2  . Years of education: Not on file  . Highest education level: Not on file  Social Needs  . Financial resource strain: Not on file  . Food insecurity - worry: Not on file  . Food insecurity - inability: Not on file  . Transportation needs - medical: Not on file  . Transportation needs - non-medical: Not on file  Occupational History  . Occupation: retired Teaching laboratory technician  Tobacco Use  . Smoking status: Never Smoker  . Smokeless tobacco: Never Used  Substance and Sexual Activity  . Alcohol use: Yes    Alcohol/week: 0.0 oz    Comment: 5-7/week (2 drinks on the weekends, occasional glass during the week once or twice).  . Drug use: No  . Sexual activity: Yes    Partners: Male  Other Topics Concern  . Not on file  Social History Narrative   Lives with husband.  Son in [place], [place] in [place], Daughter is in [place]. Grandson (born 04/2015, in [place]).   Son with alcohol issues (and arrested for domestic violence summer/fall 2017)    Family History   Problem Relation Age of Onset  . Asthma Mother   . Arthritis Mother   . Hypertension Mother   . Hyperlipidemia Mother   . Osteoporosis Mother   . Hypertension Father   . Heart disease Father        23's  . Hyperlipidemia Father   . ADD / ADHD Daughter   . ADD / ADHD Son   . Sleep apnea Sister   . Alzheimer's disease Maternal Grandmother   . Cancer Maternal Grandfather        lung  . Cancer Paternal Grandfather        lung    Outpatient Encounter Medications as of 09/16/2017  Medication Sig Note  . aspirin 81 MG tablet Take 81 mg by mouth daily. 04/14/2015: .   . cetirizine (ZYRTEC) 10 MG tablet Take 10 mg by mouth daily.   . Eszopiclone 3 MG TABS Take 1 tablet (3 mg total) by mouth at bedtime as needed. Take immediately before bedtime   . fluticasone (FLONASE) 50 MCG/ACT nasal spray USE 2 SPRAYS IN EACH NOSTRIL ONCE A DAY.   Marland Kitchen omeprazole (PRILOSEC) 40 MG capsule Take 1 capsule (40 mg total) by mouth daily.   Marland Kitchen OVER THE COUNTER MEDICATION vagifilm   . simvastatin (ZOCOR) 10 MG tablet Take 1 tablet (10 mg total) by mouth at bedtime.   Marland Kitchen SYNTHROID 50 MCG tablet TAKE 1 TABLET ONCE DAILY BEFORE BREAKFAST.   Marland Kitchen TURMERIC PO Take by mouth daily.   . [DISCONTINUED] amphetamine-dextroamphetamine (ADDERALL XR) 30 MG 24 hr capsule Take 1 capsule (30 mg total) by mouth every morning.   . [DISCONTINUED] omeprazole (PRILOSEC) 40 MG capsule Take 1 capsule (40 mg total) by mouth daily.   . [DISCONTINUED] simvastatin (ZOCOR) 10 MG tablet TAKE ONE TABLET AT BEDTIME.   . [DISCONTINUED] SYNTHROID 50 MCG tablet TAKE 1 TABLET ONCE DAILY BEFORE BREAKFAST.   Marland Kitchen amphetamine-dextroamphetamine (ADDERALL XR) 20 MG 24 hr capsule Take 1 capsule (20 mg total) by mouth every morning.   . mupirocin nasal ointment (BACTROBAN NASAL) 2 % Apply to the affected area of skin at left nostril twice daily for 5-7 days. (Patient not taking: Reported on 09/16/2017)   . [DISCONTINUED] eszopiclone 3 MG TABS Take 1 tablet (3 mg  total) by mouth at bedtime as needed for sleep. Take  immediately before bedtime   . [DISCONTINUED] GLUCOSAMINE-CHONDROITIN PO Take 1 capsule by mouth once a week.    . [DISCONTINUED] Omega-3 Fatty Acids (FISH OIL) 1000 MG CAPS Take 1 capsule by mouth once a week.     No facility-administered encounter medications on file as of 09/16/2017.    (was taking 30mg  of adderall prior to today's visit; 20mg  rx'd today)  Allergies  Allergen Reactions  . Morphine And Related Other (See Comments)    Family history.  . Codeine Itching and Rash     ROS: The patient denies fever, weight changes, headaches, vision changes, ear pain, sore throat, breast concerns, chest pain, palpitations, dizziness, syncope, dyspnea on exertion, cough, swelling, nausea (resolved when restarted prescription PPI), vomiting, diarrhea, constipation, abdominal pain, melena, hematochezia, hematuria, incontinence, dysuria, vaginal bleeding, discharge, odor or itch, genital lesions, joint pains (other than her knee), numbness, tingling, weakness, tremor, suspicious skin lesions, abnormal bleeding/bruising, or enlarged lymph nodes.  +bilateral hearing loss, unchanged Bilateral knee pain, doesn't limit activities, but hasn't been active. Plans to f/u with her ortho (planning for hiking vacation in [place]) Reflux is well controlled with 40mg  omeprazole.   PHYSICAL EXAM:  BP 140/84   Pulse 84   Ht 4\' 11"  (1.499 m)   Wt 110 lb 12.8 oz (50.3 kg)   BMI 22.38 kg/m    124/72 on repeat by MD (pt still very upset, as this is higher than her normal, "very abnormal" for her  Wt Readings from Last 3 Encounters:  09/16/17 110 lb 12.8 oz (50.3 kg)  06/17/17 108 lb (49 kg)  06/08/17 106 lb (48.1 kg)    General Appearance:  Alert, cooperative though somewhat agitated/frustrated; she is in no distress, appears stated age   Head:  Normocephalic, without obvious abnormality, atraumatic   Eyes:  PERRL, conjunctiva/corneas clear,  EOM's intact, fundi benign   Ears:  Normal TM's and external ear canals   Nose:  Nares normal, mucosa mildly edematous, no drainage or sinus tenderness   Throat:  Lips, mucosa, and tongue normal; teeth and gums normal. Mild erythema posteriorly (cobblestoning)  Neck:  Supple, no lymphadenopathy; thyroid: no enlargement/tenderness/nodules; no carotid  bruit or JVD   Back:  Spine nontender, no curvature, ROM normal, no CVA tenderness   Lungs:  Clear to auscultation bilaterally without wheezes, rales or ronchi; respirations unlabored   Chest Wall:  No tenderness or deformity   Heart:  Regular rate and rhythm, S1 and S2 normal, no murmur, rub or gallop   Breast Exam:  Deferred to GYN   Abdomen:  Soft, non-tender, nondistended, normoactive bowel sounds, no masses,  no hepatosplenomegaly   Genitalia:  Deferred to GYN      Extremities:  No clubbing, cyanosis or edema   Pulses:  2+ and symmetric all extremities   Skin:  Skin color, texture, turgor normal. No rash or suspicious lesions  Lymph nodes:  Cervical, supraclavicular, and axillary nodes normal   Neurologic:  CNII-XII intact, normal strength, sensation and gait; reflexes 2+ and symmetric throughout        Psych:  MIldly depressed mood, affect is somewhat flat, agitated/irritable/frustrated today. Not smiling/laughing at all during visit.  Normal hygiene and grooming. Normal eye contact and speech.  Labs done prior to visit: Lab Results  Component Value Date   TSH 3.380 09/12/2017   Lab Results  Component Value Date   CHOL 210 (H) 09/12/2017   HDL 79 09/12/2017   LDLCALC 108 (H) 09/12/2017   TRIG 115  09/12/2017   CHOLHDL 2.7 09/12/2017     Chemistry      Component Value Date/Time   NA 140 09/12/2017 0849   K 4.4 09/12/2017 0849   CL 101 09/12/2017 0849   CO2 27 09/12/2017 0849   BUN 16 09/12/2017 0849   CREATININE 0.67 09/12/2017 0849   CREATININE 0.63  07/05/2016 0724      Component Value Date/Time   CALCIUM 9.3 09/12/2017 0849   ALKPHOS 60 09/12/2017 0849   AST 18 09/12/2017 0849   ALT 14 09/12/2017 0849   BILITOT 0.2 09/12/2017 0849     Fasting glucose 87 Mg 2.1  Lab Results  Component Value Date   WBC 5.8 09/12/2017   HGB 12.7 09/12/2017   HCT 37.3 09/12/2017   MCV 87 09/12/2017   PLT 204 09/12/2017     ASSESSMENT/PLAN:  Annual physical exam - Plan: POCT Urinalysis DIP (Proadvantage Device)  Medicare annual wellness visit, initial  Insomnia, unspecified type - improved with Lunesta (Dr. Claiborne Billings did r/f today); but still feels unrefreshed and tired. May need repeat sleep study, f/u with Dr. Claiborne Billings  Pure hypercholesterolemia - well controlled with low dose statin; can consider to decrease to 5mg  (not done today, not rocking the boat with many changes) - Plan: simvastatin (ZOCOR) 10 MG tablet  Gastroesophageal reflux disease, esophagitis presence not specified - didnt tolerate OTC doses, doing well on rx; continue. Discussed recommended vitamins and need for weight-bearing exercise - Plan: omeprazole (PRILOSEC) 40 MG capsule  Hypothyroidism, unspecified type - adequately replaced (though TSH much higher than on last check at same dose). Refer to endo per pt request--concerned about "all hormones" - Plan: SYNTHROID 50 MCG tablet, Ambulatory referral to Endocrinology  Osteopenia of necks of both femurs - bisphosphonate not likely a good option if planning dental surgeries. disc Ca, D, wt-bearing exercise. May be candidate for Prolia when is osteoporosis  Attention deficit disorder, unspecified hyperactivity presence - Plan: amphetamine-dextroamphetamine (ADDERALL XR) 20 MG 24 hr capsule  History of colon polyps  Immunization due - counseled re: risks/SE of pneumovax, Shingrix and TdaP. gave cost of TdaP if given here, will look into getting at pharmacy (as prev recommended, past due) - Plan: Pneumococcal polysaccharide vaccine  23-valent greater than or equal to 2yo subcutaneous/IM  Fatigue, unspecified type - suspect related to OSA, depression, possibly allergies. She can discuss her "hormone" concerns also with her GYN. Encouraged exercise  - Plan: Ambulatory referral to Endocrinology  Depression, major, single episode, moderate ([place]) - encouraged psych eval, given issues with insomnia, sleep, ADD and depression. Related to her fatigue and sleep (which comes first??)  Given Dr. Starleen Arms name, vs checking with her insurance. Pneumovax today. Past due for Tetanus booster--has 2 young grandchildren, and prefers Tdap over Td. At first stated would be willing to pay, due to her not remembering to get it at pharmacy despite being given written rx's in the past (at least once or twice).  When given cost, she preferred to check elsewhere first.  Referred to Dr. Chalmers Cater  Discussed monthly self breast exams and yearly mammograms; at least 30 minutes of aerobic activity at least 5 days/week, weight-bearing exercise 2x/wk; proper sunscreen use reviewed; healthy diet, including goals of calcium and vitamin D intake and alcohol recommendations (less than or equal to 1 drink/day) reviewed; regular seatbelt use; changing batteries in smoke detectors. Immunization recommendations discussed.Td past due--(Previously given written rx to get at pharmacy). Shingrix recommended risks/side effects reviewed. Colonoscopy recommendations reviewed--was due 11/2016. (path  report not received, told 5 yr f/u?? Contact Dr. Lorie Apley office)  F/u 6 mos. No labs prior needed (can do nonfasting labs at visit, if needed, but TSH likely going to be under Dr. Almetta Lovely care.  Total visit time 1 hour (all face to face, with significant counseling)  Pt encouraged to get [place] copies of her Living Will and [place]. She is Full Code/Care.  Follow up with GYN regarding your bone density.  Since you are having ongong dental procedures, likely the  bisphosphonates (ie alendronate) shouldn't be started.  You don't qualify yet for the Prolia injections (have to have osteoporosis, not just osteopenia).  You will be due for another bone density test in 07/2018.  I do think that following up with Dr. Claiborne Billings is a good idea--if you are still having unrefreshed sleep, you may have more significant sleep apnea than found on your last study (when you didn't get any REM sleep, like you are now).   I recommend you follow up with Dr. Valentino Saxon regarding your menopausal symptoms, hormonal questions.  Gynecologists are usually more specialized in treating hormonal issues; the endocrinologists can treat/evaluate other hormonal issues, including your thyroid.    Medicare Attestation I have personally reviewed: The patient's medical and social history Their use of alcohol, tobacco or illicit drugs Their current medications and supplements The patient's functional ability including ADLs,fall risks, home safety risks, cognitive, and hearing and visual impairment Diet and physical activities Evidence for depression or mood disorders  The patient's weight, height and BMI have been recorded in the chart.  I have made referrals, counseling, and provided education to the patient based on review of the above and I have provided the patient with a written personalized care plan for preventive services.

## 2017-09-16 ENCOUNTER — Encounter: Payer: Self-pay | Admitting: Family Medicine

## 2017-09-16 ENCOUNTER — Other Ambulatory Visit: Payer: Self-pay | Admitting: *Deleted

## 2017-09-16 ENCOUNTER — Ambulatory Visit: Payer: Medicare Other | Admitting: Family Medicine

## 2017-09-16 ENCOUNTER — Other Ambulatory Visit: Payer: Self-pay | Admitting: Cardiovascular Disease

## 2017-09-16 VITALS — BP 124/72 | HR 84 | Ht 59.0 in | Wt 110.8 lb

## 2017-09-16 DIAGNOSIS — M85852 Other specified disorders of bone density and structure, left thigh: Secondary | ICD-10-CM

## 2017-09-16 DIAGNOSIS — M85851 Other specified disorders of bone density and structure, right thigh: Secondary | ICD-10-CM | POA: Diagnosis not present

## 2017-09-16 DIAGNOSIS — Z23 Encounter for immunization: Secondary | ICD-10-CM

## 2017-09-16 DIAGNOSIS — Z86010 Personal history of colonic polyps: Secondary | ICD-10-CM | POA: Diagnosis not present

## 2017-09-16 DIAGNOSIS — F988 Other specified behavioral and emotional disorders with onset usually occurring in childhood and adolescence: Secondary | ICD-10-CM | POA: Diagnosis not present

## 2017-09-16 DIAGNOSIS — E039 Hypothyroidism, unspecified: Secondary | ICD-10-CM

## 2017-09-16 DIAGNOSIS — F321 Major depressive disorder, single episode, moderate: Secondary | ICD-10-CM | POA: Diagnosis not present

## 2017-09-16 DIAGNOSIS — E7800 Pure hypercholesterolemia, unspecified: Secondary | ICD-10-CM

## 2017-09-16 DIAGNOSIS — K219 Gastro-esophageal reflux disease without esophagitis: Secondary | ICD-10-CM

## 2017-09-16 DIAGNOSIS — G4700 Insomnia, unspecified: Secondary | ICD-10-CM | POA: Diagnosis not present

## 2017-09-16 DIAGNOSIS — Z0000 Encounter for general adult medical examination without abnormal findings: Secondary | ICD-10-CM | POA: Diagnosis not present

## 2017-09-16 DIAGNOSIS — R5383 Other fatigue: Secondary | ICD-10-CM

## 2017-09-16 LAB — POCT URINALYSIS DIP (PROADVANTAGE DEVICE)
Bilirubin, UA: NEGATIVE
GLUCOSE UA: NEGATIVE mg/dL
Ketones, POC UA: NEGATIVE mg/dL
Leukocytes, UA: NEGATIVE
NITRITE UA: NEGATIVE
Protein Ur, POC: NEGATIVE mg/dL
Specific Gravity, Urine: 1.015
UUROB: NEGATIVE
pH, UA: 6 (ref 5.0–8.0)

## 2017-09-16 MED ORDER — OMEPRAZOLE 40 MG PO CPDR: 40.0000 mg | DELAYED_RELEASE_CAPSULE | Freq: Every day | ORAL | 1 refills | Status: DC

## 2017-09-16 MED ORDER — AMPHETAMINE-DEXTROAMPHET ER 20 MG PO CP24: 20.0000 mg | ORAL_CAPSULE | ORAL | 0 refills | Status: DC

## 2017-09-16 MED ORDER — ESZOPICLONE 3 MG PO TABS: 3.0000 mg | ORAL_TABLET | Freq: Every evening | ORAL | 0 refills | Status: DC | PRN

## 2017-09-16 MED ORDER — SIMVASTATIN 10 MG PO TABS: 10.0000 mg | ORAL_TABLET | Freq: Every day | ORAL | 3 refills | Status: DC

## 2017-09-16 MED ORDER — SYNTHROID 50 MCG PO TABS: ORAL_TABLET | ORAL | 1 refills | Status: DC

## 2017-09-16 NOTE — Telephone Encounter (Signed)
Pt calling requesting a refill on lunesta (eszopiclone) 3 mg tablet. Pt stated that she need this medication sent to he pharmacy as soon as possible. Pt is leaving out of town tomorrow and need to pick this medication up today. Please address

## 2017-09-16 NOTE — Patient Instructions (Addendum)
HEALTH MAINTENANCE RECOMMENDATIONS:  It is recommended that you get at least 30 minutes of aerobic exercise at least 5 days/week (for weight loss, you may need as much as 60-90 minutes). This can be any activity that gets your heart rate up. This can be divided in 10-15 minute intervals if needed, but try and build up your endurance at least once a week.  Weight bearing exercise is also recommended twice weekly.  Eat a healthy diet with lots of vegetables, fruits and fiber.  "Colorful" foods have a lot of vitamins (ie green vegetables, tomatoes, red peppers, etc).  Limit sweet tea, regular sodas and alcoholic beverages, all of which has a lot of calories and sugar.  Up to 1 alcoholic drink daily may be beneficial for women (unless trying to lose weight, watch sugars).  Drink a lot of water.  Calcium recommendations are 1200-1500 mg daily (1500 mg for postmenopausal women or women without ovaries), and vitamin D 1000 IU daily.  This should be obtained from diet and/or supplements (vitamins), and calcium should not be taken all at once, but in divided doses.  Monthly self breast exams and yearly mammograms for women over the age of 73 is recommended.  Sunscreen of at least SPF 30 should be used on all sun-exposed parts of the skin when outside between the hours of 10 am and 4 pm (not just when at beach or pool, but even with exercise, golf, tennis, and yard work!)  Use a sunscreen that says "broad spectrum" so it covers both UVA and UVB rays, and make sure to reapply every 1-2 hours.  Remember to change the batteries in your smoke detectors when changing your clock times in the spring and fall.  Use your seat belt every time you are in a car, and please drive safely and not be distracted with cell phones and texting while driving.   Nicole Allen , Thank you for taking time to come for your Medicare Wellness Visit. I appreciate your ongoing commitment to your health goals. Please review the following  plan we discussed and let me know if I can assist you in the future.   These are the goals we discussed: Goals    None      This is a list of the screening recommended for you and due dates:  Health Maintenance  Topic Date Due  . Tetanus Vaccine  12/27/2015  . Pneumonia vaccines (2 of 2 - PPSV23) 07/11/2017  . Mammogram  08/01/2018  . Colon Cancer Screening  12/18/2021  . Flu Shot  Completed  . DEXA scan (bone density measurement)  Completed  .  Hepatitis C: One time screening is recommended by Center for Disease Control  (CDC) for  adults born from [birth year] through 1965.   Completed   Continue yearly mammograms (not 2020 date as above). We are giving you your second pneumonia vaccine today.  Please schedule your colonoscopy with Dr. Collene Mares.  Follow up with GYN regarding your bone density.  Since you are having ongong dental procedures, likely the bisphosphonates (ie alendronate) shouldn't be started.  You don't qualify yet for the Prolia injections (have to have osteoporosis, not just osteopenia).  You will be due for another bone density test in 07/2018.  I do think that following up with Dr. Claiborne Billings is a good idea--if you are still having unrefreshed sleep, you may have more significant sleep apnea than found on your last study (when you didn't get any REM sleep, like you are  now).  We are going to refer you to Dr. Chalmers Cater (endocrinology).   I recommend you follow up with Dr. Valentino Saxon regarding your menopausal symptoms, hormonal questions.  Gynecologists are usually more specialized in treating hormonal issues; the endocrinologists can treat/evaluate other hormonal issues, including your thyroid.   I recommend getting the new shingles vaccine (Shingrix). You will need to check with your insurance to see if it is covered, and if covered by Medicare Part D, you need to get from the pharmacy rather than our office.  It is a series of 2 injections, spaced 2 months apart.  Tetanus (TdaP, with  the pertussis) is recommended, given your exposure to new babies.  We are checking on the price so you can decide if you are going to get it today, vs get it from the pharmacy (where medicare part D might pay part of it).  Consider seeing a psychiatrist, as we discussed (we mentioned Dr. Toy Care, but you may need to see if someone is preferred through your insurance). This might help with mood, sleep, ADD, in making medication adjustments to help with energy and mood.

## 2017-09-16 NOTE — Telephone Encounter (Signed)
Called in-needs appt for future refills

## 2017-09-17 ENCOUNTER — Encounter: Payer: Self-pay | Admitting: Family Medicine

## 2017-10-10 ENCOUNTER — Other Ambulatory Visit: Payer: Self-pay | Admitting: Family Medicine

## 2017-10-14 ENCOUNTER — Other Ambulatory Visit: Payer: Self-pay | Admitting: Cardiovascular Disease

## 2017-10-18 NOTE — Telephone Encounter (Signed)
°*  STAT* If patient is at the pharmacy, call can be transferred to refill team.   1. Which medications need to be refilled? (please list name of each medication and dose if known) Lunesta  2. Which pharmacy/location (including street and city if local pharmacy) is medication to be sent to?[facility] [phone]  3. Do they need a 30 day or 90 day supply? 30 and refills

## 2017-10-18 NOTE — Telephone Encounter (Signed)
New message    Patient is out of medication, out of town.  *STAT* If patient is at the pharmacy, call can be transferred to refill team.   1. Which medications need to be refilled? (please list name of each medication and dose if known) Eszopiclone 3 MG TABS  2. Which pharmacy/location (including street and city if local pharmacy) is medication to be sent to? [facility]  Phone [phone]  3. Do they need a 30 day or 90 day supply?[place]

## 2017-10-18 NOTE — Telephone Encounter (Signed)
Refill called into the pharmacy for lunesta. Patient made aware.

## 2017-10-28 ENCOUNTER — Telehealth: Payer: Self-pay | Admitting: *Deleted

## 2017-10-28 NOTE — Telephone Encounter (Signed)
Patient going out of town and asking for written rx for lunesta 3mg  to take with her. Said that you and her had talked about you taking over her Nicole Allen and she is ready for you to now do that. She said that a written rx is easier when out of state. Needs it dated for 11/13/17 if possible and #30 or #90 would be great, whatever you are willing to do.

## 2017-10-28 NOTE — Telephone Encounter (Signed)
My understanding was that Dr. Claiborne Billings was going to prescribe it until her follow-up with him (per my last note, said June, but looks like it is scheduled for July).  She just got this refilled from Dr. Evette Georges office 4/19.  We can't have two providers writing for the same controlled substances. It is my recommendation that she continue to get med refilled from Dr. Claiborne Billings until she has her follow-up with him.  If she is released/no changes made, then I'm happy to take over.  If she is going OOT and needs rx written before she goes due to travel, I'm sure their office can accommodate that if provided the detailed info.

## 2017-10-30 NOTE — Telephone Encounter (Signed)
Left detailed message for patient.

## 2017-11-21 DIAGNOSIS — Z0279 Encounter for issue of other medical certificate: Secondary | ICD-10-CM

## 2017-11-28 ENCOUNTER — Telehealth: Payer: Self-pay | Admitting: Family Medicine

## 2017-11-28 MED ORDER — AMPHETAMINE-DEXTROAMPHET ER 30 MG PO CP24: 30.0000 mg | ORAL_CAPSULE | ORAL | 0 refills | Status: DC

## 2017-11-28 NOTE — Telephone Encounter (Signed)
Pt called requesting refill on Dextroamphetamine 30 mg extended release #90 to [facility]. She said Dr Tomi Bamberger gave her Dextroamphetamine 20mg  extended release to try to see if it helped with her sleep and it did not. Per pt, Dr Tomi Bamberger told her that if the 20mg  did not help with her sleep, she would change back to 30 mg so pt wants 30 mg.

## 2018-01-07 ENCOUNTER — Other Ambulatory Visit: Payer: Self-pay | Admitting: Family Medicine

## 2018-01-07 ENCOUNTER — Other Ambulatory Visit: Payer: Self-pay | Admitting: Cardiovascular Disease

## 2018-01-07 NOTE — Telephone Encounter (Signed)
Rx sent to pharmacy   

## 2018-01-08 ENCOUNTER — Other Ambulatory Visit: Payer: Self-pay

## 2018-01-08 MED ORDER — ESZOPICLONE 3 MG PO TABS: 3.0000 mg | ORAL_TABLET | Freq: Every evening | ORAL | 0 refills | Status: DC | PRN

## 2018-01-08 NOTE — Telephone Encounter (Signed)
Verbal of Eszopiclone called into [facility].

## 2018-01-13 ENCOUNTER — Telehealth: Payer: Self-pay | Admitting: *Deleted

## 2018-01-13 ENCOUNTER — Ambulatory Visit: Payer: Medicare Other | Admitting: Cardiovascular Disease

## 2018-01-13 ENCOUNTER — Encounter: Payer: Self-pay | Admitting: Cardiovascular Disease

## 2018-01-13 VITALS — BP 128/78 | HR 83 | Ht 59.0 in | Wt 111.4 lb

## 2018-01-13 DIAGNOSIS — G4761 Periodic limb movement disorder: Secondary | ICD-10-CM

## 2018-01-13 DIAGNOSIS — G478 Other sleep disorders: Secondary | ICD-10-CM

## 2018-01-13 DIAGNOSIS — Z8249 Family history of ischemic heart disease and other diseases of the circulatory system: Secondary | ICD-10-CM

## 2018-01-13 DIAGNOSIS — R0683 Snoring: Secondary | ICD-10-CM

## 2018-01-13 DIAGNOSIS — E039 Hypothyroidism, unspecified: Secondary | ICD-10-CM | POA: Diagnosis not present

## 2018-01-13 DIAGNOSIS — E781 Pure hyperglyceridemia: Secondary | ICD-10-CM | POA: Diagnosis not present

## 2018-01-13 DIAGNOSIS — G4730 Sleep apnea, unspecified: Secondary | ICD-10-CM | POA: Diagnosis not present

## 2018-01-13 DIAGNOSIS — G4700 Insomnia, unspecified: Secondary | ICD-10-CM | POA: Diagnosis not present

## 2018-01-13 DIAGNOSIS — G4719 Other hypersomnia: Secondary | ICD-10-CM

## 2018-01-13 DIAGNOSIS — R5383 Other fatigue: Secondary | ICD-10-CM

## 2018-01-13 NOTE — Patient Instructions (Signed)
Medication Instructions:  Your physician recommends that you continue on your current medications as directed. Please refer to the Current Medication list given to you today.  Testing/Procedures: Your physician has recommended that you have a sleep study. This test records several body functions during sleep, including: brain activity, eye movement, oxygen and carbon dioxide blood levels, heart rate and rhythm, breathing rate and rhythm, the flow of air through your mouth and nose, snoring, body muscle movements, and chest and belly movement.  ---use sound machine and take Lunesta the night of the study  Follow-Up: Your physician wants you to follow-up in: 4 months with Dr. Claiborne Billings (sleep clinic). You will receive a reminder letter in the mail two months in advance. If you don't receive a letter, please call our office to schedule the follow-up appointment.  Any Other Special Instructions Will Be Listed Below (If Applicable).     If you need a refill on your cardiac medications before your next appointment, please call your pharmacy.

## 2018-01-13 NOTE — Telephone Encounter (Signed)
Left sleep study appointment details on VM. Ok per dpr.

## 2018-01-13 NOTE — Progress Notes (Signed)
Cardiology Office Note    Date:  01/15/2018   ID:  Nicole Allen, DOB [birth year], MRN [id number]  PCP:  Rita Ohara, MD  Cardiologist:  Shelva Majestic, MD   No chief complaint on file.  Follow-up evaluation  History of Present Illness:  Nicole Allen is a 67 y.o. female who had undergone a sleep study interpreted by [name] in August 2017.  The patient  continued to have significant difficulty with sleep and is was referred by [name] for sleep evaluation and possible restless legs.  I saw her for initial evaluation on 04/16/2017, and last saw her in December 2018.  She presents for follow-up evaluation.  Nicole Allen has a history of hypothyroidism, limb movements of sleep, attention deficit disorder, hyperlipidemia, and GERD.  She was having significant difficulty with sleep with some insomnia and ultimately was referred for sleep study in 02/24/2016 which was interpreted by Dr. Annamaria Boots.  I personally reviewed this sleep study in detail.  The patient had reduced sleep efficiency at 51.7%.  She had markedly prolonged rim latency at 266 minutes.  She also had significant reduction in rems sleep at only 4%.  Wake after sleep onset was 137 minutes.  She was found to have an overall AHI of 4.5 per hour.  Do not see any mention of the respiratory disturbance index, but I suspect this was increased based on at least 17 respiratory effort related arousals.  AHI while sleeping supine, was 11.7 and consistent with mild sleep apnea.  She had moderate snoring.  There was no significant oxygen desaturation.  She had slight increase in periodic limb movements of sleep at 51, resulting in index of 16.36.  Associated arousals with leg movement index was 5.8. When I initially saw her, she the patient denied any urge to move or painful restless legs , and her symptoms were not suggestive of restless leg syndrome.  Reportedly, she was told to have a trial of Mirapex by [name], which she never did.  She  had difficulty with sleep initiation and maintenance.  When I initially saw her she was having difficulty particularly with sleep initiation and maintenance.  She typically goes to bed between 10 and 11 PM at night, but may stay awake for several hours before she falls asleep.  However, if she stays up until 1 AM and tries to go to sleep then she typically falls asleep in about 30 minutes.  She often wakes up between 8:30 AM and 10 AM.  She notes frequent awakenings.  She was given a prescription for trazodone for sleep, which has not been very helpful.  She did not tolerate Ambien in the past.  She was complaining of being tired.  When the day and was at a point where she not feel safe driving toward the daytime since she may fall a sleep.   When I saw her, I recommended a trial of Lunesta 2 mg, which would be helpful both for sleep initiation and maintenance.  She had issues with her insurance company covering this, but ultimately she instituted this therapy.  I referred her for follow-up sleep study which was done on 06/08/2017.  Unfortunately, this was the night of the snowstorm.  She did not have a good experience in the lab, particularly with reference to the sleep tech.  She often sleeps with a sound machine and the tech would not let her use it as result, she was very anxious and again had difficulty  with sleep initiation.  Of note, since starting the Lunesta at home, she states that this has been very effective 90% of the time and allowing her to fall sleep within 30 minutes and usually sleep duration and maintenance was significantly improved.  Unfortunately, the night of the sleep study because of difficulty with sleep initiation, a cold environment, and not feeling comfortable with tech sleep efficiency was poor at 51.4%.  Wake after sleep onset was 161 minutes.  Her AHIwas only minimally increased at 5.4 per hour with an RDI of 6.9 per hour.  There was a positional component with supine sleep, AHI  of 11.6.  She was unable to achieve rems sleep.  In contrast, since she had been on Lunesta at home and was sleeping for longer duration.  She had noticed over the past month that she was dreaming.  Interestingly, she feels that since she is started [name].  She is not moving her legs were feeling this jittery sensation.  In contrast to her previous sleep study, on her most recent sleep study she did not have any periodic limb movement disorder of sleep.  She continues to take Aldderiall XR for her ADD.  She is on omeprazole for GERD.  She takes simvastatin 10 mg at bedtime for hyperlipidemia.  She also is on levothyroxine 50 g.    At her last office visit on December 17,2018 I further titrated Lunesta to 3 mg daily.  She believes that she is sleeping and dreaming well.  However she continues to experience significant fatigue.  She typically goes to bed at 11 PM.  She often reads a Kindl before going to sleep.  Denies definitive dozing episodes during the day but admits to ongoing fatigability.  She presents for reevaluation.   Past Medical History:  Diagnosis Date  . ADD (attention deficit disorder)   . Allergy    SEASONAL  . BCC (basal cell carcinoma of skin)    chest; Dr. Tonia Brooms  . BCC (basal cell carcinoma)    Tonia Brooms  . Cervical spondylosis   . Hearing loss    high frequency (Dr. Prescott Parma); hearing aids recommended; bilateral  . History of kidney stones    x3  . Hypothyroidism 2015  . Insomnia    worse with menopause  . Kidney stone    x3, Dr. Reece Agar  . Osteopenia   . Postmenopausal    on HRT--sees GYN  . Pure hypercholesterolemia     Past Surgical History:  Procedure Laterality Date  . CATARACT EXTRACTION Bilateral L 10/18/15 R 12/13/15   Dr. Herbert Deaner  . CESAREAN SECTION  [id number]  . COLONOSCOPY  2003, 2013   Dr. Collene Mares  . HYMENECTOMY  1993  . KNEE SURGERY Right 09/29/2014   Dr. Jim Desanctis replacement/reconstruction  . TONSILLECTOMY AND ADENOIDECTOMY    . TUBAL LIGATION   1991    Current Medications: Outpatient Medications Prior to Visit  Medication Sig Dispense Refill  . amphetamine-dextroamphetamine (ADDERALL XR) 30 MG 24 hr capsule Take 1 capsule (30 mg total) by mouth every morning. 90 capsule 0  . aspirin 81 MG tablet Take 81 mg by mouth daily.    . cetirizine (ZYRTEC) 10 MG tablet Take 10 mg by mouth daily.    . Coenzyme Q10 (COQ10 PO) Take 1 tablet by mouth daily.    . Eszopiclone 3 MG TABS Take 1 tablet (3 mg total) by mouth at bedtime as needed. Need OV 15 tablet 0  . fluticasone (FLONASE) 50 MCG/ACT nasal spray  USE 2 SPRAYS IN EACH NOSTRIL ONCE A DAY. 16 g 5  . Multiple Vitamin (MULTIVITAMIN) tablet Take 1 tablet by mouth daily.    . mupirocin ointment (BACTROBAN) 2 % Place 1 application into the nose as directed.    Marland Kitchen omeprazole (PRILOSEC) 40 MG capsule Take 1 capsule (40 mg total) by mouth daily. 90 capsule 1  . OVER THE COUNTER MEDICATION vagifilm    . simvastatin (ZOCOR) 10 MG tablet Take 1 tablet (10 mg total) by mouth at bedtime. 90 tablet 3  . SYNTHROID 50 MCG tablet TAKE 1 TABLET ONCE DAILY BEFORE BREAKFAST. 90 tablet 1  . TURMERIC PO Take by mouth daily.    . fluticasone (FLONASE) 50 MCG/ACT nasal spray USE 2 SPRAYS IN EACH NOSTRIL ONCE A DAY. 16 g 2  . mupirocin nasal ointment (BACTROBAN NASAL) 2 % Apply to the affected area of skin at left nostril twice daily for 5-7 days. (Patient taking differently: Place 1 application into the nose as directed. Apply to the affected area of skin at left nostril twice daily for 5-7 days.) 10 g 0   No facility-administered medications prior to visit.      Allergies:   Morphine and related and Codeine   Social History   Socioeconomic History  . Marital status: Married    Spouse name: Not on file  . Number of children: 2  . Years of education: Not on file  . Highest education level: Not on file  Occupational History  . Occupation: retired Teaching laboratory technician  Social Needs  . Financial resource  strain: Not on file  . Food insecurity:    Worry: Not on file    Inability: Not on file  . Transportation needs:    Medical: Not on file    Non-medical: Not on file  Tobacco Use  . Smoking status: Never Smoker  . Smokeless tobacco: Never Used  Substance and Sexual Activity  . Alcohol use: Yes    Alcohol/week: 0.0 oz    Comment: 5-7/week (2 drinks on the weekends, occasional glass during the week once or twice).  . Drug use: No  . Sexual activity: Yes    Partners: Male  Lifestyle  . Physical activity:    Days per week: Not on file    Minutes per session: Not on file  . Stress: Not on file  Relationships  . Social connections:    Talks on phone: Not on file    Gets together: Not on file    Attends religious service: Not on file    Active member of club or organization: Not on file    Attends meetings of clubs or organizations: Not on file    Relationship status: Not on file  Other Topics Concern  . Not on file  Social History Narrative   Lives with husband.  Son in [place], [place] in [place], Daughter is in [place]. Grandson (born 04/2015, in [place]).   Son with alcohol issues (and arrested for domestic violence summer/fall 2017)     Additional social history is notable that she was born in [place], [place].  She is a Engineer, water by trade.  She has ADD.  There is no tobacco history.  Family History:  The patient's family history includes ADD / ADHD in her daughter and son; Alzheimer's disease in her maternal grandmother; Arthritis in her mother; Asthma in her mother; Cancer in her maternal grandfather and paternal grandfather; Heart disease in her father; Hyperlipidemia in her father and mother; Hypertension  in her father and mother; Osteoporosis in her mother; Sleep apnea in her sister.   Her mother died at 10, father died at age 54 and had suffered a myocardial infarction.  She has one sister with obstructive sleep apnea and her father also had obstructive sleep  apnea.  ROS General: Negative; No fevers, chills, or night sweats;  HEENT: Negative; No changes in vision or hearing, sinus congestion, difficulty swallowing Pulmonary: Negative; No cough, wheezing, shortness of breath, hemoptysis Cardiovascular: Negative; No chest pain, presyncope, syncope, palpitations GI: Negative; No nausea, vomiting, diarrhea, or abdominal pain GU: Negative; No dysuria, hematuria, or difficulty voiding Musculoskeletal: Negative; no myalgias, joint pain, or weakness Hematologic/Oncology: Negative; no easy bruising, bleeding Endocrine: Negative; no heat/cold intolerance; no diabetes Neuro: Negative; no changes in balance, headaches Skin: Negative; No rashes or skin lesions Psychiatric: Negative; No behavioral problems, depression Sleep: Positive for insomnia, snoring, fatigability, limb movement disorder of sleep, no hypnogognic hallucinations, no cataplexy Other comprehensive 14 point system review is negative.   PHYSICAL EXAM:   VS:  BP 128/78   Pulse 83   Ht _0  (1.499 m)   Wt 111 lb 6.4 oz (50.5 kg)   BMI 22.50 kg/m     Repeat blood pressure by me was 110/68  Wt Readings from Last 3 Encounters:  01/13/18 111 lb 6.4 oz (50.5 kg)  09/16/17 110 lb 12.8 oz (50.3 kg)  06/17/17 108 lb (49 kg)    General: Alert, oriented, no distress.  Skin: normal turgor, no rashes, warm and dry HEENT: Normocephalic, atraumatic. Pupils equal round and reactive to light; sclera anicteric; extraocular muscles intact;  Nose without nasal septal hypertrophy Mouth/Parynx benign; Mallinpatti scale 2 Neck: No JVD, no carotid bruits; normal carotid upstroke Lungs: clear to ausculatation and percussion; no wheezing or rales Chest wall: without tenderness to palpitation Heart: PMI not displaced, RRR, s1 s2 normal, 1/6 systolic murmur, no diastolic murmur, no rubs, gallops, thrills, or heaves Abdomen: soft, nontender; no hepatosplenomehaly, BS+; abdominal aorta nontender and not  dilated by palpation. Back: no CVA tenderness Pulses 2+ Musculoskeletal: full range of motion, normal strength, no joint deformities Extremities: no clubbing cyanosis or edema, Homan's sign negative  Neurologic: grossly nonfocal; Cranial nerves grossly wnl Psychologic: Normal mood and affect    Studies/Labs Reviewed:   EKG:  EKG is ordered today. ECG (independently read by me): Sinus rhythm at 83 bpm.  No ectopy.  QTc interval 479 ms.  December 2018 ECG (independently read by me): Normal sinus rhythm at 67 bpm.  No ectopy.  QTC interval 467 ms.  Recent Labs: BMP Latest Ref Rng & Units 09/12/2017 07/05/2016 12/26/2015  Glucose 65 - 99 mg/dL 87 92 91  BUN 8 - 27 mg/dL 16 15 -  Creatinine 0.57 - 1.00 mg/dL 0.67 0.63 -  BUN/Creat Ratio 12 - 28 24 - -  Sodium 134 - 144 mmol/L 140 138 -  Potassium 3.5 - 5.2 mmol/L 4.4 4.1 -  Chloride 96 - 106 mmol/L 101 105 -  CO2 20 - 29 mmol/L 27 25 -  Calcium 8.7 - 10.3 mg/dL 9.3 8.7 -     Hepatic Function Latest Ref Rng & Units 09/12/2017 04/10/2017 07/05/2016  Total Protein 6.0 - 8.5 g/dL 6.6 6.6 6.1  Albumin 3.6 - 4.8 g/dL 4.2 - 4.1  AST 0 - 40 IU/L _1 ALT 0 - 32 IU/L _2 Alk Phosphatase 39 - 117 IU/L 60 - 42  Total Bilirubin 0.0 -  1.2 mg/dL 0.2 0.4 0.6  Bilirubin, Direct 0.0 - 0.2 mg/dL - 0.1 -    CBC Latest Ref Rng & Units 09/12/2017 10/08/2016 07/05/2016  WBC 3.4 - 10.8 x10E3/uL 5.8 5.7 4.9  Hemoglobin 11.1 - 15.9 g/dL 12.7 13.4 11.9  Hematocrit 34.0 - 46.6 % 37.3 40.0 35.6  Platelets 150 - 379 x10E3/uL 204 209 201   Lab Results  Component Value Date   MCV 87 09/12/2017   MCV 85.3 10/08/2016   MCV 88.3 07/05/2016   Lab Results  Component Value Date   TSH 3.380 09/12/2017   No results found for: HGBA1C   BNP No results found for: BNP  ProBNP No results found for: PROBNP   Lipid Panel     Component Value Date/Time   CHOL 210 (H) 09/12/2017 0849   TRIG 115 09/12/2017 0849   HDL 79 09/12/2017 0849   CHOLHDL 2.7  09/12/2017 0849   CHOLHDL 2.1 07/05/2016 0724   VLDL 21 07/05/2016 0724   LDLCALC 108 (H) 09/12/2017 0849     RADIOLOGY: No results found.   Additional studies/ records that were reviewed today include:  I reviewed the patient's previous sleep studies and in particular the most recent June 08, 2017 evaluation.    ASSESSMENT:    1. Sleep apnea, unspecified type   2. Snoring   3. Fatigue, unspecified type   4. Frequent nocturnal awakening   5. PLMD (periodic limb movement disorder)   6. Excessive daytime sleepiness   7. Non-restorative sleep   8. Family history of early CAD   24. Pure hyperglyceridemia   10. 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  She also is a history of hypothyroidism which has been stable on levothyroxine 50 g.  She has attention deficit disorder and takes adderall XR  30 mg every morning.  .  She has had issues with sleep initiation and sleep maintenance.  On her initial sleep study she was found to have very mild sleep apnea, unspecified type and was found to have significant periodic limb movement disorder of sleep with a PLM asked index of 16.4.  Since institution of Lunesta, she has felt improved with reference to sleep initiation and maintenance and also had noticed improvements in her periodic limb movements of sleep.  He now admits to dreaming.  Of note on her most recent sleep study of June 08, 2017 she did not develop any REM sleep.  She was found to have mild overall sleep apnea with an AHI of 5.4 and RDI of 6.9.  Sleep apnea was more significant with supine posture with an AHI of 11.6 but rem sleep was not achieved.  She continues to note significant fatigability but denies any overt daytime dosing.  She is dreaming now suggestive that she is obtaining rem sleep.  I suspect her sleep apnea may  be worse with rem sleep but unfortunately on her sleep study this was not able to be evaluated since rem sleep was not obtained.  Unfortunately, she is now 8 months since her last sleep study and as result due to Medicare requirements would not be able to have a CPAP titration study without a repeat evaluation.  I discussed with her possible initiation of an oral appliance for treatment of her at least mild sleep apnea but she would prefer not to  have this done since she has had significant orthodontic issues in the past and that her numerous patients potentially having effects on their dental biting.  After much discussion, she would prefer reevaluation.  As result we will reschedule her for a repeat sleep evaluation with probable CPAP titration if can be done at the same night.  Been discussed with her the potential for auto Pap titration but again this would not be allowed based on her insurance without another sleep assessment.  She will take Lunesta 3 mg at night of her sleep study and she will also bring with her her noise machine which is important for her to initiate sleep.  Apparently she would was not given the opportunity to use this on her last evaluation.  I will see her in 4 months for reevaluation.    Medication Adjustments/Labs and Tests Ordered: Current medicines are reviewed at length with the patient today.  Concerns regarding medicines are outlined above.  Medication changes, Labs and Tests ordered today are listed in the Patient Instructions below. Patient Instructions  Medication Instructions:  Your physician recommends that you continue on your current medications as directed. Please refer to the Current Medication list given to you today.  Testing/Procedures: Your physician has recommended that you have a sleep study. This test records several body functions during sleep, including: brain activity, eye movement, oxygen and carbon dioxide blood levels, heart rate and rhythm,  breathing rate and rhythm, the flow of air through your mouth and nose, snoring, body muscle movements, and chest and belly movement.  ---use sound machine and take Lunesta the night of the study  Follow-Up: Your physician wants you to follow-up in: 4 months with Dr. Claiborne Billings (sleep clinic). You will receive a reminder letter in the mail two months in advance. If you don't receive a letter, please call our office to schedule the follow-up appointment.  Any Other Special Instructions Will Be Listed Below (If Applicable).     If you need a refill on your cardiac medications before your next appointment, please call your pharmacy.      Signed, Shelva Majestic, MD  01/15/2018 7:48 PM    [facility] [address] [place], [place], [place]  [address] Phone: [phone]

## 2018-01-15 ENCOUNTER — Encounter: Payer: Self-pay | Admitting: Cardiovascular Disease

## 2018-01-20 NOTE — Addendum Note (Signed)
Addended by: Zebedee Iba on: 01/20/2018 09:38 AM   Modules accepted: Orders

## 2018-02-01 ENCOUNTER — Encounter (HOSPITAL_BASED_OUTPATIENT_CLINIC_OR_DEPARTMENT_OTHER): Payer: Self-pay

## 2018-02-03 ENCOUNTER — Other Ambulatory Visit: Payer: Self-pay

## 2018-02-03 ENCOUNTER — Other Ambulatory Visit: Payer: Self-pay | Admitting: Cardiovascular Disease

## 2018-02-03 MED ORDER — ESZOPICLONE 3 MG PO TABS: 3.0000 mg | ORAL_TABLET | Freq: Every day | ORAL | 3 refills | Status: DC

## 2018-02-05 ENCOUNTER — Telehealth: Payer: Self-pay

## 2018-02-05 MED ORDER — ESZOPICLONE 3 MG PO TABS: 3.0000 mg | ORAL_TABLET | Freq: Every day | ORAL | 1 refills | Status: DC

## 2018-02-05 NOTE — Telephone Encounter (Signed)
Rx sent in for Lunesta 3mg  at bedtime.. [place].

## 2018-02-25 ENCOUNTER — Ambulatory Visit (HOSPITAL_BASED_OUTPATIENT_CLINIC_OR_DEPARTMENT_OTHER): Payer: Medicare Other | Attending: Cardiovascular Disease | Admitting: Cardiovascular Disease

## 2018-02-25 VITALS — Ht 59.0 in | Wt 110.0 lb

## 2018-02-25 DIAGNOSIS — Z7982 Long term (current) use of aspirin: Secondary | ICD-10-CM | POA: Diagnosis not present

## 2018-02-25 DIAGNOSIS — Z79899 Other long term (current) drug therapy: Secondary | ICD-10-CM | POA: Diagnosis not present

## 2018-02-25 DIAGNOSIS — R0683 Snoring: Secondary | ICD-10-CM

## 2018-02-25 DIAGNOSIS — R0902 Hypoxemia: Secondary | ICD-10-CM | POA: Diagnosis not present

## 2018-02-25 DIAGNOSIS — Z8249 Family history of ischemic heart disease and other diseases of the circulatory system: Secondary | ICD-10-CM

## 2018-02-25 DIAGNOSIS — Z79890 Hormone replacement therapy (postmenopausal): Secondary | ICD-10-CM | POA: Insufficient documentation

## 2018-02-25 DIAGNOSIS — G4733 Obstructive sleep apnea (adult) (pediatric): Secondary | ICD-10-CM

## 2018-02-25 DIAGNOSIS — R5383 Other fatigue: Secondary | ICD-10-CM

## 2018-02-25 DIAGNOSIS — E781 Pure hyperglyceridemia: Secondary | ICD-10-CM

## 2018-02-25 DIAGNOSIS — E039 Hypothyroidism, unspecified: Secondary | ICD-10-CM

## 2018-02-25 DIAGNOSIS — G4719 Other hypersomnia: Secondary | ICD-10-CM

## 2018-02-25 DIAGNOSIS — G4761 Periodic limb movement disorder: Secondary | ICD-10-CM

## 2018-02-25 DIAGNOSIS — G478 Other sleep disorders: Secondary | ICD-10-CM

## 2018-02-25 DIAGNOSIS — G4700 Insomnia, unspecified: Secondary | ICD-10-CM

## 2018-03-04 IMAGING — CT CT HEART SCORING
2 series · 16 of 20 positions shown, 18 images · non-contrast
Comparison: None.

EXAM:
OVER-READ INTERPRETATION  CT CHEST

The following report is an over-read performed by radiologist Dr.
does not include interpretation of cardiac or coronary anatomy or
pathology. The coronary calcium score interpretation by the
cardiologist is attached.
CLINICAL DATA: Risk stratification
Coronary Calcium Score
MEDICATIONS:
None.
TECHNIQUE: The patient was scanned on a Siemens Somatom 64 slice scanner. Axial
non-contrast 3 mm slices were carried out through the heart. The
data set was analyzed on a dedicated work station and scored using
the Agatson method.

[Series 2: casc 3.0 i36f 2 bestsyst 36 % · axial · 0.28mm/px · z∈[-215,-131]mm · 8 of 36 slices shown, 10 images]
[im 4/36  vessel]
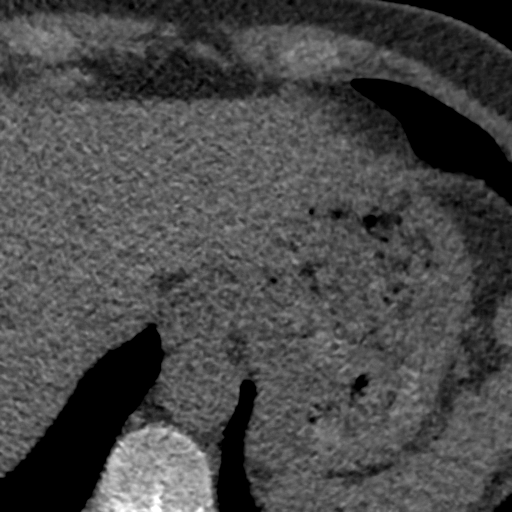
[im 4/36  lung]
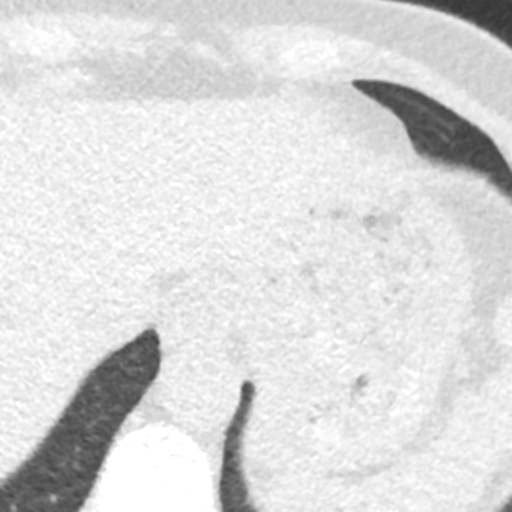
[im 8/36  vessel]
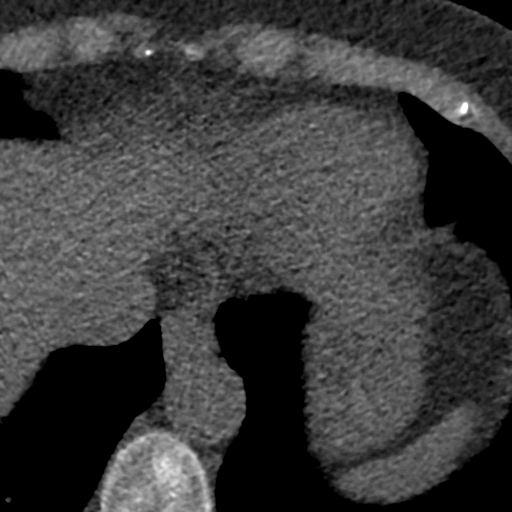
[im 12/36  vessel]
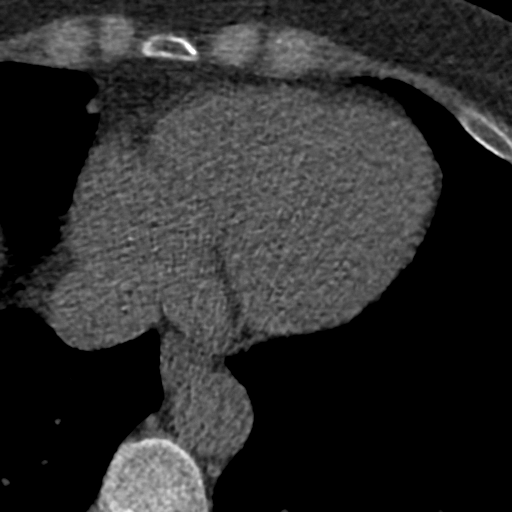
[im 16/36  vessel]
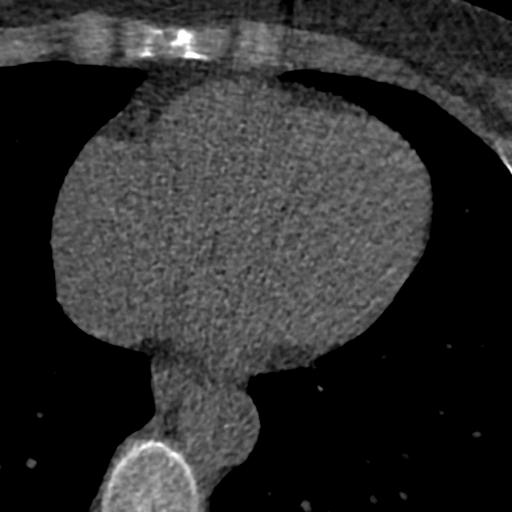
[im 20/36  vessel]
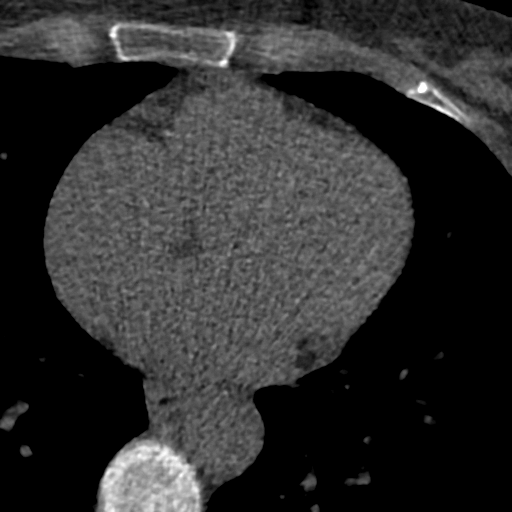
[im 20/36  lung]
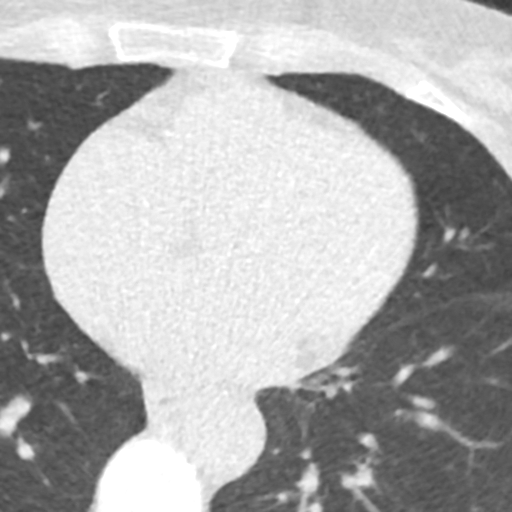
[im 24/36  vessel]
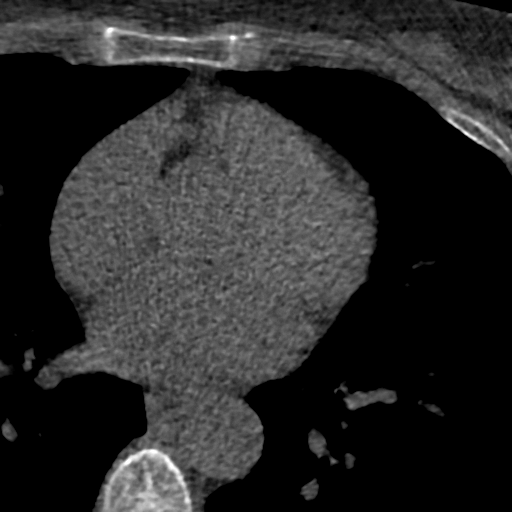
[im 28/36  vessel]
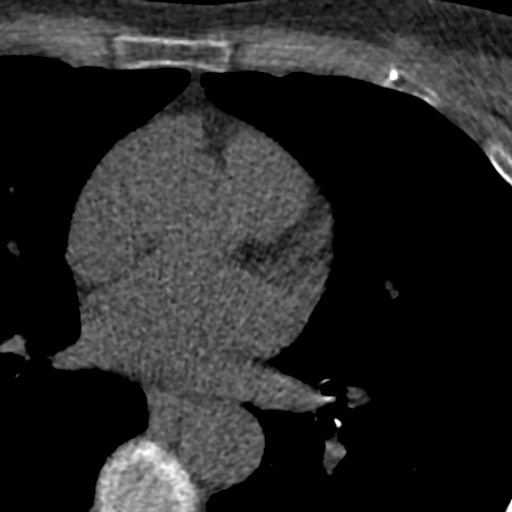
[im 32/36  vessel]
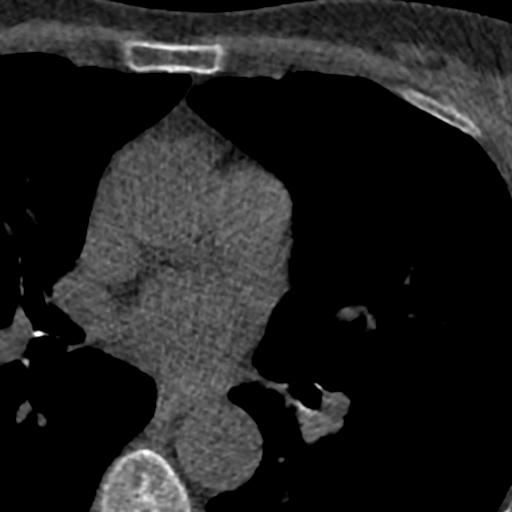

[Series 4: lung st 69 % · axial · 0.58mm/px · z∈[-214,-130]mm · 8 of 37 slices shown]
[im 5/37  lung]
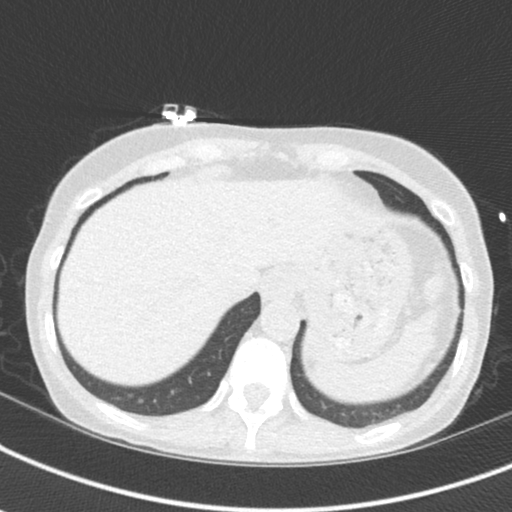
[im 9/37  lung]
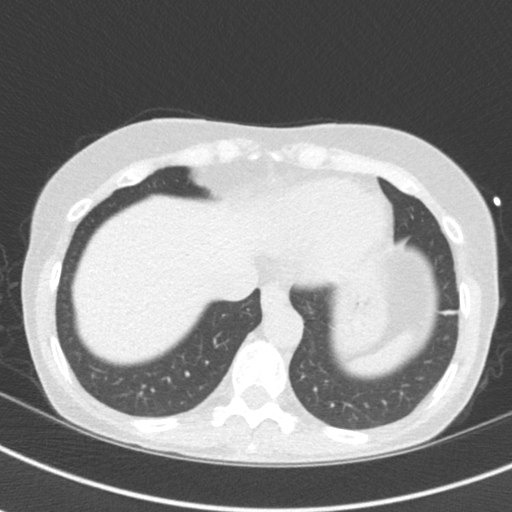
[im 13/37  lung]
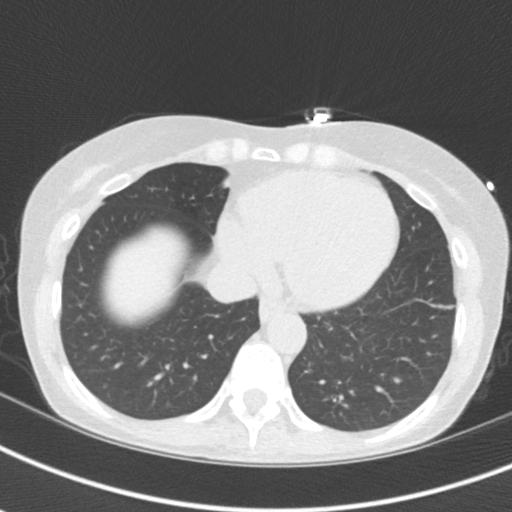
[im 17/37  lung]
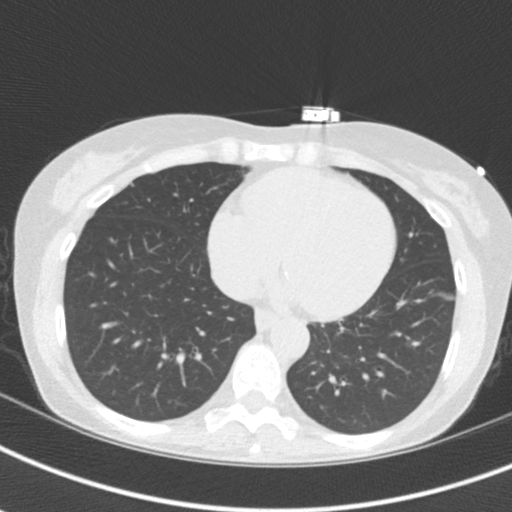
[im 21/37  lung]
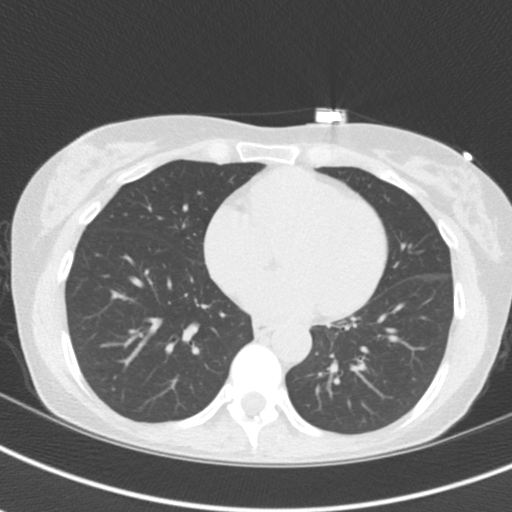
[im 25/37  lung]
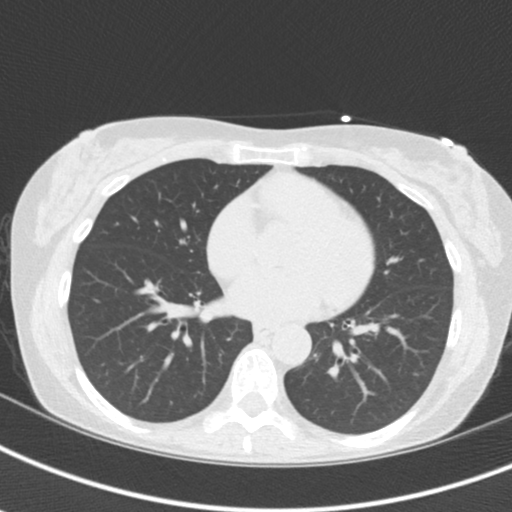
[im 29/37  lung]
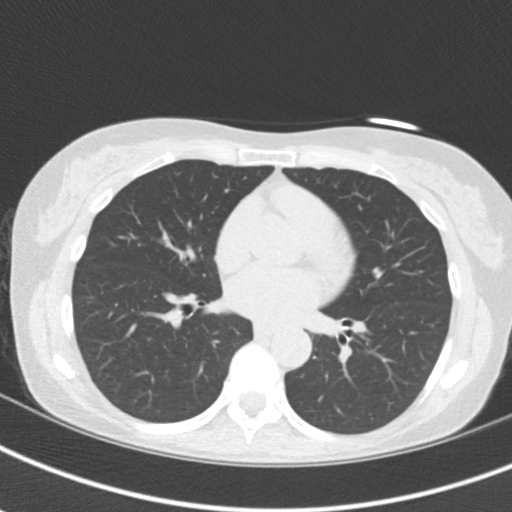
[im 33/37  lung]
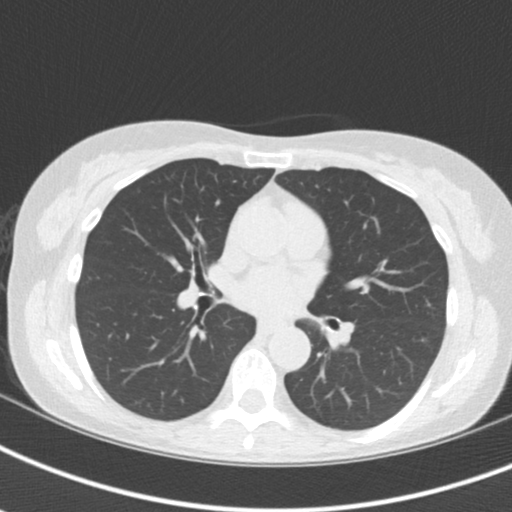

[16 of 20 positions shown; findings below may reference images not displayed]

FINDINGS: The visualized portions of the lungs show mild scarring or
atelectasis in the left lower lobe, but are otherwise clear. The
visualized portions of the mediastinum and chest wall are
unremarkable. Tiny fluid attenuation cyst noted in left hepatic
lobe.
IMPRESSION: No significant non-cardiac abnormality seen in visualized portion of
the thorax.
FINDINGS: Non-cardiac: See separate report from [REDACTED].

Ascending Aorta:  Normal size, no calcifications.

Pericardium: Normal.

Coronary arteries:  Normal origin.
IMPRESSION: Coronary calcium score of 0. This was 0 percentile for age and sex
matched control.

Shalanda Mon

## 2018-03-23 NOTE — Procedures (Signed)
Patient Name: Nicole Allen, Nicole Allen Date: 02/25/2018 Gender: Female D.O.B: [birth year] Age (years): 89 Referring Provider: Shelva Majestic MD, ABSM Height (inches): 71 Interpreting Physician: Shelva Majestic MD, ABSM Weight (lbs): 110 RPSGT: Carolin Coy BMI: 22 MRN: [id number] Neck Size: 12.00  CLINICAL INFORMATION Sleep Study Type: NPSG  Indication for sleep study: Fatigue, Snoring  Epworth Sleepiness Score: 0  Most recent polysomnogram dated 06/08/2017 revealed an AHI of 5.4/h and RDI of 6.9/h.  SLEEP STUDY TECHNIQUE As per the AASM Manual for the Scoring of Sleep and Associated Events v2.3 (April 2016) with a hypopnea requiring 4% desaturations.  The channels recorded and monitored were frontal, central and occipital EEG, electrooculogram (EOG), submentalis EMG (chin), nasal and oral airflow, thoracic and abdominal wall motion, anterior tibialis EMG, snore microphone, electrocardiogram, and pulse oximetry.  MEDICATIONS     amphetamine-dextroamphetamine (ADDERALL XR) 30 MG 24 hr capsule         aspirin 81 MG tablet         cetirizine (ZYRTEC) 10 MG tablet         Coenzyme Q10 (COQ10 PO)         Eszopiclone 3 MG TABS (Expired)         fluticasone (FLONASE) 50 MCG/ACT nasal spray         Multiple Vitamin (MULTIVITAMIN) tablet         mupirocin ointment (BACTROBAN) 2 %         omeprazole (PRILOSEC) 40 MG capsule         OVER THE COUNTER MEDICATION         simvastatin (ZOCOR) 10 MG tablet         SYNTHROID 50 MCG tablet         TURMERIC PO      Medications self-administered by patient taken the night of the study : SIMVASTATIN, FLONASE, LUNESTA, COQ-10, ASPIRIN  SLEEP ARCHITECTURE The study was initiated at 10:37:17 PM and ended at 4:36:56 AM.  Sleep onset time was 21.3 minutes and the sleep efficiency was 81.9%%. The total sleep time was 294.5 minutes.  Stage REM latency was 103.0 minutes.  The patient spent 17.5%% of the night in stage N1 sleep, 74.7%% in  stage N2 sleep, 0.0%% in stage N3 and 7.8% in REM.  Alpha intrusion was absent.  Supine sleep was 47.03%.  RESPIRATORY PARAMETERS The overall apnea/hypopnea index (AHI) was 9.6 per hour. The respiratory disturbance index (RDI was increased at 16.9 per hour.There were 13 total apneas, including 7 obstructive, 2 central and 4 mixed apneas. There were 34 hypopneas and 36 RERAs.  The AHI during Stage REM sleep was 2.6 per hour.  AHI while supine was 16.9 per hour.  The mean oxygen saturation was 94.9%. The minimum SpO2 during sleep was 89.0%.  Moderate snoring was noted during this study.  CARDIAC DATA The 2 lead EKG demonstrated sinus rhythm. The mean heart rate was 68.7 beats per minute. Other EKG findings include: None.  LEG MOVEMENT DATA The total PLMS were 0 with a resulting PLMS index of 0.0. Associated arousal with leg movement index was 0.0 .  IMPRESSIONS - Mild obstructive sleep apnea occurred during this study (AHI 9.6/h; RDI 16.9/h); events were of moderate severity with supine posture (AHI 16.9/h). - No significant central sleep apnea occurred during this study (CAI = 0.4/h). - The patient had minimal oxygen desaturation during the study (Min O2 89.0%) - The patient snored with moderate snoring volume. - No cardiac abnormalities were noted during  this study. - Clinically significant periodic limb movements did not occur during sleep. No significant associated arousals.  DIAGNOSIS - Obstructive Sleep Apnea (327.23 [G47.33 ICD-10]) - Nocturnal Hypoxemia (327.26 [G47.36 ICD-10])  RECOMMENDATIONS - Therapeutic CPAP titration to determine optimal pressure required to alleviate sleep disordered breathing. - Effort should be made to optimize nasal and oral pharyngeal patency. - Positional therapy avoiding supine position during sleep. - Avoid alcohol, sedatives and other CNS depressants that may worsen sleep apnea and disrupt normal sleep architecture. - Sleep hygiene should  be reviewed to assess factors that may improve sleep quality. - Weight management and regular exercise should be initiated or continued if appropriate.  [Electronically signed] 03/23/2018 01:56 PM  Shelva Majestic MD, [facility], [place], American Board of Sleep Medicine   NPI: [id number] [place] PH: [phone]   FX: [phone] [place]

## 2018-03-24 ENCOUNTER — Telehealth: Payer: Self-pay | Admitting: *Deleted

## 2018-03-24 ENCOUNTER — Other Ambulatory Visit: Payer: Self-pay | Admitting: Cardiovascular Disease

## 2018-03-24 DIAGNOSIS — G4733 Obstructive sleep apnea (adult) (pediatric): Secondary | ICD-10-CM

## 2018-03-24 NOTE — Telephone Encounter (Signed)
Left message to return a call to discuss sleep study results. 

## 2018-03-24 NOTE — Telephone Encounter (Signed)
-----   Message from Troy Sine, MD sent at 03/23/2018  2:12 PM EDT ----- Mariann Laster, please notify pt and set up for CPAP titration study.

## 2018-03-25 ENCOUNTER — Other Ambulatory Visit: Payer: Medicare Other

## 2018-03-25 ENCOUNTER — Telehealth: Payer: Self-pay | Admitting: Internal Medicine

## 2018-03-25 DIAGNOSIS — Z5181 Encounter for therapeutic drug level monitoring: Secondary | ICD-10-CM

## 2018-03-25 DIAGNOSIS — E7800 Pure hypercholesterolemia, unspecified: Secondary | ICD-10-CM

## 2018-03-25 DIAGNOSIS — E039 Hypothyroidism, unspecified: Secondary | ICD-10-CM

## 2018-03-25 NOTE — Telephone Encounter (Signed)
Hmm, that is confusing.  I don't think she  Needed to be fasting for what was needed, but she may have stated a preference to get done prior to review the results.  Since she was fasting, I ordered lipids.  She was referred to Dr. Chalmers Cater at her last visit. I never received any notes from Dr. Almetta Lovely office.  She is an endo, and if she has been seeing her, she may have done a bunch of labs, as well as rechecked her thyroid (so it is possible TSH isn't needed today). Please see if you can call over to Dr. Almetta Lovely office and get notes and lab results.  If a TSH was done in the last 3-4 months by her, and if f/u is scheduled, we likely can cancel the TSH order.  Thanks

## 2018-03-25 NOTE — Telephone Encounter (Signed)
Per Dr Almetta Lovely office she has TSH done March 06, 2018.   So I cancelled the one from today.

## 2018-03-25 NOTE — Telephone Encounter (Signed)
Pt is here for fasting labs. Per your notes she does not need labs but then your check notes says schedule for fasting labs. Please advise as pt has gotten labs drawn and will be here in a couple days to go over them. ( I will not be here this afternoon to release these labs so please send to clinical)

## 2018-03-26 LAB — LIPID PANEL
Chol/HDL Ratio: 3.2 ratio (ref 0.0–4.4)
Cholesterol, Total: 236 mg/dL — ABNORMAL HIGH (ref 100–199)
HDL: 74 mg/dL (ref >39)
LDL Calculated: 141 mg/dL — ABNORMAL HIGH (ref 0–99)
Triglycerides: 105 mg/dL (ref 0–149)
VLDL CHOLESTEROL CAL: 21 mg/dL (ref 5–40)

## 2018-03-26 LAB — COMPREHENSIVE METABOLIC PANEL
A/G RATIO: 2.3 — AB (ref 1.2–2.2)
ALBUMIN: 4.5 g/dL (ref 3.6–4.8)
ALT: 14 IU/L (ref 0–32)
AST: 18 IU/L (ref 0–40)
Alkaline Phosphatase: 65 IU/L (ref 39–117)
BILIRUBIN TOTAL: 0.3 mg/dL (ref 0.0–1.2)
BUN/Creatinine Ratio: 25 (ref 12–28)
BUN: 14 mg/dL (ref 8–27)
CHLORIDE: 102 mmol/L (ref 96–106)
CO2: 23 mmol/L (ref 20–29)
Calcium: 9.2 mg/dL (ref 8.7–10.3)
Creatinine, Ser: 0.57 mg/dL (ref 0.57–1.00)
GFR calc non Af Amer: 96 mL/min/{1.73_m2} (ref >59)
GFR, EST AFRICAN AMERICAN: 111 mL/min/{1.73_m2} (ref >59)
GLOBULIN, TOTAL: 2 g/dL (ref 1.5–4.5)
Glucose: 93 mg/dL (ref 65–99)
Potassium: 4.4 mmol/L (ref 3.5–5.2)
SODIUM: 142 mmol/L (ref 134–144)
Total Protein: 6.5 g/dL (ref 6.0–8.5)

## 2018-03-26 NOTE — Telephone Encounter (Signed)
Left message to return a call to discuss sleep study results and recommendations. 

## 2018-03-26 NOTE — Progress Notes (Signed)
Chief Complaint  Patient presents with  . Hyperlipidemia    nonfasting, labs already done. Has appt with Dr.Kelly in Oct. Fatigue is so emormous and overwhelming-exhausting.     Hypothyroidism: In January 2018 her Synthroid dose was increased from 25 to 27mcg daily.f/u TSH was okay in April, 2018. Last check here on 49mcg was in 08/2017, TSH bumped up to 3.380. Denied changes to hair/skin/bowels, but had persistent fatigue.  She had requested referral to endocrinologist. She was referred to Dr. Chalmers Cater.  No consult or notes were ever received. We called today and got records from visit in 11/2017 (not the recent labs, until we called again, specifically requesting this).  She reports that she was told to take a double dose of Synthroid on Sundays, doing this since June.  Had TSH repeated 9/5 and was 1.120; she reports she has upcoming appointment. Doesn't feel significantly different since last visit. Still has significant fatigue.  No changes to hair/skin/bowels.  ADD:She continues to take30mg  of Adderall. Deniesside effects. Last filled #90 in 11/28/2017. She had tried the 20mg  dose, but didn't notice any improvement in her sleep, so went back to 30mg  dose with last fill in May. Admits to missing some doses (due to needing to wait after thyroid, can't take too late in the day). Unsure if refills are needed (of any of her meds)  Hyperlipidemia: She was tried off simvastatin in the past(she was only on very low dose, tried without--still high, so then we tried treating mild hypothyroidism, but lipids didn't change). Went back on 10mg  of simvastatin (given family h/o CAD). Tolerating it fine without any side effects. She saw Dr. Dorris Carnes in October2081for consult, who sent her for Ca score CT, which showed Ca score of 0, no evidence of calcified plaque. Suggested cutting back to dose of 5mg  of simvastatincanconsider stopping. She didn't feel like there was good communication with her; she  preferred to stay on the 10mg  dose. Admits to missing them a couple of times/week, no different than in the past. No changes to her diet, still low cholesterol diet (no red meat, eggs once a week, bacon once a week).  She has been seeing Dr. Claiborne Billings for her sleep issues. He started her on Lunesta, due to trouble initiating sleep.  She was titrated up to the 3mg  dose, and per last visit's notes from July, was sleeping better, dreaming, but still having a lot of fatigue.  He sent her for another sleep study, which she had last month: IMPRESSIONS - Mild obstructive sleep apnea occurred during this study (AHI 9.6/h; RDI 16.9/h); events were of moderate severity with supine posture (AHI 16.9/h). - No significant central sleep apnea occurred during this study (CAI = 0.4/h). - The patient had minimal oxygen desaturation during the study (Min O2 89.0%) - The patient snored with moderate snoring volume. - No cardiac abnormalities were noted during this study. - Clinically significant periodic limb movements did not occur during sleep. No significant associated arousals.  Recommended to have CPAP titration study. They have been leaving messages to discuss results, hadn't been aware of this.  She is sleeping with the Lunesta, but still feels exhausted.   PMH, PSH, SH reviewed  Outpatient Encounter Medications as of 03/27/2018  Medication Sig Note  . amphetamine-dextroamphetamine (ADDERALL XR) 30 MG 24 hr capsule Take 1 capsule (30 mg total) by mouth every morning.   Marland Kitchen aspirin 81 MG tablet Take 81 mg by mouth daily. 04/14/2015: .   . cetirizine (ZYRTEC)  10 MG tablet Take 10 mg by mouth daily.   . Coenzyme Q10 (COQ10 PO) Take 1 tablet by mouth daily.   . fluticasone (FLONASE) 50 MCG/ACT nasal spray USE 2 SPRAYS IN EACH NOSTRIL ONCE A DAY.   . Multiple Vitamin (MULTIVITAMIN) tablet Take 1 tablet by mouth daily.   . mupirocin ointment (BACTROBAN) 2 % Place 1 application into the nose as directed.   Marland Kitchen  omeprazole (PRILOSEC) 40 MG capsule Take 1 capsule (40 mg total) by mouth daily.   . simvastatin (ZOCOR) 10 MG tablet Take 1 tablet (10 mg total) by mouth at bedtime.   Marland Kitchen SYNTHROID 50 MCG tablet TAKE 1 TABLET ONCE DAILY BEFORE BREAKFAST. 03/27/2018: On Sunday she takes 162mcg.  . TURMERIC PO Take by mouth daily.   . Eszopiclone 3 MG TABS Take 1 tablet (3 mg total) by mouth at bedtime. Take immediately before bedtime   . [DISCONTINUED] OVER THE COUNTER MEDICATION vagifilm    No facility-administered encounter medications on file as of 03/27/2018.    Allergies  Allergen Reactions  . Morphine And Related Other (See Comments)    Family history.  . Codeine Itching and Rash   ROS:  No fever, chills, URI symptoms, headaches, dizziness, chest pain. Chronic R knee pain, and some swelling of left knee due to bursitis, sees ortho prn. Heartburn controlled with omeprazole.  No bowel changes, abdominal pain or other concerns. See HPI.  PHYSICAL EXAM:  BP 112/64   Pulse 64   Ht 4' 10.75" (1.492 m)   Wt 110 lb 3.2 oz (50 kg)   BMI 22.45 kg/m   Wt Readings from Last 3 Encounters:  03/27/18 110 lb 3.2 oz (50 kg)  02/25/18 110 lb (49.9 kg)  01/13/18 111 lb 6.4 oz (50.5 kg)   Well appearing female, in good spirits (though frustrated with fatigue--not as irritable as in the past) HEENT: conjunctiva and sclera are clear, EOMI, OP clear Neck: no lymphadenopathy, thyromegaly or mass Heart: regular rate and rhythm Lungs: clear bilaterally Abdomen: soft, nontender, no organomegaly or mass Extremities: no edema.  Knees without visible effusion or significant swelling in seated position. Normal pulses Psych: normal mood, affect, hygiene and grooming Neuro: alert and oriented, cranial nerves intact normal gait.     Chemistry      Component Value Date/Time   NA 142 03/25/2018 1417   K 4.4 03/25/2018 1417   CL 102 03/25/2018 1417   CO2 23 03/25/2018 1417   BUN 14 03/25/2018 1417   CREATININE 0.57  03/25/2018 1417   CREATININE 0.63 07/05/2016 0724      Component Value Date/Time   CALCIUM 9.2 03/25/2018 1417   ALKPHOS 65 03/25/2018 1417   AST 18 03/25/2018 1417   ALT 14 03/25/2018 1417   BILITOT 0.3 03/25/2018 1417     Fasting glucose 93  Lab Results  Component Value Date   CHOL 236 (H) 03/25/2018   HDL 74 03/25/2018   LDLCALC 141 (H) 03/25/2018   TRIG 105 03/25/2018   CHOLHDL 3.2 03/25/2018   Prior lipids 08/2017: Cholesterol, Total 100 - 199 mg/dL 210High    Triglycerides 0 - 149 mg/dL 115   HDL >39 mg/dL 79   VLDL Cholesterol Cal 5 - 40 mg/dL 23   LDL Calculated 0 - 99 mg/dL 108High    Chol/HDL Ratio 0.0 - 4.4 ratio 2.7     ASSESSMENT/PLAN:  Pure hypercholesterolemia - higher than in past; no dietary changes, some admitted noncompliance. Overall okay. Cont current  dose - Plan: Lipid panel  Hypothyroidism, unspecified type - per Dr. Chalmers Cater;  now euthyroid on current dose (doubled up on Sundays)  Insomnia, unspecified type - improved with Lunesta per Dr. Claiborne Billings  OSA (obstructive sleep apnea) - moderate when in supine position. Advised that Dr. Evette Georges office has been contacting her to schedule CPAP titration and to call them (not wait for f/u visit)  Need for influenza vaccination - Plan: Flu vaccine HIGH DOSE PF (Fluzone High dose)  Fatigue, unspecified type - suspect related to OSA; thyroid is corrected, getting some sleep with lunesta; needs CPAP trial. Discussed other treatment options (oral device, surgery) - Plan: CBC with Differential/Platelet, Comprehensive metabolic panel  Medication monitoring encounter - Plan: CBC with Differential/Platelet, Lipid panel, Comprehensive metabolic panel   Continue your current medications.  The cholesterol was a little higher than in the past (considered borderline), can be affected by thyroid. Continue the current dose.  Some people zyrtec makes sleepy.  You can try stopping it for a week to see if you energy improves. We  do know that you have sleep apnea, which is likely a contributing factor for your fatigue. Dr. Evette Georges office has been trying to reach you to set up a CPAP titration study. Please call them back to schedule.

## 2018-03-26 NOTE — Telephone Encounter (Signed)
-----   Message from Troy Sine, MD sent at 03/23/2018  2:12 PM EDT ----- Mariann Laster, please notify pt and set up for CPAP titration study.

## 2018-03-27 ENCOUNTER — Encounter: Payer: Self-pay | Admitting: Family Medicine

## 2018-03-27 ENCOUNTER — Ambulatory Visit: Payer: Medicare Other | Admitting: Family Medicine

## 2018-03-27 VITALS — BP 112/64 | HR 64 | Ht 58.75 in | Wt 110.2 lb

## 2018-03-27 DIAGNOSIS — R5383 Other fatigue: Secondary | ICD-10-CM

## 2018-03-27 DIAGNOSIS — Z23 Encounter for immunization: Secondary | ICD-10-CM | POA: Diagnosis not present

## 2018-03-27 DIAGNOSIS — G4700 Insomnia, unspecified: Secondary | ICD-10-CM | POA: Diagnosis not present

## 2018-03-27 DIAGNOSIS — E039 Hypothyroidism, unspecified: Secondary | ICD-10-CM | POA: Diagnosis not present

## 2018-03-27 DIAGNOSIS — E7800 Pure hypercholesterolemia, unspecified: Secondary | ICD-10-CM | POA: Diagnosis not present

## 2018-03-27 DIAGNOSIS — Z5181 Encounter for therapeutic drug level monitoring: Secondary | ICD-10-CM

## 2018-03-27 DIAGNOSIS — G4733 Obstructive sleep apnea (adult) (pediatric): Secondary | ICD-10-CM

## 2018-03-27 NOTE — Progress Notes (Signed)
Patient notified of sleep study results and recommendations. 

## 2018-03-27 NOTE — Telephone Encounter (Signed)
-----   Message from Nicole Sine, MD sent at 03/23/2018  2:12 PM EDT ----- [name], please notify pt and set up for CPAP titration study.

## 2018-03-27 NOTE — Patient Instructions (Signed)
  Continue your current medications.  The cholesterol was a little higher than in the past (considered borderline), can be affected by thyroid. Continue the current dose.  Some people zyrtec makes sleepy.  You can try stopping it for a week to see if you energy improves. We do know that you have sleep apnea, which is likely a contributing factor for your fatigue. Dr. Evette Georges office has been trying to reach you to set up a CPAP titration study. Please call them back to schedule.

## 2018-03-27 NOTE — Telephone Encounter (Signed)
Left message to return a call to discuss sleep study results. 

## 2018-04-01 ENCOUNTER — Encounter: Payer: Self-pay | Admitting: Family Medicine

## 2018-04-05 ENCOUNTER — Other Ambulatory Visit: Payer: Self-pay | Admitting: Cardiovascular Disease

## 2018-04-07 MED ORDER — ESZOPICLONE 3 MG PO TABS: ORAL_TABLET | ORAL | 3 refills | Status: DC

## 2018-04-07 NOTE — Addendum Note (Signed)
Addended by: Jacqulynn Cadet on: 04/07/2018 08:30 AM   Modules accepted: Orders

## 2018-04-08 ENCOUNTER — Other Ambulatory Visit: Payer: Self-pay | Admitting: Cardiovascular Disease

## 2018-04-10 ENCOUNTER — Telehealth: Payer: Self-pay | Admitting: Cardiovascular Disease

## 2018-04-10 NOTE — Telephone Encounter (Signed)
[facility] calling for a refill, states they have sent 3 request in for a  Rx

## 2018-04-10 NOTE — Telephone Encounter (Signed)
Spoke pharmacy tech verbal  Refill for ezopiclone #30 x6   ( generic )

## 2018-04-25 ENCOUNTER — Encounter: Payer: Self-pay | Admitting: Family Medicine

## 2018-04-25 ENCOUNTER — Ambulatory Visit: Payer: Medicare Other | Admitting: Family Medicine

## 2018-04-25 VITALS — BP 120/70 | HR 67 | Temp 99.5°F | Resp 16 | Wt 107.8 lb

## 2018-04-25 DIAGNOSIS — R05 Cough: Secondary | ICD-10-CM

## 2018-04-25 DIAGNOSIS — J069 Acute upper respiratory infection, unspecified: Secondary | ICD-10-CM | POA: Diagnosis not present

## 2018-04-25 DIAGNOSIS — R058 Other specified cough: Secondary | ICD-10-CM

## 2018-04-25 MED ORDER — AZITHROMYCIN 250 MG PO TABS: ORAL_TABLET | ORAL | 0 refills | Status: DC

## 2018-04-25 MED ORDER — PREDNISONE 10 MG (21) PO TBPK: ORAL_TABLET | Freq: Every day | ORAL | 0 refills | Status: DC

## 2018-04-25 MED ORDER — PROMETHAZINE-DM 6.25-15 MG/5ML PO SYRP: 5.0000 mL | ORAL_SOLUTION | Freq: Every evening | ORAL | 0 refills | Status: DC | PRN

## 2018-04-25 NOTE — Progress Notes (Signed)
Chief Complaint  Patient presents with  . bad cough    bad cough, since last saturday. coughing up green stuff, trouble sleeping. headache with coughing,     Subjective:  Nicole Allen is a 67 y.o. female who presents for a one week history of dry cough, sore throat, fever, nausea, fatigue.  Cough is more productive now of dark green sputum. States she is miserable and tried multiple OTC therapies.   She initially thought symptoms were related to allergies. History of pneumonia per patient.  No recent antibiotics. Does not smoke.   Denies dizziness, ear pain, chest pain, back pain, palpitations, shortness of breath, wheezing, abdominal pain, vomiting, diarrhea.   Treatment to date: antihistamines, cough suppressants, decongestants and cough expectorant.  Denies sick contacts.  No other aggravating or relieving factors.  No other c/o.  ROS as in subjective.   Objective: Vitals:   04/25/18 1017  BP: 120/70  Pulse: 67  Resp: 16  Temp: 99.5 F (37.5 C)  SpO2: 98%    General appearance: Alert, WD/WN, no distress, mildly ill appearing                             Skin: warm, no rash                           Head: no sinus tenderness                            Eyes: conjunctiva normal, corneas clear, PERRLA                            Ears: pearly TMs, external ear canals normal                          Nose: septum midline, turbinates swollen, with erythema and thick discharge             Mouth/throat: MMM, tongue normal, mild pharyngeal erythema                           Neck: supple, no adenopathy, no thyromegaly, nontender                          Heart: RRR, normal S1, S2, no murmurs                         Lungs: CTA bilaterally, no wheezes, rales, or rhonchi      Assessment: Productive cough - Plan: azithromycin (ZITHROMAX Z-PAK) 250 MG tablet, promethazine-dextromethorphan (PROMETHAZINE-DM) 6.25-15 MG/5ML syrup  Acute URI    Plan: Discussed diagnosis and  treatment of productive cough. Z-pak and Promethazine prescribed. States Nicole Allen has not worked in the past. Has needed steroids in the past but no that bad yet. Prednisone dose pak sent to pharmacy in case she worsens over the weekend otherwise advised her to not take it.  Suggested symptomatic OTC remedies. Guaifenesin during the day, stay well hydrated. Nasal saline spray for congestion.  Tylenol or Ibuprofen OTC for fever and malaise.  Call/return if worsening or not back to baseline in 10 days.

## 2018-04-25 NOTE — Patient Instructions (Signed)
Take the antibiotic as prescribed. It will keep working for an additional 5 days after completing it.  Rest and stay well hydrated.  Take guaifenesin during the day and Promethazine DM at bedtime. This is sedating.   If you are worsening over the weekend, you may start the steroid dose pak.   Follow up if you are not back to baseline in 10 days.

## 2018-04-26 ENCOUNTER — Encounter (HOSPITAL_BASED_OUTPATIENT_CLINIC_OR_DEPARTMENT_OTHER): Payer: Self-pay

## 2018-05-06 ENCOUNTER — Ambulatory Visit (HOSPITAL_BASED_OUTPATIENT_CLINIC_OR_DEPARTMENT_OTHER): Payer: Medicare Other | Attending: Cardiovascular Disease | Admitting: Cardiovascular Disease

## 2018-05-06 VITALS — Ht 59.0 in | Wt 110.0 lb

## 2018-05-06 DIAGNOSIS — G4733 Obstructive sleep apnea (adult) (pediatric): Secondary | ICD-10-CM | POA: Insufficient documentation

## 2018-05-07 ENCOUNTER — Other Ambulatory Visit: Payer: Self-pay | Admitting: Family Medicine

## 2018-05-07 DIAGNOSIS — K219 Gastro-esophageal reflux disease without esophagitis: Secondary | ICD-10-CM

## 2018-05-09 ENCOUNTER — Other Ambulatory Visit: Payer: Self-pay | Admitting: Family Medicine

## 2018-05-09 ENCOUNTER — Telehealth: Payer: Self-pay | Admitting: *Deleted

## 2018-05-09 DIAGNOSIS — F988 Other specified behavioral and emotional disorders with onset usually occurring in childhood and adolescence: Secondary | ICD-10-CM

## 2018-05-09 NOTE — Telephone Encounter (Signed)
Patient would like to be informed when prescription is filled.

## 2018-05-09 NOTE — Telephone Encounter (Signed)
Patient called back stating that she had some issues with her sleep study and would like to keep her 05/19/18 appointment. Appointment added back to schedule. 08/14/18 appointment canceled.

## 2018-05-09 NOTE — Telephone Encounter (Signed)
Left message due to her just getting sleep study appointment has been rescheduled from 05/19/18 to 08/14/18. Call back if questions.

## 2018-05-11 ENCOUNTER — Encounter (HOSPITAL_BASED_OUTPATIENT_CLINIC_OR_DEPARTMENT_OTHER): Payer: Self-pay | Admitting: Cardiovascular Disease

## 2018-05-11 NOTE — Procedures (Signed)
Patient Name: Nicole, Allen Date: 05/06/2018 Gender: Female D.O.B: [birth year] Age (years): 13 Referring Provider: Shelva Majestic MD, ABSM Height (inches): 59 Interpreting Physician: Shelva Majestic MD, ABSM Weight (lbs): 110 RPSGT: Carolin Coy BMI: 22 MRN: [id number] Neck Size: 12.00  CLINICAL INFORMATION The patient is referred for a CPAP titration to treat sleep apnea.  Date of NPSG:  02/25/2018:  AHI 9.6/h; RDI 16.9/h; supine AHI 16.9/h  SLEEP STUDY TECHNIQUE As per the AASM Manual for the Scoring of Sleep and Associated Events v2.3 (April 2016) with a hypopnea requiring 4% desaturations.  The channels recorded and monitored were frontal, central and occipital EEG, electrooculogram (EOG), submentalis EMG (chin), nasal and oral airflow, thoracic and abdominal wall motion, anterior tibialis EMG, snore microphone, electrocardiogram, and pulse oximetry. Continuous positive airway pressure (CPAP) was initiated at the beginning of the study and titrated to treat sleep-disordered breathing.  MEDICATIONS     amphetamine-dextroamphetamine (ADDERALL XR) 30 MG 24 hr capsule         aspirin 81 MG tablet         azithromycin (ZITHROMAX Z-PAK) 250 MG tablet         cetirizine (ZYRTEC) 10 MG tablet         Coenzyme Q10 (COQ10 PO)         Eszopiclone 3 MG TABS         fluticasone (FLONASE) 50 MCG/ACT nasal spray         Multiple Vitamin (MULTIVITAMIN) tablet         mupirocin ointment (BACTROBAN) 2 %         omeprazole (PRILOSEC) 40 MG capsule         predniSONE (STERAPRED UNI-PAK 21 TAB) 10 MG (21) TBPK tablet         promethazine-dextromethorphan (PROMETHAZINE-DM) 6.25-15 MG/5ML syrup         simvastatin (ZOCOR) 10 MG tablet         SYNTHROID 50 MCG tablet         TURMERIC PO      Medications self-administered by patient taken the night of the study : SIMVASTATIN, FLONASE, LUNESTA, OMEPRAZOLE, BENADRYL, CETIRIZINE  TECHNICIAN COMMENTS Comments added by technician:  PATIENT WAS ORDERED AS A CPAP TITRATION. Patient had difficulty initiating sleep. Comments added by scorer: N/A  RESPIRATORY PARAMETERS Optimal PAP Pressure (cm): 11 AHI at Optimal Pressure (/hr): 0.0 Overall Minimal O2 (%): 92.0 Supine % at Optimal Pressure (%): 42 Minimal O2 at Optimal Pressure (%): 95.0   SLEEP ARCHITECTURE  The study was initiated at 10:45:16 PM and ended at 4:51:00 AM.  Sleep onset time was 109.4 minutes and the sleep efficiency was 34.9%%. The total sleep time was 127.5 minutes.  The patient spent 32.9%% of the night in stage N1 sleep, 64.7%% in stage N2 sleep, 0.0%% in stage N3 and 2.4% in REM.Stage REM latency was 144.0 minutes  Wake after sleep onset was 128.8. Alpha intrusion was absent. Supine sleep was 44.31%.  CARDIAC DATA The 2 lead EKG demonstrated sinus rhythm. The mean heart rate was 75.4 beats per minute. Other EKG findings include: None.  LEG MOVEMENT DATA The total Periodic Limb Movements of Sleep (PLMS) were 0. The PLMS index was 0.0. A PLMS index of <15 is considered normal in adults.  IMPRESSIONS - CPAP was initiated at 5 cm and was titrated to optimal pressure of 11 cm of water. AHI 0; O2 nadir 95%. - Central sleep apnea was not noted during this titration (CAI =  0.5/h). - No oxygen desaturations were observed during this titration (min O2 = 92.0%). - The patient snored with soft snoring volume during this titration study. - No cardiac abnormalities were observed during this study. - Clinically significant periodic limb movements were not noted during this study. Arousals associated with PLMs were rare.  DIAGNOSIS - Obstructive Sleep Apnea (327.23 [G47.33 ICD-10])  RECOMMENDATIONS - Recommend an initial trial of CPAP therapy with EPR of 3 at 11 cm H2O with heated humidification.  A Small size Resmed Full Face Mask AirFit F30 mask was used for the titration. - Effort should be made to optimize nasal andoropharyngeal patency. - Avoid  alcohol, sedatives and other CNS depressants that may worsen sleep apnea and disrupt normal sleep architecture. - Sleep hygiene should be reviewed to assess factors that may improve sleep quality. - Weight management and regular exercise should be initiated or continued. - Recommend a download in 30 days and sleep clinic evaluation after 4 weeks of therapy. -   [Electronically signed] 05/11/2018 08:32 PM  Shelva Majestic MD, [facility], [place], American Board of Sleep Medicine   NPI: [id number]  [place] PH: [phone]   FX: [phone] [place]

## 2018-05-12 NOTE — Telephone Encounter (Signed)
Patient prefers to have appointment before ordering CPAP. She has appointment scheduled for 05/19/18 sleep clinic.

## 2018-05-19 ENCOUNTER — Encounter: Payer: Self-pay | Admitting: Cardiovascular Disease

## 2018-05-19 ENCOUNTER — Ambulatory Visit: Payer: Medicare Other | Admitting: Cardiovascular Disease

## 2018-05-19 VITALS — BP 101/60 | HR 78 | Resp 16 | Ht 59.0 in | Wt 105.0 lb

## 2018-05-19 DIAGNOSIS — R5383 Other fatigue: Secondary | ICD-10-CM | POA: Diagnosis not present

## 2018-05-19 DIAGNOSIS — E039 Hypothyroidism, unspecified: Secondary | ICD-10-CM | POA: Diagnosis not present

## 2018-05-19 DIAGNOSIS — G4733 Obstructive sleep apnea (adult) (pediatric): Secondary | ICD-10-CM

## 2018-05-19 DIAGNOSIS — E785 Hyperlipidemia, unspecified: Secondary | ICD-10-CM

## 2018-05-19 NOTE — Progress Notes (Signed)
Cardiology Office Note    Date:  05/19/2018   ID:  Nicole Allen, DOB [birth year], MRN [id number]  PCP:  Rita Ohara, MD  Cardiologist:  Shelva Majestic, MD   Chief Complaint  Patient presents with  . Sleeping Problem   Follow-up evaluation  History of Present Illness:  Nicole Allen is a 67 y.o. female who had undergone a sleep study interpreted by [name] in August 2017.  The patient  continued to have significant difficulty with sleep and is was referred by [name] for sleep evaluation and possible restless legs.  I saw her for initial evaluation on 04/16/2017, and last saw her in July 2018.  She underwent repeat  sleep studies and presents for reevaluation  Nicole Allen has a history of hypothyroidism, limb movements of sleep, attention deficit disorder, hyperlipidemia, and GERD.  She was having significant difficulty with sleep with some insomnia and ultimately was referred for sleep study in 02/24/2016 which was interpreted by Dr. Annamaria Boots.  I personally reviewed this sleep study in detail.  The patient had reduced sleep efficiency at 51.7%.  She had markedly prolonged rim latency at 266 minutes.  She also had significant reduction in rems sleep at only 4%.  Wake after sleep onset was 137 minutes.  She was found to have an overall AHI of 4.5 per hour.  Do not see any mention of the respiratory disturbance index, but I suspect this was increased based on at least 17 respiratory effort related arousals.  AHI while sleeping supine, was 11.7 and consistent with mild sleep apnea.  She had moderate snoring.  There was no significant oxygen desaturation.  She had slight increase in periodic limb movements of sleep at 51, resulting in index of 16.36.  Associated arousals with leg movement index was 5.8. When I initially saw her, she the patient denied any urge to move or painful restless legs , and her symptoms were not suggestive of restless leg syndrome.  Reportedly, she was told to have  a trial of Mirapex by [name], which she never did.  She had difficulty with sleep initiation and maintenance.  When I initially saw her she was having difficulty particularly with sleep initiation and maintenance.  She typically goes to bed between 10 and 11 PM at night, but may stay awake for several hours before she falls asleep.  However, if she stays up until 1 AM and tries to go to sleep then she typically falls asleep in about 30 minutes.  She often wakes up between 8:30 AM and 10 AM.  She notes frequent awakenings.  She was given a prescription for trazodone for sleep, which has not been very helpful.  She did not tolerate Ambien in the past.  She was complaining of being tired.  When the day and was at a point where she not feel safe driving toward the daytime since she may fall a sleep.   When I saw her, I recommended a trial of Lunesta 2 mg, which would be helpful both for sleep initiation and maintenance.  She had issues with her insurance company covering this, but ultimately she instituted this therapy.  I referred her for follow-up sleep study which was done on 06/08/2017.  Unfortunately, this was the night of the snowstorm.  She did not have a good experience in the lab, particularly with reference to the sleep tech.  She often sleeps with a sound machine and the tech would not let her use  it as result, she was very anxious and again had difficulty with sleep initiation.  Of note, since starting the Lunesta at home, she states that this has been very effective 90% of the time and allowing her to fall sleep within 30 minutes and usually sleep duration and maintenance was significantly improved.  Unfortunately, the night of the sleep study because of difficulty with sleep initiation, a cold environment, and not feeling comfortable with tech sleep efficiency was poor at 51.4%.  Wake after sleep onset was 161 minutes.  Her AHIwas only minimally increased at 5.4 per hour with an RDI of 6.9 per hour.   There was a positional component with supine sleep, AHI of 11.6.  She was unable to achieve rems sleep.  In contrast, since she had been on Lunesta at home and was sleeping for longer duration.  She had noticed over the past month that she was dreaming.  Interestingly, she feels that since she is started [name].  She is not moving her legs were feeling this jittery sensation.  In contrast to her previous sleep study, on her most recent sleep study she did not have any periodic limb movement disorder of sleep.  She continues to take Aldderiall XR for her ADD.  She is on omeprazole for GERD.  She takes simvastatin 10 mg at bedtime for hyperlipidemia.  She also is on levothyroxine 50 g.    At her office visit on December 17,2018 I further titrated Lunesta to 3 mg daily.  She believes that she is sleeping and dreaming well.  However she continues to experience significant fatigue.  She typically goes to bed at 11 PM.  She often reads a Kindl before going to sleep.    As I last saw her, she underwent repeat sleep evaluation and had a diagnostic polysomnogram on February 25, 2018.  AHI was 9.6/h, RDI 16.9/h.  AHI during supine sleep was 60.9/h.  Minimum oxygen saturation was 89%.  He underwent a CPAP titration trial on May 06, 2018 and was titrated up to 11 cm water pressure.  AHI at 11 cm was 0 with an oxygen nadir of 95%.  During that study, he had taken her almost 2 hours to fall asleep and this was predominantly due to a sense of cold air on her upper lip.  Apparently she was given a full facemask for her titration trial.  She wanted to further discuss CPAP prior to implementing treatment and she presents for evaluation.  Past Medical History:  Diagnosis Date  . ADD (attention deficit disorder)   . Allergy    SEASONAL  . BCC (basal cell carcinoma of skin)    chest; Dr. Tonia Brooms  . BCC (basal cell carcinoma)    Tonia Brooms  . Cervical spondylosis   . Hearing loss    high frequency (Dr. Prescott Parma); hearing  aids recommended; bilateral  . History of kidney stones    x3  . Hypothyroidism 2015  . Insomnia    worse with menopause  . Kidney stone    x3, Dr. Reece Agar  . Osteopenia   . Postmenopausal    on HRT--sees GYN  . Pure hypercholesterolemia     Past Surgical History:  Procedure Laterality Date  . CATARACT EXTRACTION Bilateral L 10/18/15 R 12/13/15   Dr. Herbert Deaner  . CESAREAN SECTION  [id number]  . COLONOSCOPY  2003, 2013   Dr. Collene Mares  . HYMENECTOMY  1993  . KNEE SURGERY Right 09/29/2014   Dr. Jim Desanctis replacement/reconstruction  . TONSILLECTOMY  AND ADENOIDECTOMY    . TUBAL LIGATION  1991    Current Medications: Outpatient Medications Prior to Visit  Medication Sig Dispense Refill  . amphetamine-dextroamphetamine (ADDERALL XR) 30 MG 24 hr capsule TAKE (1) CAPSULE DAILY IN THE MORNING. 90 capsule 0  . cetirizine (ZYRTEC) 10 MG tablet Take 10 mg by mouth daily.    . Coenzyme Q10 (COQ10 PO) Take 1 tablet by mouth daily.    . Eszopiclone 3 MG TABS TAKE 1 TABLET IMMEDIATELY BEFORE BEDTIME AS NEEDED. 30 tablet 3  . fluticasone (FLONASE) 50 MCG/ACT nasal spray USE 2 SPRAYS IN EACH NOSTRIL ONCE A DAY. 16 g 5  . mupirocin ointment (BACTROBAN) 2 % Place 1 application into the nose as directed.    Marland Kitchen omeprazole (PRILOSEC) 40 MG capsule TAKE 1 CAPSULE EVERY DAY. 90 capsule 0  . simvastatin (ZOCOR) 10 MG tablet Take 1 tablet (10 mg total) by mouth at bedtime. 90 tablet 3  . SYNTHROID 50 MCG tablet TAKE 1 TABLET ONCE DAILY BEFORE BREAKFAST. 90 tablet 1  . TURMERIC PO Take by mouth daily.    Marland Kitchen aspirin 81 MG tablet Take 81 mg by mouth daily.    Marland Kitchen azithromycin (ZITHROMAX Z-PAK) 250 MG tablet Take 2 tables today, then 1 tablet on remaining days. 6 each 0  . Multiple Vitamin (MULTIVITAMIN) tablet Take 1 tablet by mouth daily.    . predniSONE (STERAPRED UNI-PAK 21 TAB) 10 MG (21) TBPK tablet Take by mouth daily. Use as manufacturer recommends 21 tablet 0  . promethazine-dextromethorphan  (PROMETHAZINE-DM) 6.25-15 MG/5ML syrup Take 5 mLs by mouth at bedtime as needed for cough. 118 mL 0   No facility-administered medications prior to visit.      Allergies:   Morphine and related and Codeine   Social History   Socioeconomic History  . Marital status: Married    Spouse name: Not on file  . Number of children: 2  . Years of education: Not on file  . Highest education level: Not on file  Occupational History  . Occupation: retired Teaching laboratory technician  Social Needs  . Financial resource strain: Not on file  . Food insecurity:    Worry: Not on file    Inability: Not on file  . Transportation needs:    Medical: Not on file    Non-medical: Not on file  Tobacco Use  . Smoking status: Never Smoker  . Smokeless tobacco: Never Used  Substance and Sexual Activity  . Alcohol use: Yes    Alcohol/week: 0.0 standard drinks    Comment: 5-7/week (2 drinks on the weekends, occasional glass during the week once or twice).  . Drug use: No  . Sexual activity: Yes    Partners: Male  Lifestyle  . Physical activity:    Days per week: Not on file    Minutes per session: Not on file  . Stress: Not on file  Relationships  . Social connections:    Talks on phone: Not on file    Gets together: Not on file    Attends religious service: Not on file    Active member of club or organization: Not on file    Attends meetings of clubs or organizations: Not on file    Relationship status: Not on file  Other Topics Concern  . Not on file  Social History Narrative   Lives with husband.  Son in [place], [place] in [place], Daughter is in [place]. Grandson (born 04/2015, in [place]).   Son with  alcohol issues (and arrested for domestic violence summer/fall 2017)     Additional social history is notable that she was born in [place], [place].  She is a Engineer, water by trade.  She has ADD.  There is no tobacco history.  Family History:  The patient's family history includes ADD / ADHD in her  daughter and son; Alzheimer's disease in her maternal grandmother; Arthritis in her mother; Asthma in her mother; Cancer in her maternal grandfather and paternal grandfather; Heart disease in her father; Hyperlipidemia in her father and mother; Hypertension in her father and mother; Osteoporosis in her mother; Sleep apnea in her sister.   Her mother died at 8, father died at age 41 and had suffered a myocardial infarction.  She has one sister with obstructive sleep apnea and her father also had obstructive sleep apnea.  ROS General: Negative; No fevers, chills, or night sweats;  HEENT: Negative; No changes in vision or hearing, sinus congestion, difficulty swallowing Pulmonary: Negative; No cough, wheezing, shortness of breath, hemoptysis Cardiovascular: Negative; No chest pain, presyncope, syncope, palpitations GI: Negative; No nausea, vomiting, diarrhea, or abdominal pain GU: Negative; No dysuria, hematuria, or difficulty voiding Musculoskeletal: Negative; no myalgias, joint pain, or weakness Hematologic/Oncology: Negative; no easy bruising, bleeding Endocrine: Negative; no heat/cold intolerance; no diabetes Neuro: Negative; no changes in balance, headaches Skin: Negative; No rashes or skin lesions Psychiatric: Negative; No behavioral problems, depression Sleep: Positive for insomnia, snoring, fatigability, limb movement disorder of sleep, no hypnogognic hallucinations, no cataplexy Other comprehensive 14 point system review is negative.   PHYSICAL EXAM:   VS:  BP 101/60   Pulse 78   Resp 16   Ht 4' 11"  (1.499 m)   Wt 105 lb (47.6 kg)   SpO2 100%   BMI 21.21 kg/m     Wt Readings from Last 3 Encounters:  05/19/18 105 lb (47.6 kg)  05/06/18 110 lb (49.9 kg)  04/25/18 107 lb 12.8 oz (48.9 kg)    General: Alert, oriented, no distress.  Skin: normal turgor, no rashes, warm and dry HEENT: Normocephalic, atraumatic. Pupils equal round and reactive to light; sclera anicteric;  extraocular muscles intact;  Nose without nasal septal hypertrophy Mouth/Parynx benign; Mallinpatti scale 2 Neck: No JVD, no carotid bruits; normal carotid upstroke Lungs: clear to ausculatation and percussion; no wheezing or rales Chest wall: without tenderness to palpitation Heart: PMI not displaced, RRR, s1 s2 normal, 1/6 systolic murmur, no diastolic murmur, no rubs, gallops, thrills, or heaves Abdomen: soft, nontender; no hepatosplenomehaly, BS+; abdominal aorta nontender and not dilated by palpation. Back: no CVA tenderness Pulses 2+ Musculoskeletal: full range of motion, normal strength, no joint deformities Extremities: no clubbing cyanosis or edema, Homan's sign negative  Neurologic: grossly nonfocal; Cranial nerves grossly wnl Psychologic: Normal mood and affec    Studies/Labs Reviewed:   EKG:  EKG is ordered today. ECG (independently read by me): NSR with mild sinus arrythmia  July 2019 ECG (independently read by me): Sinus rhythm at 83 bpm.  No ectopy.  QTc interval 479 NicoleC  December 2018 ECG (independently read by me): Normal sinus rhythm at 67 bpm.  No ectopy.  QTC interval 467 ms.  Recent Labs: BMP Latest Ref Rng & Units 03/25/2018 09/12/2017 07/05/2016  Glucose 65 - 99 mg/dL 93 87 92  BUN 8 - 27 mg/dL 14 16 15   Creatinine 0.57 - 1.00 mg/dL 0.57 0.67 0.63  BUN/Creat Ratio 12 - 28 25 24  -  Sodium 134 - 144 mmol/L 142 140 138  Potassium 3.5 - 5.2 mmol/L 4.4 4.4 4.1  Chloride 96 - 106 mmol/L 102 101 105  CO2 20 - 29 mmol/L 23 27 25   Calcium 8.7 - 10.3 mg/dL 9.2 9.3 8.7     Hepatic Function Latest Ref Rng & Units 03/25/2018 09/12/2017 04/10/2017  Total Protein 6.0 - 8.5 g/dL 6.5 6.6 6.6  Albumin 3.6 - 4.8 g/dL 4.5 4.2 -  AST 0 - 40 IU/L 18 18 19   ALT 0 - 32 IU/L 14 14 11   Alk Phosphatase 39 - 117 IU/L 65 60 -  Total Bilirubin 0.0 - 1.2 mg/dL 0.3 0.2 0.4  Bilirubin, Direct 0.0 - 0.2 mg/dL - - 0.1    CBC Latest Ref Rng & Units 09/12/2017 10/08/2016 07/05/2016  WBC 3.4  - 10.8 x10E3/uL 5.8 5.7 4.9  Hemoglobin 11.1 - 15.9 g/dL 12.7 13.4 11.9  Hematocrit 34.0 - 46.6 % 37.3 40.0 35.6  Platelets 150 - 379 x10E3/uL 204 209 201   Lab Results  Component Value Date   MCV 87 09/12/2017   MCV 85.3 10/08/2016   MCV 88.3 07/05/2016   Lab Results  Component Value Date   TSH 3.380 09/12/2017   No results found for: HGBA1C   BNP No results found for: BNP  ProBNP No results found for: PROBNP   Lipid Panel     Component Value Date/Time   CHOL 236 (H) 03/25/2018 1417   TRIG 105 03/25/2018 1417   HDL 74 03/25/2018 1417   CHOLHDL 3.2 03/25/2018 1417   CHOLHDL 2.1 07/05/2016 0724   VLDL 21 07/05/2016 0724   LDLCALC 141 (H) 03/25/2018 1417     RADIOLOGY: No results found.   Additional studies/ records that were reviewed today include:  I reviewed the patient's previous sleep studies and in particular the most recent June 08, 2017 evaluation.    ASSESSMENT:    1. Obstructive sleep apnea (adult) (pediatric)   2. Fatigue, unspecified type   3. Hypothyroidism, unspecified type   4. 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  She also is a history of hypothyroidism which has been stable on levothyroxine 50 g.  She has attention deficit disorder and takes adderall XR  30 mg every morning.  .  She has had issues with sleep initiation and sleep maintenance.  On her initial sleep study she was found to have very mild sleep apnea, unspecified type and was found to have significant periodic limb movement disorder of sleep with a PLM asked index of 16.4.  Since institution of Lunesta, she has felt improved with reference to sleep initiation and maintenance and also had noticed improvements in her periodic limb movements of sleep.  On her initial sleep study in December 2018 she did not  develop any REM sleep and had mild overall sleep apnea with an AHI 5.4 and RDI of 6.9 with slight increase in sleep apnea with supine posture.  On follow-up assessment and August 2019 overall AHI was 9.6/h with an RDI of 16.9/h.  She had moderate sleep apnea with supine sleep with an AHI of 16.9/h.  I reviewed her CPAP titration study and she was titrated up to 11 cm.  AHI was excellent at 0 with treatment.  Apparently she was uncertain if she could tolerate CPAP due to the cold air that she felt  on the top of her lip.  I suspect she may not need a full facemask.  I again discussed the benefits of CPAP therapy and also discussed alternatives including customized oral appliance.  She would now like to consider CPAP therapy.  Prescribed ResMed air sense 10 CPAP auto unit and will set an initial pressure of 6 cm with titration up to 14 cm if needed.  I also discussed heated humidification which should improve her sensation of cold air.  I think she would do well with a nasal rather than full facemask and would suggest either a Respironics DreamWear or a ResMed NP 30i trial.  What is necessary for her to meet compliance.  She will be going to [place] at the very end of January and compliance will need to be verified prior to her trip.  I discussed  using CPAP for the entire duration of sleep.  Her pressure today is stable.  ECG shows sinus rhythm with minimal sinus arrhythmia.  I will see her in several months for follow-up evaluation.  She is followed by [name] for primary care.  Most recent lipid studies show an increased LDL.  I would recommend more aggressive lipid-lowering therapy attained at least to target LDL less than 100.  Would recommend follow-up evaluation with [name] with consideration for medication adjustment.    Medication Adjustments/Labs and Tests Ordered: Current medicines are reviewed at length with the patient today.  Concerns regarding medicines are outlined above.  Medication  changes, Labs and Tests ordered today are listed in the Patient Instructions below. Patient Instructions  Medication Instructions:  Your physician recommends that you continue on your current medications as directed. Please refer to the Current Medication list given to you today.   Follow-Up: 3 months with Dr. Claiborne Billings (sleep clinic)    We will send order to [facility] for CPAP initiation    Signed, Shelva Majestic, MD  05/19/2018 8:53 AM    [facility] [address], [place], [place]  [address] Phone: [phone]

## 2018-05-19 NOTE — Patient Instructions (Addendum)
Medication Instructions:  Your physician recommends that you continue on your current medications as directed. Please refer to the Current Medication list given to you today.   Follow-Up: 3 months with Dr. Claiborne Billings (sleep clinic)    We will send order to [facility] for CPAP initiation

## 2018-07-08 ENCOUNTER — Encounter: Payer: Self-pay | Admitting: Obstetrics & Gynecology

## 2018-07-08 ENCOUNTER — Ambulatory Visit: Payer: Medicare Other | Admitting: Obstetrics & Gynecology

## 2018-07-08 VITALS — BP 120/74 | Ht 58.5 in | Wt 106.0 lb

## 2018-07-08 DIAGNOSIS — Z01419 Encounter for gynecological examination (general) (routine) without abnormal findings: Secondary | ICD-10-CM

## 2018-07-08 DIAGNOSIS — M8589 Other specified disorders of bone density and structure, multiple sites: Secondary | ICD-10-CM | POA: Diagnosis not present

## 2018-07-08 DIAGNOSIS — N952 Postmenopausal atrophic vaginitis: Secondary | ICD-10-CM | POA: Diagnosis not present

## 2018-07-08 DIAGNOSIS — Z780 Asymptomatic menopausal state: Secondary | ICD-10-CM

## 2018-07-08 MED ORDER — ESTROGENS, CONJUGATED 0.625 MG/GM VA CREA: 0.2500 | TOPICAL_CREAM | VAGINAL | 3 refills | Status: DC

## 2018-07-08 NOTE — Patient Instructions (Signed)
1. Encounter for routine gynecological examination with Papanicolaou smear of cervix Normal gynecologic exam in menopause.  Pap reflex done.  Breast exam normal.  Will schedule screening mammogram at [facility].  Colonoscopy in 2013.  Health labs with family physician.  Body mass index 21.78.  Recommend resuming regular walking.  2. Postmenopausal Well on no hormone replacement therapy.  No postmenopausal bleeding.  3. Postmenopausal atrophic vaginitis Decision to start on Premarin cream.  No contraindication.  Will use a quarter of an applicator twice weekly with a thin layer on the vulva at the vaginal entrance.  Usage reviewed and prescription sent to pharmacy.  4. Osteopenia of multiple sites Referral for bone density at [facility] given.  Will schedule now.  Vitamin D supplements, calcium intake of 1500 mg daily and regular weightbearing physical activities recommended.  Other orders - conjugated estrogens (PREMARIN) vaginal cream; Place 5.10 Applicatorfuls vaginally 2 (two) times a week. Thin application on vulva twice weekly.  Beth, it was a pleasure meeting you today!  I will inform you of your results as soon as they are available.

## 2018-07-08 NOTE — Progress Notes (Signed)
Nicole Allen [birth year] [id number]   History:    68 y.o. G2P2L2 Married  RP: New  patient presenting for annual gyn exam   HPI: Menopause, well on no hormone replacement therapy.  No postmenopausal bleeding.  No pelvic pain.  Sexually active, but no intercourse because too painful due to dryness.  Tried coconut oil but was not sufficient.  Breasts normal.  Urine and bowel movements normal.  Body mass index 21.78.  Sleep disorder, much improved with CPAP.  Health labs with family physician.  Past medical history,surgical history, family history and social history were all reviewed and documented in the EPIC chart.  Gynecologic History No LMP recorded. Patient is postmenopausal. Contraception: post menopausal status Last Pap: 2017. Results were: Normal Last mammogram: 07/2016. Results were: Negative Bone Density: 07/2016 Osteopenia T-Score -2.1 Colonoscopy: 2013  Obstetric History OB History  Gravida Para Term Preterm AB Living  2 2       2   SAB TAB Ectopic Multiple Live Births               # Outcome Date GA Lbr Len/2nd Weight Sex Delivery Anes PTL Lv  2 Para           1 Para              ROS: A ROS was performed and pertinent positives and negatives are included in the history.  GENERAL: No fevers or chills. HEENT: No change in vision, no earache, sore throat or sinus congestion. NECK: No pain or stiffness. CARDIOVASCULAR: No chest pain or pressure. No palpitations. PULMONARY: No shortness of breath, cough or wheeze. GASTROINTESTINAL: No abdominal pain, nausea, vomiting or diarrhea, melena or bright red blood per rectum. GENITOURINARY: No urinary frequency, urgency, hesitancy or dysuria. MUSCULOSKELETAL: No joint or muscle pain, no back pain, no recent trauma. DERMATOLOGIC: No rash, no itching, no lesions. ENDOCRINE: No polyuria, polydipsia, no heat or cold intolerance. No recent change in weight. HEMATOLOGICAL: No anemia or easy bruising or bleeding. NEUROLOGIC: No headache,  seizures, numbness, tingling or weakness. PSYCHIATRIC: No depression, no loss of interest in normal activity or change in sleep pattern.     Exam:   BP 120/74   Ht 4' 10.5" (1.486 m)   Wt 106 lb (48.1 kg)   BMI 21.78 kg/m   Body mass index is 21.78 kg/m.  General appearance : Well developed well nourished female. No acute distress HEENT: Eyes: no retinal hemorrhage or exudates,  Neck supple, trachea midline, no carotid bruits, no thyroidmegaly Lungs: Clear to auscultation, no rhonchi or wheezes, or rib retractions  Heart: Regular rate and rhythm, no murmurs or gallops Breast:Examined in sitting and supine position were symmetrical in appearance, no palpable masses or tenderness,  no skin retraction, no nipple inversion, no nipple discharge, no skin discoloration, no axillary or supraclavicular lymphadenopathy Abdomen: no palpable masses or tenderness, no rebound or guarding Extremities: no edema or skin discoloration or tenderness  Pelvic: Vulva: Normal             Vagina: No gross lesions or discharge  Cervix: No gross lesions or discharge.  Pap reflex done  Uterus  AV, normal size, shape and consistency, non-tender and mobile  Adnexa  Without masses or tenderness  Anus: Normal   Assessment/Plan:  68 y.o. female for annual exam   1. Encounter for routine gynecological examination with Papanicolaou smear of cervix Normal gynecologic exam in menopause.  Pap reflex done.  Breast exam normal.  Will schedule  screening mammogram at [facility].  Colonoscopy in 2013.  Health labs with family physician.  Body mass index 21.78.  Recommend resuming regular walking.  2. Postmenopausal Well on no hormone replacement therapy.  No postmenopausal bleeding.  3. Postmenopausal atrophic vaginitis Decision to start on Premarin cream.  No contraindication.  Will use a quarter of an applicator twice weekly with a thin layer on the vulva at the vaginal entrance.  Usage reviewed and prescription sent to  pharmacy.  4. Osteopenia of multiple sites Referral for bone density at [facility] given.  Will schedule now.  Vitamin D supplements, calcium intake of 1500 mg daily and regular weightbearing physical activities recommended.  Other orders - conjugated estrogens (PREMARIN) vaginal cream; Place 1.10 Applicatorfuls vaginally 2 (two) times a week. Thin application on vulva twice weekly.  Princess Bruins MD, 2:57 PM 07/08/2018

## 2018-07-09 NOTE — Addendum Note (Signed)
Addended by: Thurnell Garbe A on: 07/09/2018 09:28 AM   Modules accepted: Orders

## 2018-07-11 LAB — PAP IG W/ RFLX HPV ASCU

## 2018-07-28 ENCOUNTER — Other Ambulatory Visit: Payer: Self-pay | Admitting: Cardiovascular Disease

## 2018-07-28 NOTE — Telephone Encounter (Signed)
SPOKE TO PHARMACY- PATIEN HAD RX FILLED 07/08/18 AND HAS 2 REFILLS LEFT.  THIS AUTHORIZATION WAS VERBAL CANCELLED

## 2018-07-28 NOTE — Telephone Encounter (Signed)
REFILLED  ONE MONTH , APPOINTMENT 2 /2020

## 2018-07-28 NOTE — Telephone Encounter (Signed)
Please advise if ok to refill. 

## 2018-07-29 ENCOUNTER — Other Ambulatory Visit: Payer: Self-pay | Admitting: Cardiovascular Disease

## 2018-07-30 ENCOUNTER — Telehealth: Payer: Self-pay | Admitting: Cardiovascular Disease

## 2018-07-30 NOTE — Telephone Encounter (Signed)
New Message     Pt c/o medication issue:  1. Name of Medication: Lunesta 3mg   2. How are you currently taking this medication (dosage and times per day)? 3mg  once a day  3. Are you having a reaction (difficulty breathing--STAT)? No  4. What is your medication issue? Patient states she's going out of town and needs 6 extra pills so she can make it through the time she will be out of town, and the pharmacy states she needs a written prescription from the doctor to get the pills.

## 2018-07-30 NOTE — Telephone Encounter (Signed)
This is Dr. Kelly's pt. °

## 2018-07-30 NOTE — Telephone Encounter (Signed)
I sent them the fax back regarding okay to fill early due to travel.

## 2018-08-14 ENCOUNTER — Ambulatory Visit: Payer: Self-pay | Admitting: Cardiovascular Disease

## 2018-08-19 ENCOUNTER — Other Ambulatory Visit: Payer: Self-pay | Admitting: Family Medicine

## 2018-08-19 DIAGNOSIS — K219 Gastro-esophageal reflux disease without esophagitis: Secondary | ICD-10-CM

## 2018-08-25 ENCOUNTER — Ambulatory Visit: Payer: Medicare Other | Admitting: Cardiovascular Disease

## 2018-08-25 ENCOUNTER — Encounter: Payer: Self-pay | Admitting: Cardiovascular Disease

## 2018-08-25 VITALS — BP 122/66 | HR 71 | Ht 58.5 in | Wt 110.4 lb

## 2018-08-25 DIAGNOSIS — R0683 Snoring: Secondary | ICD-10-CM | POA: Diagnosis not present

## 2018-08-25 DIAGNOSIS — F909 Attention-deficit hyperactivity disorder, unspecified type: Secondary | ICD-10-CM | POA: Diagnosis not present

## 2018-08-25 DIAGNOSIS — G4733 Obstructive sleep apnea (adult) (pediatric): Secondary | ICD-10-CM

## 2018-08-25 DIAGNOSIS — E039 Hypothyroidism, unspecified: Secondary | ICD-10-CM

## 2018-08-25 DIAGNOSIS — E785 Hyperlipidemia, unspecified: Secondary | ICD-10-CM

## 2018-08-25 MED ORDER — ESZOPICLONE 3 MG PO TABS: ORAL_TABLET | ORAL | 3 refills | Status: DC

## 2018-08-25 NOTE — Progress Notes (Signed)
Cardiology Office Note    Date:  08/27/2018   ID:  Nicole Allen, DOB 06-Feb-1951, MRN [id number]  PCP:  Rita Ohara, MD  Cardiologist:  Shelva Majestic, MD   No chief complaint on file.  Follow-up evaluation  History of Present Illness:  Nicole Allen is a 68 y.o. female who had undergone a sleep study interpreted by [name] in August 2017.  The patient  continued to have significant difficulty with sleep and is was referred by [name] for sleep evaluation and possible restless legs.  I saw her for initial evaluation on 04/16/2017, and last saw her in November 2019. She presents for F/U evaluation after CPAP institution.  [name] has a history of hypothyroidism, limb movements of sleep, attention deficit disorder, hyperlipidemia, and GERD.  She was having significant difficulty with sleep with some insomnia and ultimately was referred for sleep study in 02/24/2016 which was interpreted by Dr. Annamaria Boots.  I personally reviewed this sleep study in detail.  The patient had reduced sleep efficiency at 51.7%.  She had markedly prolonged rim latency at 266 minutes.  She also had significant reduction in rems sleep at only 4%.  Wake after sleep onset was 137 minutes.  She was found to have an overall AHI of 4.5 per hour.  Do not see any mention of the respiratory disturbance index, but I suspect this was increased based on at least 17 respiratory effort related arousals.  AHI while sleeping supine, was 11.7 and consistent with mild sleep apnea.  She had moderate snoring.  There was no significant oxygen desaturation.  She had slight increase in periodic limb movements of sleep at 51, resulting in index of 16.36.  Associated arousals with leg movement index was 5.8. When I initially saw her, she the patient denied any urge to move or painful restless legs , and her symptoms were not suggestive of restless leg syndrome.  Reportedly, she was told to have a trial of Mirapex by [name], which she  never did.  She had difficulty with sleep initiation and maintenance.  When I initially saw her she was having difficulty particularly with sleep initiation and maintenance.  She typically goes to bed between 10 and 11 PM at night, but may stay awake for several hours before she falls asleep.  However, if she stays up until 1 AM and tries to go to sleep then she typically falls asleep in about 30 minutes.  She often wakes up between 8:30 AM and 10 AM.  She notes frequent awakenings.  She was given a prescription for trazodone for sleep, which has not been very helpful.  She did not tolerate Ambien in the past.  She was complaining of being tired.  When the day and was at a point where she not feel safe driving toward the daytime since she may fall a sleep.   When I saw her, I recommended a trial of Lunesta 2 mg, which would be helpful both for sleep initiation and maintenance.  She had issues with her insurance company covering this, but ultimately she instituted this therapy.  I referred her for follow-up sleep study which was done on 06/08/2017.  Unfortunately, this was the night of the snowstorm.  She did not have a good experience in the lab, particularly with reference to the sleep tech.  She often sleeps with a sound machine and the tech would not let her use it as result, she was very anxious and again  had difficulty with sleep initiation.  Of note, since starting the Lunesta at home, she states that this has been very effective 90% of the time and allowing her to fall sleep within 30 minutes and usually sleep duration and maintenance was significantly improved.  Unfortunately, the night of the sleep study because of difficulty with sleep initiation, a cold environment, and not feeling comfortable with tech sleep efficiency was poor at 51.4%.  Wake after sleep onset was 161 minutes.  Her AHIwas only minimally increased at 5.4 per hour with an RDI of 6.9 per hour.  There was a positional component with  supine sleep, AHI of 11.6.  She was unable to achieve rems sleep.  In contrast, since she had been on Lunesta at home and was sleeping for longer duration.  She had noticed over the past month that she was dreaming.  Interestingly, she feels that since she is started [name].  She is not moving her legs were feeling this jittery sensation.  In contrast to her previous sleep study, on her most recent sleep study she did not have any periodic limb movement disorder of sleep.  She continues to take Aldderiall XR for her ADD.  She is on omeprazole for GERD.  She takes simvastatin 10 mg at bedtime for hyperlipidemia.  She also is on levothyroxine 50 g.    At her office visit on December 17,2018 I further titrated Lunesta to 3 mg daily.  She believes that she is sleeping and dreaming well.  However she continues to experience significant fatigue.  She typically goes to bed at 11 PM.  She often reads a Kindl before going to sleep.    She underwent repeat sleep evaluation and had a diagnostic polysomnogram on February 25, 2018.  AHI was 9.6/h, RDI 16.9/h.  AHI during supine sleep was 60.9/h.  Minimum oxygen saturation was 89%.  He underwent a CPAP titration trial on May 06, 2018 and was titrated up to 11 cm water pressure.  AHI at 11 cm was 0 with an oxygen nadir of 95%.  During that study, he had taken her almost 2 hours to fall asleep and this was predominantly due to a sense of cold air on her upper lip.  Apparently she was given a full facemask for her titration trial.  She wanted to further discuss CPAP prior to implementing treatment and ice saw her May 19, 2018 for follow-up evaluation.  Over the past several months, [name] has been utilizing CPAP therapy.  She does note significant improvement in her energy level and feels better.  I obtained a download in the office today from July 26, 2018 through August 24, 2018.  She has excellent compliance and is averaging 7 hours and 45 minutes of use  per night.  She has been set at an auto pressure of 6-14.  AHI is 1.9.  There is no leak.  Her 95th percentile pressure is 10.2 with a maximum average pressure of 11.5.  Time she does note some intermittent stuffy nose and when this occurs the nasal treatment is not as smooth since she feels she may have to breathe at times from her mouth.  He denies chest pain PND orthopnea.  She denies residual daytime sleepiness.  She presents for reevaluation.  Past Medical History:  Diagnosis Date  . ADD (attention deficit disorder)   . Allergy    SEASONAL  . BCC (basal cell carcinoma of skin)    chest; Dr. Tonia Brooms  . BCC (  basal cell carcinoma)    Tonia Brooms  . Cervical spondylosis   . Hearing loss    high frequency (Dr. Prescott Parma); hearing aids recommended; bilateral  . History of kidney stones    x3  . Hypothyroidism 2015  . Insomnia    worse with menopause  . Kidney stone    x3, Dr. Reece Agar  . Osteopenia   . Postmenopausal    on HRT--sees GYN  . Pure hypercholesterolemia     Past Surgical History:  Procedure Laterality Date  . CATARACT EXTRACTION Bilateral L 10/18/15 R 12/13/15   Dr. Herbert Deaner  . CESAREAN SECTION  [id number]  . COLONOSCOPY  2003, 2013   Dr. Collene Mares  . HYMENECTOMY  1993  . KNEE SURGERY Right 09/29/2014   Dr. Jim Desanctis replacement/reconstruction  . TONSILLECTOMY AND ADENOIDECTOMY    . TUBAL LIGATION  1991    Current Medications: Outpatient Medications Prior to Visit  Medication Sig Dispense Refill  . amphetamine-dextroamphetamine (ADDERALL XR) 30 MG 24 hr capsule TAKE (1) CAPSULE DAILY IN THE MORNING. 90 capsule 0  . cetirizine (ZYRTEC) 10 MG tablet Take 10 mg by mouth daily.    . Coenzyme Q10 (COQ10 PO) Take 1 tablet by mouth daily.    Marland Kitchen conjugated estrogens (PREMARIN) vaginal cream Place 3.66 Applicatorfuls vaginally 2 (two) times a week. Thin application on vulva twice weekly. 42.5 g 3  . fluticasone (FLONASE) 50 MCG/ACT nasal spray USE 2 SPRAYS IN EACH NOSTRIL ONCE A DAY.  16 g 5  . mupirocin ointment (BACTROBAN) 2 % Place 1 application into the nose as directed.    Marland Kitchen omeprazole (PRILOSEC) 40 MG capsule TAKE 1 CAPSULE EVERY DAY. 90 capsule 0  . simvastatin (ZOCOR) 10 MG tablet Take 1 tablet (10 mg total) by mouth at bedtime. 90 tablet 3  . SYNTHROID 50 MCG tablet TAKE 1 TABLET ONCE DAILY BEFORE BREAKFAST. 90 tablet 1  . TURMERIC PO Take by mouth daily.    . Eszopiclone 3 MG TABS TAKE 1 TABLET IMMEDIATELY BEFORE BEDTIME AS NEEDED. 30 tablet 0   No facility-administered medications prior to visit.      Allergies:   Morphine and related and Codeine   Social History   Socioeconomic History  . Marital status: Married    Spouse name: Not on file  . Number of children: 2  . Years of education: Not on file  . Highest education level: Not on file  Occupational History  . Occupation: retired Teaching laboratory technician  Social Needs  . Financial resource strain: Not on file  . Food insecurity:    Worry: Not on file    Inability: Not on file  . Transportation needs:    Medical: Not on file    Non-medical: Not on file  Tobacco Use  . Smoking status: Never Smoker  . Smokeless tobacco: Never Used  Substance and Sexual Activity  . Alcohol use: Yes    Alcohol/week: 0.0 standard drinks    Comment: 5-7/week (2 drinks on the weekends, occasional glass during the week once or twice).  . Drug use: No  . Sexual activity: Yes    Partners: Male    Comment: 1st intercourse- 53, partners- 29, married- 20 yrs   Lifestyle  . Physical activity:    Days per week: Not on file    Minutes per session: Not on file  . Stress: Not on file  Relationships  . Social connections:    Talks on phone: Not on file    Gets together: Not  on file    Attends religious service: Not on file    Active member of club or organization: Not on file    Attends meetings of clubs or organizations: Not on file    Relationship status: Not on file  Other Topics Concern  . Not on file  Social  History Narrative   Lives with husband.  Son in [place], [place] in [place], Daughter is in [place]. Grandson (born 04/2015, in [place]).   Son with alcohol issues (and arrested for domestic violence summer/fall 2017)     Additional social history is notable that she was born in [place], [place].  She is a Engineer, water by trade.  She has ADD.  There is no tobacco history.  Family History:  The patient's family history includes ADD / ADHD in her daughter and son; Alzheimer's disease in her maternal grandmother; Arthritis in her mother; Asthma in her mother; Cancer in her maternal grandfather and paternal grandfather; Heart disease in her father; Hyperlipidemia in her father and mother; Hypertension in her father and mother; Osteoporosis in her mother; Sleep apnea in her sister.   Her mother died at 61, father died at age 30 and had suffered a myocardial infarction.  She has one sister with obstructive sleep apnea and her father also had obstructive sleep apnea.  ROS General: Negative; No fevers, chills, or night sweats;  HEENT: Negative; No changes in vision or hearing, sinus congestion, difficulty swallowing Pulmonary: Negative; No cough, wheezing, shortness of breath, hemoptysis Cardiovascular: Negative; No chest pain, presyncope, syncope, palpitations GI: Negative; No nausea, vomiting, diarrhea, or abdominal pain GU: Negative; No dysuria, hematuria, or difficulty voiding Musculoskeletal: Negative; no myalgias, joint pain, or weakness Hematologic/Oncology: Negative; no easy bruising, bleeding Endocrine: Negative; no heat/cold intolerance; no diabetes Neuro: Negative; no changes in balance, headaches Skin: Negative; No rashes or skin lesions Psychiatric: Negative; No behavioral problems, depression Sleep: Positive for obstructive sleep apnea, now on CPAP with significant improvement in prior snoring and fatigability. no hypnogognic hallucinations, no cataplexy Other comprehensive 14 point system  review is negative.   PHYSICAL EXAM:   VS:  BP 122/66   Pulse 71   Ht 4' 10.5" (1.486 m)   Wt 110 lb 6.4 oz (50.1 kg)   BMI 22.68 kg/m     Blood pressure by me 120/70  Wt Readings from Last 3 Encounters:  08/25/18 110 lb 6.4 oz (50.1 kg)  07/08/18 106 lb (48.1 kg)  05/19/18 105 lb (47.6 kg)     General: Alert, oriented, no distress.  Skin: normal turgor, no rashes, warm and dry HEENT: Normocephalic, atraumatic. Pupils equal round and reactive to light; sclera anicteric; extraocular muscles intact; Nose without nasal septal hypertrophy Mouth/Parynx benign; Mallinpatti scale 3 Neck: No JVD, no carotid bruits; normal carotid upstroke Lungs: clear to ausculatation and percussion; no wheezing or rales Chest wall: without tenderness to palpitation Heart: PMI not displaced, RRR, s1 s2 normal, 1/6 systolic murmur, no diastolic murmur, no rubs, gallops, thrills, or heaves Abdomen: soft, nontender; no hepatosplenomehaly, BS+; abdominal aorta nontender and not dilated by palpation. Back: no CVA tenderness Pulses 2+ Musculoskeletal: full range of motion, normal strength, no joint deformities Extremities: no clubbing cyanosis or edema, Homan's sign negative  Neurologic: grossly nonfocal; Cranial nerves grossly wnl Psychologic: Normal mood and affect   Studies/Labs Reviewed:   EKG:  EKG is ordered today. ECG (independently read by me): NSR at 71; normal intervals  05/19/2018 ECG (independently read by me): NSR with mild sinus arrythmia  July  2019 ECG (independently read by me): Sinus rhythm at 83 bpm.  No ectopy.  QTc interval 479 ms.C  December 2018 ECG (independently read by me): Normal sinus rhythm at 67 bpm.  No ectopy.  QTC interval 467 ms.  Recent Labs: BMP Latest Ref Rng & Units 03/25/2018 09/12/2017 07/05/2016  Glucose 65 - 99 mg/dL 93 87 92  BUN 8 - 27 mg/dL 14 16 15   Creatinine 0.57 - 1.00 mg/dL 0.57 0.67 0.63  BUN/Creat Ratio 12 - 28 25 24  -  Sodium 134 - 144 mmol/L 142  140 138  Potassium 3.5 - 5.2 mmol/L 4.4 4.4 4.1  Chloride 96 - 106 mmol/L 102 101 105  CO2 20 - 29 mmol/L 23 27 25   Calcium 8.7 - 10.3 mg/dL 9.2 9.3 8.7     Hepatic Function Latest Ref Rng & Units 03/25/2018 09/12/2017 04/10/2017  Total Protein 6.0 - 8.5 g/dL 6.5 6.6 6.6  Albumin 3.6 - 4.8 g/dL 4.5 4.2 -  AST 0 - 40 IU/L 18 18 19   ALT 0 - 32 IU/L 14 14 11   Alk Phosphatase 39 - 117 IU/L 65 60 -  Total Bilirubin 0.0 - 1.2 mg/dL 0.3 0.2 0.4  Bilirubin, Direct 0.0 - 0.2 mg/dL - - 0.1    CBC Latest Ref Rng & Units 09/12/2017 10/08/2016 07/05/2016  WBC 3.4 - 10.8 x10E3/uL 5.8 5.7 4.9  Hemoglobin 11.1 - 15.9 g/dL 12.7 13.4 11.9  Hematocrit 34.0 - 46.6 % 37.3 40.0 35.6  Platelets 150 - 379 x10E3/uL 204 209 201   Lab Results  Component Value Date   MCV 87 09/12/2017   MCV 85.3 10/08/2016   MCV 88.3 07/05/2016   Lab Results  Component Value Date   TSH 3.380 09/12/2017   No results found for: HGBA1C   BNP No results found for: BNP  ProBNP No results found for: PROBNP   Lipid Panel     Component Value Date/Time   CHOL 236 (H) 03/25/2018 1417   TRIG 105 03/25/2018 1417   HDL 74 03/25/2018 1417   CHOLHDL 3.2 03/25/2018 1417   CHOLHDL 2.1 07/05/2016 0724   VLDL 21 07/05/2016 0724   LDLCALC 141 (H) 03/25/2018 1417     RADIOLOGY: No results found.   Additional studies/ records that were reviewed today include:  I reviewed the patient's previous sleep studies.  A new download was obtained in the office today from January 25 through August 24, 2018.  ASSESSMENT:      PLAN:  Ms. 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  She haws a history of hypothyroidism which has been stable on levothyroxine 50 g.  She has attention deficit disorder and takes adderall XR  30 mg every morning.  .  She has had issues with sleep initiation and sleep maintenance.  On  her initial sleep study she was found to have very mild sleep apnea, unspecified type and was found to have significant periodic limb movement disorder of sleep with a PLM asked index of 16.4.  Since institution of Lunesta, she has felt improved with reference to sleep initiation and maintenance and also had noticed improvements in her periodic limb movements of sleep.  On her initial sleep study in December 2018 she did not develop any REM sleep and had mild overall sleep apnea with an AHI 5.4 and RDI of 6.9 with slight increase in sleep apnea with  supine posture.  On follow-up assessment in  August 2019 overall AHI was 9.6/h with an RDI of 16.9/h.  She had moderate sleep apnea with supine sleep with an AHI of 16.9/h.  At her last office visit in November I discussed with her the importance of trying CPAP therapy.  Since initiating therapy she has noticed a marked benefit in her sense of noticing more energy and feeling better.  She is sleeping well.  Her sleep is restorative.  She is still in need for Lunesta 2 help with sleep initiation and maintenance.  Reviewed her download in detail.  She is meeting compliance standards.  Since her 95th percentile pressure is 10.2, I am adjusting her auto parameters such that I will increase her minimum pressure from 6 up to 8 and she will have a range of 8-14 for auto therapy.  I discussed that since she has a full facemask on days where she does note significant nasal congestion where she does breathe from her mouth that she should use the full facemask those nights.  Blood pressure today is stable on current therapy.  He continues to be on simvastatin for hyperlipidemia.  Being followed by [name].  I would suggest more aggressive lipid management since when last checked by [name] her LDL was elevated.  Continues to be on levothyroxine 50 mcg for hypothyroidism.  I will see her in 1 year for reevaluation or sooner if problems arise.   Medication Adjustments/Labs and  Tests Ordered: Current medicines are reviewed at length with the patient today.  Concerns regarding medicines are outlined above.  Medication changes, Labs and Tests ordered today are listed in the Patient Instructions below. Patient Instructions  Medication Instructions:  Your physician recommends that you continue on your current medications as directed. Please refer to the Current Medication list given to you today.  If you need a refill on your cardiac medications before your next appointment, please call your pharmacy.    Follow-Up: At [facility], you and your health needs are our priority.  As part of our continuing mission to provide you with exceptional heart care, we have created designated Provider Care Teams.  These Care Teams include your primary Cardiologist (physician) and Advanced Practice Providers (APPs -  Physician Assistants and Nurse Practitioners) who all work together to provide you with the care you need, when you need it. You will need a follow up appointment in 1 years.  Please call our office 2 months in advance to schedule this appointment.  You may see Dr. Claiborne Billings or one of the following Advanced Practice Providers on your designated Care Team: [place], [place] . Fabian Sharp, PA-C  Any Other Special Instructions Will Be Listed Below (If Applicable).       Signed, Shelva Majestic, MD  08/27/2018 12:25 PM    [facility] [address] [place], [place], [place]  [address] Phone: [phone]

## 2018-08-25 NOTE — Patient Instructions (Signed)
Medication Instructions:  Your physician recommends that you continue on your current medications as directed. Please refer to the Current Medication list given to you today.  If you need a refill on your cardiac medications before your next appointment, please call your pharmacy.    Follow-Up: At [facility], you and your health needs are our priority.  As part of our continuing mission to provide you with exceptional heart care, we have created designated Provider Care Teams.  These Care Teams include your primary Cardiologist (physician) and Advanced Practice Providers (APPs -  Physician Assistants and Nurse Practitioners) who all work together to provide you with the care you need, when you need it. You will need a follow up appointment in 1 years.  Please call our office 2 months in advance to schedule this appointment.  You may see Dr. Claiborne Billings or one of the following Advanced Practice Providers on your designated Care Team: [place], [place] . Fabian Sharp, PA-C  Any Other Special Instructions Will Be Listed Below (If Applicable).

## 2018-08-27 ENCOUNTER — Encounter: Payer: Self-pay | Admitting: Cardiovascular Disease

## 2018-09-09 ENCOUNTER — Other Ambulatory Visit: Payer: Self-pay | Admitting: Family Medicine

## 2018-09-09 DIAGNOSIS — F988 Other specified behavioral and emotional disorders with onset usually occurring in childhood and adolescence: Secondary | ICD-10-CM

## 2018-09-09 MED ORDER — AMPHETAMINE-DEXTROAMPHET ER 30 MG PO CP24: ORAL_CAPSULE | ORAL | 0 refills | Status: DC

## 2018-09-09 NOTE — Addendum Note (Signed)
Addended by: Denita Lung on: 09/09/2018 01:35 PM   Modules accepted: Orders

## 2018-09-09 NOTE — Telephone Encounter (Signed)
As cpe next week with [name]. Is this okay to refill?

## 2018-09-11 ENCOUNTER — Other Ambulatory Visit: Payer: Self-pay | Admitting: Family Medicine

## 2018-09-16 ENCOUNTER — Other Ambulatory Visit: Payer: Medicare Other

## 2018-09-16 ENCOUNTER — Other Ambulatory Visit: Payer: Self-pay

## 2018-09-16 DIAGNOSIS — R5383 Other fatigue: Secondary | ICD-10-CM

## 2018-09-16 DIAGNOSIS — Z5181 Encounter for therapeutic drug level monitoring: Secondary | ICD-10-CM

## 2018-09-16 DIAGNOSIS — E7800 Pure hypercholesterolemia, unspecified: Secondary | ICD-10-CM

## 2018-09-17 LAB — LIPID PANEL
CHOL/HDL RATIO: 2.9 ratio (ref 0.0–4.4)
Cholesterol, Total: 227 mg/dL — ABNORMAL HIGH (ref 100–199)
HDL: 79 mg/dL (ref >39)
LDL Calculated: 127 mg/dL — ABNORMAL HIGH (ref 0–99)
Triglycerides: 107 mg/dL (ref 0–149)
VLDL Cholesterol Cal: 21 mg/dL (ref 5–40)

## 2018-09-17 LAB — CBC WITH DIFFERENTIAL/PLATELET
Basophils Absolute: 0.1 10*3/uL (ref 0.0–0.2)
Basos: 2 %
EOS (ABSOLUTE): 0.2 10*3/uL (ref 0.0–0.4)
Eos: 3 %
Hematocrit: 36 % (ref 34.0–46.6)
Hemoglobin: 12.6 g/dL (ref 11.1–15.9)
Immature Grans (Abs): 0 10*3/uL (ref 0.0–0.1)
Immature Granulocytes: 1 %
Lymphocytes Absolute: 1.9 10*3/uL (ref 0.7–3.1)
Lymphs: 33 %
MCH: 29.6 pg (ref 26.6–33.0)
MCHC: 35 g/dL (ref 31.5–35.7)
MCV: 85 fL (ref 79–97)
Monocytes Absolute: 0.5 10*3/uL (ref 0.1–0.9)
Monocytes: 9 %
Neutrophils Absolute: 3.1 10*3/uL (ref 1.4–7.0)
Neutrophils: 52 %
Platelets: 230 10*3/uL (ref 150–450)
RBC: 4.25 x10E6/uL (ref 3.77–5.28)
RDW: 13.2 % (ref 11.7–15.4)
WBC: 5.8 10*3/uL (ref 3.4–10.8)

## 2018-09-17 LAB — COMPREHENSIVE METABOLIC PANEL
ALT: 13 IU/L (ref 0–32)
AST: 15 IU/L (ref 0–40)
Albumin/Globulin Ratio: 2 (ref 1.2–2.2)
Albumin: 4.3 g/dL (ref 3.8–4.8)
Alkaline Phosphatase: 68 IU/L (ref 39–117)
BUN/Creatinine Ratio: 19 (ref 12–28)
BUN: 12 mg/dL (ref 8–27)
Bilirubin Total: 0.4 mg/dL (ref 0.0–1.2)
CO2: 26 mmol/L (ref 20–29)
Calcium: 9.5 mg/dL (ref 8.7–10.3)
Chloride: 101 mmol/L (ref 96–106)
Creatinine, Ser: 0.62 mg/dL (ref 0.57–1.00)
GFR calc Af Amer: 107 mL/min/{1.73_m2} (ref >59)
GFR calc non Af Amer: 93 mL/min/{1.73_m2} (ref >59)
Globulin, Total: 2.2 g/dL (ref 1.5–4.5)
Glucose: 88 mg/dL (ref 65–99)
Potassium: 4.7 mmol/L (ref 3.5–5.2)
Sodium: 141 mmol/L (ref 134–144)
Total Protein: 6.5 g/dL (ref 6.0–8.5)

## 2018-09-17 NOTE — Progress Notes (Signed)
Chief Complaint  Patient presents with  . Annual Exam    nonfasting AWV/CPE, labs already done. No pap sees Dr Dellis Filbert. Also has annual eye appt. Has a lot of nasal congestion and this is an issue with her CPAP.     Nicole Allen is a 68 y.o. female who presents for annual physical exam, medicare wellness visit and follow-up on chronic medical conditions.   Hypothyroidism: She is under the care of Dr. Chalmers Cater. No notes have been received since 11/2017.  She thinks she went back once 3 months later, told TSH was fine, continue to double up her dose on Sundays.  She doesn't have f/u scheduled with her, unsure if she is supposed to go back or not. She prefers to just follow here. No changes to hair/skin/bowels. Energy is improved since on CPAP for OSA  ADD:She continues to take21m of Adderall. She takes it most days. Deniesside effects. Last filled #30 in 09/09/2018.  Hyperlipidemia: She was tried off simvastatin in the past(she was only on very low dose, tried without--still high, so then we tried treating mild hypothyroidism, but lipids didn't change). Went back on 169mof simvastatin (given family h/o CAD). Tolerating it fine without any side effects. She saw Dr. PaDorris Carnesn [id number], who sent her for Ca score CT, which showed Ca score of 0, no evidence of calcified plaque. She continues to follow a low cholesterol diet.  OSA:  Under the care of Dr. KelClaiborne Billingsast seen in February.  She is compliant with CPAP and energy is improved. "It is amazing".  She feels refreshed in the morning. She continues to have insomnia, needs Lunesta every night, and is effective 90% of the time.  Allergies:  Using flonase and zyrtec every night, along with nasal saline 4x/day. She is having some trouble with the CPAP due to nasal congestion and is asking for recommendations.  GERD: She continues to take omeprazole 57m10mis was working very well for her. Only has some heartburn once  every 3 months.  Denies dysphagia. She tried cutting back to OTC doses and reflux worsened.  Osteopenia: She had f/u DEXA in January, 2018. Her prior GYN wanted her to start on alendronate., She doesn't take calcium (worries about her heart), doesn't drink milk or get much dairy in her diet.  She saw Dr. LavoDellis FilbertJanuary, and was referred to have D[facility] and [facility].  Immunization History  Administered Date(s) Administered  . Hep A / Hep B 05/08/2018  . Hepatitis A 06/12/2018  . Hepatitis B 06/12/2018  . Influenza Split 04/23/2011  . Influenza, High Dose Seasonal PF 07/11/2016, 04/10/2017, 03/27/2018  . Influenza, Seasonal, Injecte, Preservative Fre 06/09/2012  . Influenza,inj,Quad PF,6+ Mos 04/22/2014, 04/14/2015  . Pneumococcal Conjugate-13 07/11/2016  . Pneumococcal Polysaccharide-23 09/16/2017  . Tdap 12/26/2005  . Typhoid Inactivated 05/08/2018  . Zoster 05/14/2011  . Zoster Recombinat (Shingrix) 05/08/2018, 07/10/2018   She [place] through health dept [place] She states she had tetanus given for sure--not documented.  Last Pap smear: 07/2018, normal Last mammogram: 07/2016, scheduled for April Last colonoscopy: 12/2011, small polyp. She called and was told that her next colonoscopy was due 2023, not 5 years (that criteria changed) Last DEXA: DEXA 07/2016 T-2.1 L fem neck; repeat DEXA scheduled for April Ophtho: yearly  Dentist: every 6 months  Exercise: was going to the gym regularly after sleep improved, now can't due  to closure from COVID-19.  Routine had been line-dancing classes 3x/week, and plans to start pickleball when able.   Vitamin D was 36 in 09/2016  Other doctors caring for patient include: GYN: Dr. Dellis Filbert Ophtho: Dr. Herbert Deaner (and Dr. Marica Otter as Optometrist) GI: Dr. Collene Mares Dermatologist: Dr. Delman Cheadle Sleep doctor: Dr. Claiborne Billings Ortho: Dr. Noemi Chapel Cardiology: previously  saw Dr. Harrington Challenger, more recently Dr. Claiborne Billings (for sleep eval) Audiology: [facility]  Depression screen: negative Fall screen: negative Functional Status screen signficant only for known chronic hearing loss (has hearing aids), known ADHD.  Mini-cog screening normal score of 5  End of Life Discussion: Patient hasa living will and medical power of attorney. She was asked to get copies to [place].  Past Medical History:  Diagnosis Date  . ADD (attention deficit disorder)   . Allergy    SEASONAL  . BCC (basal cell carcinoma of skin)    chest; Dr. Tonia Brooms  . BCC (basal cell carcinoma)    Tonia Brooms  . Cervical spondylosis   . Hearing loss    high frequency (Dr. Prescott Parma); hearing aids recommended; bilateral  . History of kidney stones    x3  . Hypothyroidism 2015  . Insomnia    worse with menopause  . Kidney stone    x3, Dr. Reece Agar  . Osteopenia   . Postmenopausal    on HRT--sees GYN  . Pure hypercholesterolemia     Past Surgical History:  Procedure Laterality Date  . CATARACT EXTRACTION Bilateral L 10/18/15 R 12/13/15   Dr. Herbert Deaner  . CESAREAN SECTION  [id number]  . COLONOSCOPY  2003, 2013   Dr. Collene Mares  . HYMENECTOMY  1993  . KNEE SURGERY Right 09/29/2014   Dr. Jim Desanctis replacement/reconstruction  . TONSILLECTOMY AND ADENOIDECTOMY    . TUBAL LIGATION  1991    Social History   Socioeconomic History  . Marital status: Married    Spouse name: Not on file  . Number of children: 2  . Years of education: Not on file  . Highest education level: Not on file  Occupational History  . Occupation: retired Teaching laboratory technician  Social Needs  . Financial resource strain: Not on file  . Food insecurity:    Worry: Not on file    Inability: Not on file  . Transportation needs:    Medical: Not on file    Non-medical: Not on file  Tobacco Use  . Smoking status: Never Smoker  . Smokeless tobacco: Never Used  Substance and Sexual Activity  . Alcohol use: Yes    Alcohol/week: 0.0  standard drinks    Comment: 5-7/week (2 drinks on the weekends, occasional glass during the week once or twice).  . Drug use: No  . Sexual activity: Yes    Partners: Male    Comment: 1st intercourse- 51, partners- 33, married- 64 yrs   Lifestyle  . Physical activity:    Days per week: Not on file    Minutes per session: Not on file  . Stress: Not on file  Relationships  . Social connections:    Talks on phone: Not on file    Gets together: Not on file    Attends religious service: Not on file    Active member of club or organization: Not on file    Attends meetings of clubs or organizations: Not on file    Relationship status: Not on file  . Intimate partner violence:    Fear of current or ex partner: Not on file  Emotionally abused: Not on file    Physically abused: Not on file    Forced sexual activity: Not on file  Other Topics Concern  . Not on file  Social History Narrative   Lives with husband.  Son in [place], [place] in [place], Daughter is in [place]. Grandson (born 04/2015, in [place]).   Son with alcohol issues (and arrested for domestic violence summer/fall 2017)    Family History  Problem Relation Age of Onset  . Asthma Mother   . Arthritis Mother   . Hypertension Mother   . Hyperlipidemia Mother   . Osteoporosis Mother   . Hypertension Father   . Heart disease Father        84's  . Hyperlipidemia Father   . ADD / ADHD Daughter   . ADD / ADHD Son   . Sleep apnea Sister   . Alzheimer's disease Maternal Grandmother   . Cancer Maternal Grandfather        lung  . Cancer Paternal Grandfather        lung    Outpatient Encounter Medications as of 09/18/2018  Medication Sig Note  . amphetamine-dextroamphetamine (ADDERALL XR) 30 MG 24 hr capsule TAKE (1) CAPSULE DAILY IN THE MORNING.   . cetirizine (ZYRTEC) 10 MG tablet Take 10 mg by mouth daily.   . Coenzyme Q10 (COQ10 PO) Take 1 tablet by mouth daily.   Marland Kitchen conjugated estrogens (PREMARIN) vaginal cream Place 2.72  Applicatorfuls vaginally 2 (two) times a week. Thin application on vulva twice weekly.   . Eszopiclone 3 MG TABS Take immediately before bedtime   . fluticasone (FLONASE) 50 MCG/ACT nasal spray USE 2 SPRAYS IN EACH NOSTRIL ONCE A DAY.   . mupirocin ointment (BACTROBAN) 2 % Place 1 application into the nose as directed.   Marland Kitchen omeprazole (PRILOSEC) 40 MG capsule TAKE 1 CAPSULE EVERY DAY.   . simvastatin (ZOCOR) 10 MG tablet Take 1 tablet (10 mg total) by mouth at bedtime.   Marland Kitchen SYNTHROID 50 MCG tablet TAKE 1 TABLET ONCE DAILY BEFORE BREAKFAST. 03/27/2018: On Sunday she takes 145mg.  . TURMERIC PO Take by mouth daily.   . [DISCONTINUED] simvastatin (ZOCOR) 10 MG tablet Take 1 tablet (10 mg total) by mouth at bedtime.    No facility-administered encounter medications on file as of 09/18/2018.     Allergies  Allergen Reactions  . Morphine And Related Other (See Comments)    Family history.  . Codeine Itching and Rash     ROS: The patient denies fever, weight changes, headaches, vision changes, ear pain, sore throat, breast concerns, chest pain, palpitations, dizziness, syncope, dyspnea on exertion, cough, swelling, nausea, vomiting, diarrhea, constipation, abdominal pain, melena, hematochezia, hematuria, incontinence, dysuria, vaginal bleeding, discharge, odor or itch, genital lesions, joint pains (other than her knee), numbness, tingling, weakness, tremor, suspicious skin lesions, abnormal bleeding/bruising, or enlarged lymph nodes.  Denies depression, anxiety (moods significantly better since sleep has been resored). Insomnia is controlled with current regimen (90% of the time) Hearing loss--got hearing aids Bilateral knee pain, gets injections in the knees prior to vacations (strenuous hiking/climbing); braces help for local hikes.  Reflux is well controlled She has nasal congestion, which affects her ability to use her CPAP (nasal). Energy and sleep is better per HPI.   PHYSICAL EXAM:  BP  102/60   Pulse 80   Ht 4' 10.75" (1.492 m)   Wt 110 lb 9.6 oz (50.2 kg)   BMI 22.53 kg/m  Wt Readings from Last 3  Encounters:  09/18/18 110 lb 9.6 oz (50.2 kg)  08/25/18 110 lb 6.4 oz (50.1 kg)  07/08/18 106 lb (48.1 kg)    General Appearance:  Alert, cooperative, pleasant patient in no distress; appears stated age   Head:  Normocephalic, without obvious abnormality, atraumatic   Eyes:  PERRL, conjunctiva/corneas clear, EOM's intact, fundi benign   Ears:  Normal TM's and external ear canals   Nose:  Nares normal, mucosa mildly edematous, no drainage or sinus tenderness   Throat:  Lips, mucosa, and tongue normal; teeth and gums normal.   Neck:  Supple, no lymphadenopathy; thyroid: no enlargement/tenderness/nodules; no carotid  bruit or JVD   Back:  Spine nontender, no curvature, ROM normal, no CVA tenderness   Lungs:  Clear to auscultation bilaterally without wheezes, rales or ronchi; respirations unlabored   Chest Wall:  No tenderness or deformity   Heart:  Regular rate and rhythm, S1 and S2 normal, no murmur, rub or gallop   Breast Exam:  Deferred to GYN   Abdomen:  Soft, non-tender, nondistended, normoactive bowel sounds, no masses,  no hepatosplenomegaly   Genitalia:  Deferred to GYN      Extremities:  No clubbing, cyanosis or edema   Pulses:  2+ and symmetric all extremities   Skin:  Skin color, texture, turgor normal. No rash or suspicious lesions  Lymph nodes:  Cervical, supraclavicular, and axillary nodes normal   Neurologic:  CNII-XII intact, normal strength, sensation and gait; reflexes 2+ and symmetric throughout      Psych:   Normal mood, affect, hygiene, grooming, eye contact and speech.    Chemistry      Component Value Date/Time   NA 141 09/16/2018 0830   K 4.7 09/16/2018 0830   CL 101 09/16/2018 0830   CO2 26 09/16/2018 0830   BUN 12 09/16/2018 0830   CREATININE 0.62 09/16/2018  0830   CREATININE 0.63 07/05/2016 0724      Component Value Date/Time   CALCIUM 9.5 09/16/2018 0830   ALKPHOS 68 09/16/2018 0830   AST 15 09/16/2018 0830   ALT 13 09/16/2018 0830   BILITOT 0.4 09/16/2018 0830     Fasting glu 88  Lab Results  Component Value Date   CHOL 227 (H) 09/16/2018   HDL 79 09/16/2018   LDLCALC 127 (H) 09/16/2018   TRIG 107 09/16/2018   CHOLHDL 2.9 09/16/2018   Lab Results  Component Value Date   WBC 5.8 09/16/2018   HGB 12.6 09/16/2018   HCT 36.0 09/16/2018   MCV 85 09/16/2018   PLT 230 09/16/2018    ASSESSMENT/PLAN:  Annual physical exam - Plan: POCT Urinalysis DIP (Proadvantage Device)  Medicare annual wellness visit, initial  Attention deficit disorder, unspecified hyperactivity presence - controlled on current regimen  Hypothyroidism, unspecified type - pt requesting we add on TSH to recent labs, to cont to follow here for thyroid. euthyroid by history  OSA (obstructive sleep apnea) - doing much better, continue CPAP. Can use Afrin prior to putting on mask  Insomnia, unspecified type - controlled, improved  Gastroesophageal reflux disease, esophagitis presence not specified - controlled; requires daily rx PPI. Risks reviewed, encouraged MVI. Reassured ok to have Ca in MVI, Ca score 0.   Osteopenia of necks of both femurs - reviewed calcium recommendations, and ways to get more in her diet. DEXA scheduled for April  Pure hypercholesterolemia - controlled; continue statin - Plan: simvastatin (ZOCOR) 10 MG tablet  Will request records and labs from Dr. Chalmers Cater Add  on TSH per pt request  Pt will call travel clinic at health dept to verify that she got tetanus, and will let us know the type and date.   Discussed monthly self breast exams and yearly mammograms (past due, scheduled for April); at least 30 minutes of aerobic activity at least 5 days/week, weight-bearing exercise 2x/wk; proper sunscreen use reviewed; healthy diet, including  goals of calcium and vitamin D intake and alcohol recommendations (less than or equal to 1 drink/day) reviewed; regular seatbelt use; changing batteries in smoke detectors. Immunization recommendations discussed--will check on tetanus per above.Continue yearly high dose flu shots.  Colonoscopy recommendations reviewed--UTD (She checked with Dr. Lorie Apley office and was told 10 year follow up is now the recommendation, not 5 years)  F/u 1 year, with labs prior CBC, c-met, lipids, TSH    Medicare Attestation I have personally reviewed: The patient's medical and social history Their use of alcohol, tobacco or illicit drugs Their current medications and supplements The patient's functional ability including ADLs,fall risks, home safety risks, cognitive, and hearing and visual impairment Diet and physical activities Evidence for depression or mood disorders  The patient's weight, height, BMI, and visual acuity have been recorded in the chart.  I have made referrals, counseling, and provided education to the patient based on review of the above and I have provided the patient with a written personalized care plan for preventive services.

## 2018-09-17 NOTE — Patient Instructions (Addendum)
  HEALTH MAINTENANCE RECOMMENDATIONS:  It is recommended that you get at least 30 minutes of aerobic exercise at least 5 days/week (for weight loss, you may need as much as 60-90 minutes). This can be any activity that gets your heart rate up. This can be divided in 10-15 minute intervals if needed, but try and build up your endurance at least once a week.  Weight bearing exercise is also recommended twice weekly.  Eat a healthy diet with lots of vegetables, fruits and fiber.  "Colorful" foods have a lot of vitamins (ie green vegetables, tomatoes, red peppers, etc).  Limit sweet tea, regular sodas and alcoholic beverages, all of which has a lot of calories and sugar.  Up to 1 alcoholic drink daily may be beneficial for women (unless trying to lose weight, watch sugars).  Drink a lot of water.  Calcium recommendations are 1200-1500 mg daily (1500 mg for postmenopausal women or women without ovaries), and vitamin D 1000 IU daily.  This should be obtained from diet and/or supplements (vitamins), and calcium should not be taken all at once, but in divided doses.  Monthly self breast exams and yearly mammograms for women over the age of 59 is recommended.  Sunscreen of at least SPF 30 should be used on all sun-exposed parts of the skin when outside between the hours of 10 am and 4 pm (not just when at beach or pool, but even with exercise, golf, tennis, and yard work!)  Use a sunscreen that says "broad spectrum" so it covers both UVA and UVB rays, and make sure to reapply every 1-2 hours.  Remember to change the batteries in your smoke detectors when changing your clock times in the spring and fall.  Use your seat belt every time you are in a car, and please drive safely and not be distracted with cell phones and texting while driving.   Ms. Quizon , Thank you for taking time to come for your Medicare Wellness Visit. I appreciate your ongoing commitment to your health goals. Please review the following  plan we discussed and let me know if I can assist you in the future.    This is a list of the screening recommended for you and due dates:  Health Maintenance  Topic Date Due  . Tetanus Vaccine  12/27/2015  . Mammogram  08/01/2018  . Colon Cancer Screening  12/18/2021  . Flu Shot  Completed  . DEXA scan (bone density measurement)  Completed  .  Hepatitis C: One time screening is recommended by Center for Disease Control  (CDC) for  adults born from [birth year] through 1965.   Completed  . Pneumonia vaccines  Completed   Mammograms are recommended yearly (date above may not be correct, is 2 years from the last one we have on file)--get as scheduled in April  Bone density is due--get as scheduled in April.  Contact the travel clinic at the health department to find out the type and date of your tetanus shot (Td vs TdaP) and let us know so that we can enter it in your record.  At your convenience, please get [place] copies of your living will and healthcare power of attorney so we can scan them into your chart.  You may use Afrin at bedtime, prior to putting on your CPAP--that should make it much more comfortable.

## 2018-09-18 ENCOUNTER — Encounter: Payer: Self-pay | Admitting: Family Medicine

## 2018-09-18 ENCOUNTER — Ambulatory Visit: Payer: Medicare Other | Admitting: Family Medicine

## 2018-09-18 ENCOUNTER — Other Ambulatory Visit: Payer: Self-pay

## 2018-09-18 VITALS — BP 102/60 | HR 80 | Ht 58.75 in | Wt 110.6 lb

## 2018-09-18 DIAGNOSIS — Z0000 Encounter for general adult medical examination without abnormal findings: Secondary | ICD-10-CM | POA: Diagnosis not present

## 2018-09-18 DIAGNOSIS — G4700 Insomnia, unspecified: Secondary | ICD-10-CM

## 2018-09-18 DIAGNOSIS — E7800 Pure hypercholesterolemia, unspecified: Secondary | ICD-10-CM

## 2018-09-18 DIAGNOSIS — F988 Other specified behavioral and emotional disorders with onset usually occurring in childhood and adolescence: Secondary | ICD-10-CM | POA: Diagnosis not present

## 2018-09-18 DIAGNOSIS — E039 Hypothyroidism, unspecified: Secondary | ICD-10-CM

## 2018-09-18 DIAGNOSIS — Z5181 Encounter for therapeutic drug level monitoring: Secondary | ICD-10-CM

## 2018-09-18 DIAGNOSIS — G4733 Obstructive sleep apnea (adult) (pediatric): Secondary | ICD-10-CM

## 2018-09-18 DIAGNOSIS — M85851 Other specified disorders of bone density and structure, right thigh: Secondary | ICD-10-CM

## 2018-09-18 DIAGNOSIS — M85852 Other specified disorders of bone density and structure, left thigh: Secondary | ICD-10-CM

## 2018-09-18 DIAGNOSIS — K219 Gastro-esophageal reflux disease without esophagitis: Secondary | ICD-10-CM

## 2018-09-18 LAB — POCT URINALYSIS DIP (PROADVANTAGE DEVICE)
Bilirubin, UA: NEGATIVE
Glucose, UA: NEGATIVE mg/dL
Ketones, POC UA: NEGATIVE mg/dL
Leukocytes, UA: NEGATIVE
Nitrite, UA: NEGATIVE
Protein Ur, POC: NEGATIVE mg/dL
Specific Gravity, Urine: 1.02
Urobilinogen, Ur: NEGATIVE
pH, UA: 5.5 (ref 5.0–8.0)

## 2018-09-18 MED ORDER — SIMVASTATIN 10 MG PO TABS: 10.0000 mg | ORAL_TABLET | Freq: Every day | ORAL | 3 refills | Status: DC

## 2018-09-19 LAB — TSH: TSH: 2.35 u[IU]/mL (ref 0.450–4.500)

## 2018-09-19 LAB — SPECIMEN STATUS REPORT

## 2018-09-25 ENCOUNTER — Telehealth: Payer: Self-pay | Admitting: Family Medicine

## 2018-09-25 NOTE — Telephone Encounter (Signed)
Records received from [facility]. Forwarding for review.

## 2018-10-03 ENCOUNTER — Other Ambulatory Visit: Payer: Self-pay | Admitting: Family Medicine

## 2018-10-03 ENCOUNTER — Other Ambulatory Visit: Payer: Self-pay | Admitting: Cardiovascular Disease

## 2018-10-03 NOTE — Telephone Encounter (Signed)
Is this ok to refill?  

## 2018-10-06 ENCOUNTER — Encounter: Payer: Self-pay | Admitting: Family Medicine

## 2018-10-06 ENCOUNTER — Other Ambulatory Visit: Payer: Self-pay | Admitting: Cardiovascular Disease

## 2018-10-07 ENCOUNTER — Other Ambulatory Visit: Payer: Self-pay | Admitting: Cardiovascular Disease

## 2018-10-07 NOTE — Telephone Encounter (Signed)
Patient called about this script pharmacy has not received it.  Patient is out of medication.

## 2018-10-07 NOTE — Telephone Encounter (Signed)
Pt calling requesting a refill on eszopiclone (Lunesta) 3 mg tablet be sent to [facility]. Please address

## 2018-10-08 NOTE — Telephone Encounter (Signed)
Ok to renew?  

## 2018-10-09 NOTE — Telephone Encounter (Signed)
Refilled Lunesta 3 mg # 30 with 3 RF per V/O from Dr Claiborne Billings.

## 2018-11-18 ENCOUNTER — Other Ambulatory Visit: Payer: Self-pay | Admitting: Family Medicine

## 2018-11-18 DIAGNOSIS — K219 Gastro-esophageal reflux disease without esophagitis: Secondary | ICD-10-CM

## 2018-12-13 ENCOUNTER — Other Ambulatory Visit: Payer: Self-pay | Admitting: Family Medicine

## 2018-12-13 DIAGNOSIS — F988 Other specified behavioral and emotional disorders with onset usually occurring in childhood and adolescence: Secondary | ICD-10-CM

## 2018-12-15 NOTE — Telephone Encounter (Signed)
Is this okay to refill? 

## 2019-02-18 ENCOUNTER — Other Ambulatory Visit: Payer: Self-pay | Admitting: Family Medicine

## 2019-02-18 DIAGNOSIS — K219 Gastro-esophageal reflux disease without esophagitis: Secondary | ICD-10-CM

## 2019-03-02 ENCOUNTER — Other Ambulatory Visit: Payer: Medicare Other

## 2019-03-02 ENCOUNTER — Telehealth: Payer: Self-pay | Admitting: *Deleted

## 2019-03-02 NOTE — Telephone Encounter (Signed)
Yes agree with 1/2 applicator twice a week.

## 2019-03-02 NOTE — Telephone Encounter (Signed)
Pharmacist Angie called stating patient insurance is not filling her premarin vaginal cream due to early refills. The pharmacist said maybe if the directions are switched to 1/2 applicator twice weekly then insurance may fill. Okay to call in these directions? Please advise

## 2019-03-03 MED ORDER — PREMARIN 0.625 MG/GM VA CREA: TOPICAL_CREAM | VAGINAL | 1 refills | Status: DC

## 2019-03-03 NOTE — Telephone Encounter (Signed)
Called and left on voicemail and Rx sent with new directions.

## 2019-03-11 ENCOUNTER — Other Ambulatory Visit: Payer: Medicare Other

## 2019-03-16 ENCOUNTER — Other Ambulatory Visit: Payer: Self-pay | Admitting: Family Medicine

## 2019-03-16 DIAGNOSIS — F988 Other specified behavioral and emotional disorders with onset usually occurring in childhood and adolescence: Secondary | ICD-10-CM

## 2019-03-16 NOTE — Telephone Encounter (Signed)
Is this okay to refill? 

## 2019-04-13 ENCOUNTER — Other Ambulatory Visit (INDEPENDENT_AMBULATORY_CARE_PROVIDER_SITE_OTHER): Payer: Medicare Other

## 2019-04-13 ENCOUNTER — Other Ambulatory Visit: Payer: Self-pay

## 2019-04-13 DIAGNOSIS — Z23 Encounter for immunization: Secondary | ICD-10-CM | POA: Diagnosis not present

## 2019-05-02 ENCOUNTER — Other Ambulatory Visit: Payer: Self-pay | Admitting: Cardiovascular Disease

## 2019-05-05 ENCOUNTER — Other Ambulatory Visit: Payer: Self-pay | Admitting: Cardiovascular Disease

## 2019-05-05 NOTE — Telephone Encounter (Signed)
Called to [facility], Dr Claiborne Billings Rx'd in April

## 2019-05-05 NOTE — Telephone Encounter (Signed)
New Message   *STAT* If patient is at the pharmacy, call can be transferred to refill team.   1. Which medications need to be refilled? (please list name of each medication and dose if known) Lunesta   2. Which pharmacy/location (including street and city if local pharmacy) is medication to be sent to? [place], [place]  3. Do they need a 30 day or 90 day supply? 90 day

## 2019-05-05 NOTE — Telephone Encounter (Signed)
Spoke with patient regarding Eszopiclone (generic Lunesta). Dr Claiborne Billings Rx'd in April. Refilled as requested #30 with 1 refill, called to [facility]

## 2019-05-05 NOTE — Telephone Encounter (Signed)
Pt calling stating that needs a refill on eszopiclone 3 mg tablet, pt needs it today because it traveling our of town today. Please address

## 2019-06-19 ENCOUNTER — Encounter: Payer: Self-pay | Admitting: Family Medicine

## 2019-07-01 ENCOUNTER — Other Ambulatory Visit: Payer: Self-pay | Admitting: Cardiovascular Disease

## 2019-07-02 ENCOUNTER — Other Ambulatory Visit: Payer: Self-pay | Admitting: Cardiovascular Disease

## 2019-07-02 ENCOUNTER — Other Ambulatory Visit: Payer: Self-pay

## 2019-07-02 MED ORDER — ESZOPICLONE 3 MG PO TABS: ORAL_TABLET | ORAL | 1 refills | Status: DC

## 2019-07-02 NOTE — Telephone Encounter (Signed)
*  STAT* If patient is at the pharmacy, call can be transferred to refill team.   1. Which medications need to be refilled? (please list name of each medication and dose if known) Eszopiclone 3 MG TABS [id number]  2. Which pharmacy/location (including street and city if local pharmacy) is medication to be sent to? [place], [place]   3. Do they need a 30 day or 90 day supply? 30 days

## 2019-07-02 NOTE — Telephone Encounter (Signed)
I have routed request to Dr. Gwenlyn Found, Dr. Claiborne Billings, and Allie Bossier., RN concerning refill for this med.

## 2019-07-02 NOTE — Telephone Encounter (Signed)
Pt of Dr Gwenlyn Found... Called requesting a refill on Lunesta 3mg . I tried to send it in but in will only print and he will have to sign the Rx.   If you can't get it sent in today please call the pt. She is concerned about being out of meds.  Thanks.

## 2019-07-03 ENCOUNTER — Telehealth: Payer: Self-pay | Admitting: Physician Assistant

## 2019-07-03 DIAGNOSIS — H16309 Unspecified interstitial keratitis, unspecified eye: Secondary | ICD-10-CM

## 2019-07-03 HISTORY — DX: Unspecified interstitial keratitis, unspecified eye: H16.309

## 2019-07-03 NOTE — Telephone Encounter (Signed)
Patient with sleep apnea.   She has been prescribed Lunesta by Dr. Claiborne Billings.  Refill was sent to her pharmacy but not received. I will send again to her pharmacy today. Richardson Dopp, PA-C    07/03/2019 12:22 PM

## 2019-07-06 ENCOUNTER — Other Ambulatory Visit: Payer: Self-pay | Admitting: Family Medicine

## 2019-07-06 DIAGNOSIS — F988 Other specified behavioral and emotional disorders with onset usually occurring in childhood and adolescence: Secondary | ICD-10-CM

## 2019-07-07 NOTE — Telephone Encounter (Signed)
Is this okay to refill? 

## 2019-07-14 ENCOUNTER — Ambulatory Visit: Payer: Medicare PPO | Admitting: Obstetrics & Gynecology

## 2019-07-14 ENCOUNTER — Encounter: Payer: Self-pay | Admitting: Obstetrics & Gynecology

## 2019-07-14 ENCOUNTER — Other Ambulatory Visit: Payer: Self-pay

## 2019-07-14 VITALS — BP 110/70 | Ht 58.5 in | Wt 111.0 lb

## 2019-07-14 DIAGNOSIS — M8589 Other specified disorders of bone density and structure, multiple sites: Secondary | ICD-10-CM

## 2019-07-14 DIAGNOSIS — Z780 Asymptomatic menopausal state: Secondary | ICD-10-CM | POA: Diagnosis not present

## 2019-07-14 DIAGNOSIS — N952 Postmenopausal atrophic vaginitis: Secondary | ICD-10-CM

## 2019-07-14 DIAGNOSIS — Z01419 Encounter for gynecological examination (general) (routine) without abnormal findings: Secondary | ICD-10-CM

## 2019-07-14 MED ORDER — PREMARIN 0.625 MG/GM VA CREA: TOPICAL_CREAM | VAGINAL | 4 refills | Status: DC

## 2019-07-14 NOTE — Progress Notes (Signed)
Nicole Allen 1951/02/22 [id number]   History:    69 y.o.  G2P2L2 Married  RP: Established patient presenting for annual gyn exam   HPI: Menopause, well on no hormone replacement therapy.  No postmenopausal bleeding.  No pelvic pain.  Sexually active, improved with Premarin cream.  Breasts normal.  Urine and bowel movements normal.  Body mass index 22.8.  Sleep disorder, much improved with CPAP.  Health labs with family physician.   Past medical history,surgical history, family history and social history were all reviewed and documented in the EPIC chart.  Gynecologic History No LMP recorded. Patient is postmenopausal.  Obstetric History OB History  Gravida Para Term Preterm AB Living  2 2       2   SAB TAB Ectopic Multiple Live Births               # Outcome Date GA Lbr Len/2nd Weight Sex Delivery Anes PTL Lv  2 Para           1 Para              ROS: A ROS was performed and pertinent positives and negatives are included in the history.  GENERAL: No fevers or chills. HEENT: No change in vision, no earache, sore throat or sinus congestion. NECK: No pain or stiffness. CARDIOVASCULAR: No chest pain or pressure. No palpitations. PULMONARY: No shortness of breath, cough or wheeze. GASTROINTESTINAL: No abdominal pain, nausea, vomiting or diarrhea, melena or bright red blood per rectum. GENITOURINARY: No urinary frequency, urgency, hesitancy or dysuria. MUSCULOSKELETAL: No joint or muscle pain, no back pain, no recent trauma. DERMATOLOGIC: No rash, no itching, no lesions. ENDOCRINE: No polyuria, polydipsia, no heat or cold intolerance. No recent change in weight. HEMATOLOGICAL: No anemia or easy bruising or bleeding. NEUROLOGIC: No headache, seizures, numbness, tingling or weakness. PSYCHIATRIC: No depression, no loss of interest in normal activity or change in sleep pattern.     Exam:   BP 110/70   Ht 4' 10.5" (1.486 m)   Wt 111 lb (50.3 kg)   BMI 22.80 kg/m   Body mass  index is 22.8 kg/m.  General appearance : Well developed well nourished female. No acute distress HEENT: Eyes: no retinal hemorrhage or exudates,  Neck supple, trachea midline, no carotid bruits, no thyroidmegaly Lungs: Clear to auscultation, no rhonchi or wheezes, or rib retractions  Heart: Regular rate and rhythm, no murmurs or gallops Breast:Examined in sitting and supine position were symmetrical in appearance, no palpable masses or tenderness,  no skin retraction, no nipple inversion, no nipple discharge, no skin discoloration, no axillary or supraclavicular lymphadenopathy Abdomen: no palpable masses or tenderness, no rebound or guarding Extremities: no edema or skin discoloration or tenderness  Pelvic: Vulva: Normal             Vagina: No gross lesions or discharge  Cervix: No gross lesions or discharge  Uterus AV, normal size, shape and consistency, non-tender and mobile  Adnexa  Without masses or tenderness  Anus: Normal   Assessment/Plan:  69 y.o. female for annual exam   1. Well female exam with routine gynecological exam Normal gynecologic exam in menopause.  Pap test January 2020 was negative, no indication to repeat this year.  Breast exam normal.  Overdue for screening mammogram, patient is scheduled at [facility]  Colonoscopy in 2013, on a 10-year basis.  Health labs with family physician.  Good body mass index at 22.8, but recommend improving fitness.  2. Postmenopausal  Well on no hormone replacement therapy.  No postmenopausal bleeding.  3. Postmenopausal atrophic vaginitis Well on Premarin cream.  Will use a quarter of an applicator twice weekly.  Prescribed to make sure patient has enough, prescription sent to pharmacy.  4. Osteopenia of multiple sites Bone density January 2018 showed osteopenia with a T score at -2.1.  Scheduled to repeat a bone density at [facility].  Continue with vitamin D supplements, calcium intake of 1.2 g daily and regular weightbearing physical  activities.    Other orders - conjugated estrogens (PREMARIN) vaginal cream; Insert 1 applicator twice weekly vaginally.  Princess Bruins MD, 9:28 AM 07/14/2019

## 2019-07-14 NOTE — Patient Instructions (Signed)
1. Well female exam with routine gynecological exam Normal gynecologic exam in menopause.  Pap test January 2020 was negative, no indication to repeat this year.  Breast exam normal.  Overdue for screening mammogram, patient is scheduled at [facility].  Colonoscopy in 2013, on a 10-year basis.  Health labs with family physician.  Good body mass index at 22.8, but recommend improving fitness.  2. Postmenopausal Well on no hormone replacement therapy.  No postmenopausal bleeding.  3. Postmenopausal atrophic vaginitis Well on Premarin cream.  Will use a quarter of an applicator twice weekly.  Prescribed to make sure patient has enough, prescription sent to pharmacy.  4. Osteopenia of multiple sites Bone density January 2018 showed osteopenia with a T score at -2.1.  Scheduled to repeat a bone density at [facility].  Continue with vitamin D supplements, calcium intake of 1.2 g daily and regular weightbearing physical activities.    Other orders - conjugated estrogens (PREMARIN) vaginal cream; Insert 1 applicator twice weekly vaginally.  Beth, it was a pleasure seeing you today!

## 2019-07-30 ENCOUNTER — Other Ambulatory Visit: Payer: Self-pay

## 2019-07-30 ENCOUNTER — Ambulatory Visit: Payer: Medicare Other

## 2019-08-03 ENCOUNTER — Other Ambulatory Visit: Payer: Self-pay | Admitting: Cardiovascular Disease

## 2019-08-03 NOTE — Telephone Encounter (Signed)
*  STAT* If patient is at the pharmacy, call can be transferred to refill team.   1. Which medications need to be refilled? (please list name of each medication and dose if known) Eszopiclone 3 MG TABS  2. Which pharmacy/location (including street and city if local pharmacy) is medication to be sent to? [place], [place]  3. Do they need a 30 day or 90 day supply? 90 day  Patient is out of medication.

## 2019-08-04 ENCOUNTER — Other Ambulatory Visit: Payer: Self-pay | Admitting: Cardiovascular Disease

## 2019-08-04 MED ORDER — ESZOPICLONE 3 MG PO TABS: ORAL_TABLET | ORAL | 1 refills | Status: DC

## 2019-08-04 NOTE — Telephone Encounter (Signed)
Informed pt a refill was sent today 2/2 around 10 am. Pt state pharmacy has yet to receive it. Nurse will re-send prescription.

## 2019-08-04 NOTE — Telephone Encounter (Signed)
Rx has been sent to the pharmacy electronically. ° °

## 2019-08-05 ENCOUNTER — Other Ambulatory Visit: Payer: Self-pay | Admitting: Cardiovascular Disease

## 2019-08-05 NOTE — Telephone Encounter (Signed)
New Message     *STAT* If patient is at the pharmacy, call can be transferred to refill team.   1. Which medications need to be refilled? (please list name of each medication and dose if known) Eszopiclone 3 MG TABS  2. Which pharmacy/location (including street and city if local pharmacy) is medication to be sent to? [place], [place]  3. Do they need a 30 day or 90 day supply? 44    Cassie from [facility] is requesting the pt get more refills then the 30 day. She says they received the request from January but is needing another one sent with more refills

## 2019-08-07 ENCOUNTER — Ambulatory Visit: Payer: Medicare PPO

## 2019-08-14 ENCOUNTER — Other Ambulatory Visit: Payer: Self-pay | Admitting: Family Medicine

## 2019-08-14 DIAGNOSIS — K219 Gastro-esophageal reflux disease without esophagitis: Secondary | ICD-10-CM

## 2019-08-14 NOTE — Telephone Encounter (Signed)
Pt has upcoming appt 

## 2019-08-20 ENCOUNTER — Ambulatory Visit: Payer: Medicare Other

## 2019-08-24 ENCOUNTER — Telehealth: Payer: Self-pay | Admitting: Family Medicine

## 2019-08-24 DIAGNOSIS — E039 Hypothyroidism, unspecified: Secondary | ICD-10-CM

## 2019-08-24 MED ORDER — SYNTHROID 50 MCG PO TABS: ORAL_TABLET | ORAL | 0 refills | Status: DC

## 2019-08-24 NOTE — Telephone Encounter (Signed)
Done

## 2019-08-24 NOTE — Telephone Encounter (Signed)
  Pt needs refill on synthroid, she states pharmacy told her that We denied it twice but she has appointment next month   [facility]  She has appt on March 25 th

## 2019-09-03 ENCOUNTER — Ambulatory Visit: Payer: Medicare PPO | Admitting: Cardiovascular Disease

## 2019-09-17 DIAGNOSIS — M8589 Other specified disorders of bone density and structure, multiple sites: Secondary | ICD-10-CM | POA: Diagnosis not present

## 2019-09-17 DIAGNOSIS — Z1231 Encounter for screening mammogram for malignant neoplasm of breast: Secondary | ICD-10-CM | POA: Diagnosis not present

## 2019-09-17 LAB — HM DEXA SCAN

## 2019-09-17 LAB — HM MAMMOGRAPHY

## 2019-09-18 DIAGNOSIS — J342 Deviated nasal septum: Secondary | ICD-10-CM | POA: Diagnosis not present

## 2019-09-18 DIAGNOSIS — G4733 Obstructive sleep apnea (adult) (pediatric): Secondary | ICD-10-CM | POA: Diagnosis not present

## 2019-09-18 DIAGNOSIS — J343 Hypertrophy of nasal turbinates: Secondary | ICD-10-CM | POA: Diagnosis not present

## 2019-09-18 DIAGNOSIS — J310 Chronic rhinitis: Secondary | ICD-10-CM | POA: Diagnosis not present

## 2019-09-21 ENCOUNTER — Encounter: Payer: Self-pay | Admitting: *Deleted

## 2019-09-22 ENCOUNTER — Other Ambulatory Visit: Payer: Self-pay

## 2019-09-22 ENCOUNTER — Other Ambulatory Visit: Payer: Medicare PPO

## 2019-09-22 DIAGNOSIS — Z5181 Encounter for therapeutic drug level monitoring: Secondary | ICD-10-CM

## 2019-09-22 DIAGNOSIS — E7800 Pure hypercholesterolemia, unspecified: Secondary | ICD-10-CM

## 2019-09-22 DIAGNOSIS — H04123 Dry eye syndrome of bilateral lacrimal glands: Secondary | ICD-10-CM | POA: Diagnosis not present

## 2019-09-22 DIAGNOSIS — Z0000 Encounter for general adult medical examination without abnormal findings: Secondary | ICD-10-CM

## 2019-09-22 DIAGNOSIS — E039 Hypothyroidism, unspecified: Secondary | ICD-10-CM | POA: Diagnosis not present

## 2019-09-22 DIAGNOSIS — H16223 Keratoconjunctivitis sicca, not specified as Sjogren's, bilateral: Secondary | ICD-10-CM | POA: Diagnosis not present

## 2019-09-22 DIAGNOSIS — H3552 Pigmentary retinal dystrophy: Secondary | ICD-10-CM | POA: Diagnosis not present

## 2019-09-22 DIAGNOSIS — H43393 Other vitreous opacities, bilateral: Secondary | ICD-10-CM | POA: Diagnosis not present

## 2019-09-22 DIAGNOSIS — Z961 Presence of intraocular lens: Secondary | ICD-10-CM | POA: Diagnosis not present

## 2019-09-22 DIAGNOSIS — H43813 Vitreous degeneration, bilateral: Secondary | ICD-10-CM | POA: Diagnosis not present

## 2019-09-23 LAB — CBC WITH DIFFERENTIAL/PLATELET
Basophils Absolute: 0.1 10*3/uL (ref 0.0–0.2)
Basos: 2 %
EOS (ABSOLUTE): 0.2 10*3/uL (ref 0.0–0.4)
Eos: 4 %
Hematocrit: 35.5 % (ref 34.0–46.6)
Hemoglobin: 12.5 g/dL (ref 11.1–15.9)
Immature Grans (Abs): 0 10*3/uL (ref 0.0–0.1)
Immature Granulocytes: 0 %
Lymphocytes Absolute: 1.9 10*3/uL (ref 0.7–3.1)
Lymphs: 34 %
MCH: 30.3 pg (ref 26.6–33.0)
MCHC: 35.2 g/dL (ref 31.5–35.7)
MCV: 86 fL (ref 79–97)
Monocytes Absolute: 0.5 10*3/uL (ref 0.1–0.9)
Monocytes: 9 %
Neutrophils Absolute: 2.8 10*3/uL (ref 1.4–7.0)
Neutrophils: 51 %
Platelets: 239 10*3/uL (ref 150–450)
RBC: 4.13 x10E6/uL (ref 3.77–5.28)
RDW: 13.2 % (ref 11.7–15.4)
WBC: 5.5 10*3/uL (ref 3.4–10.8)

## 2019-09-23 LAB — COMPREHENSIVE METABOLIC PANEL
ALT: 11 IU/L (ref 0–32)
AST: 19 IU/L (ref 0–40)
Albumin/Globulin Ratio: 2 (ref 1.2–2.2)
Albumin: 4.4 g/dL (ref 3.8–4.8)
Alkaline Phosphatase: 64 IU/L (ref 39–117)
BUN/Creatinine Ratio: 17 (ref 12–28)
BUN: 11 mg/dL (ref 8–27)
Bilirubin Total: 0.4 mg/dL (ref 0.0–1.2)
CO2: 27 mmol/L (ref 20–29)
Calcium: 9.4 mg/dL (ref 8.7–10.3)
Chloride: 103 mmol/L (ref 96–106)
Creatinine, Ser: 0.63 mg/dL (ref 0.57–1.00)
GFR calc Af Amer: 106 mL/min/{1.73_m2} (ref >59)
GFR calc non Af Amer: 92 mL/min/{1.73_m2} (ref >59)
Globulin, Total: 2.2 g/dL (ref 1.5–4.5)
Glucose: 93 mg/dL (ref 65–99)
Potassium: 4.9 mmol/L (ref 3.5–5.2)
Sodium: 142 mmol/L (ref 134–144)
Total Protein: 6.6 g/dL (ref 6.0–8.5)

## 2019-09-23 LAB — LIPID PANEL
Chol/HDL Ratio: 2.9 ratio (ref 0.0–4.4)
Cholesterol, Total: 202 mg/dL — ABNORMAL HIGH (ref 100–199)
HDL: 70 mg/dL (ref >39)
LDL Chol Calc (NIH): 113 mg/dL — ABNORMAL HIGH (ref 0–99)
Triglycerides: 107 mg/dL (ref 0–149)
VLDL Cholesterol Cal: 19 mg/dL (ref 5–40)

## 2019-09-23 LAB — TSH: TSH: 1.27 u[IU]/mL (ref 0.450–4.500)

## 2019-09-23 NOTE — Progress Notes (Signed)
Chief Complaint  Patient presents with  . Medicare Wellness    nonfasting AWV, labs already done. Has a scar over her lip she would like you to look at and determine if she needs to see derm. Also would like to start anxiety med.     Nicole Allen is a 69 y.o. female who presents for annual physical exam, Medicare wellness visit and follow-up on chronic medical conditions.  She also has concerns about anxiety, see below.  She saw Dr. Wilburn Cornelia (ENT) last week for evaluation of deviated septum, turbinate hypertrophy and chronic nasal airway obstruction. He recommended nasal septoplasty and inferior turbinate reduction. She is scheduled for surgery on 11/11/19.  OSA:  Under the care of Dr. Claiborne Billings, has an appointment scheduled for next month.  She is compliant with CPAP, but can only use it until about 4am, needs to take it off because she can't breathe (nasal congestion/blockage, reason she saw the ENT).  She reports she is having apneic events while wearing it, sometimes up to 18 an hour.  She can tell that it isn't working like it used, having more brain fog and fatigue during the day.  She continues to have insomnia, needs Lunesta every night, and is effective 90% of the time or more.  If she doesn't take Lunesta, she doesn't sleep at tall. She wants [place] to take over the [facility] prescription, as we are more efficient with refills than Dr. Evette Georges office (doesn't believe she needs a refill yet).  Hypothyroidism: She previously saw Dr. Chalmers Cater, now managed here.  She continues on the same dose, continues to double up her dose on Sundays.  No changes to skin/bowels. She has noticed a lot more shedding of her hair.  Cannot tell if hair is thinning (because hair was always kept short). No change in weight.  Decreased energy related to poor sleep and sleep apnea.  ADD:She continues to take30mg  of Adderall. She feels like she has been struggling, meds not working as well.  Trouble concentrating. Her  "tolerance for frustration is the pits".  She is very intolerant of things, can't even sit through a show her husband is watching that she has no interest in.  Noticed since being home more over the last year (pandemic). She is also having increased anxiety (see below). She has not been sleeping well due to poor tolerance of CPAP, as noted above. [place] effects of Adderall. Last filled #90 on 07/07/2019  She complains of anxiety, irritability.  She finds some of her impatience is related to her ADD.  There is FHx of anxiety in her mother, sister, children.  She feels like she is "no longer on top of it".  Gets a "seizing"/clutching feeling in her chest/throat, which can then sometimes trigger a little panic, and sometimes is unable to move, get to a better place, almost like frozen. Playing phone tag with people drives her crazy, impatient/frustrated. Will notice that she breathes faster/panting.  This is happening about every other day, some days it occurs multiple times.  This has gotten worse in the last 3 months.  Other than pandemic, no change in stressors. She was on prozac a few years ago (didn't stay on long, tolerated it in the distant past).  Hyperlipidemia: She was tried off simvastatin in the past(she was only on very low dose, tried without--still high, so then we tried treating mild hypothyroidism, but lipids didn't change). Went back on 10mg  of simvastatin (given family h/o CAD). Tolerating it fine without  any side effects. She saw Dr. Dorris Carnes in [id number] consult, who sent her for Ca score CT, which showed Ca score of 0, no evidence of calcified plaque. She continues to follow a low cholesterol diet. Doesn't eat red meat, has cheese, avoids mayonnaise, eats eggs once a week.  Allergies:  Using flonase and zyrtec every night, along with nasal saline 4x/day. Allergies are controlled, but has nasal airway obstruction as reported above.  GERD: She continues to take  omeprazole 40mg .This has been effective. Only has some heartburn once every 2-3 months.  She had a "bad spell" lasting 4-5 days a month or two ago, relieved by Tums.  Denies dysphagia. She tried cutting back to OTC doses and reflux worsened in the past.  Osteopenia:After her last DEXA in January, 2018, her  priorGYN wantedher to start on alendronate--she recalls now that she wasn't bad enough for insurance to cover it., She doesn't take calcium (worries about her heart), just a MVI daily.  She doesn't drink milk, but she eats cheese.  She only recently started trying yogurt.  She had f/u DEXA done earlier this month.  T-2.1 at R femoral neck, -2.3 at L femoral neck, T-1.9 at spine, and -2.1 L total femur. FRAX score was 18% and 4.1% for hip fracture.  Immunization History  Administered Date(s) Administered  . Fluad Quad(high Dose 65+) 04/13/2019  . Hep A / Hep B 05/08/2018  . Hepatitis A 06/12/2018  . Hepatitis B 06/12/2018  . Influenza Split 04/23/2011  . Influenza, High Dose Seasonal PF 07/11/2016, 04/10/2017, 03/27/2018  . Influenza, Seasonal, Injecte, Preservative Fre 06/09/2012  . Influenza,inj,Quad PF,6+ Mos 04/22/2014, 04/14/2015  . Janssen (J&J) SARS-COV-2 Vaccination 09/02/2019  . Moderna SARS-COVID-2 Vaccination 08/05/2019  . Pneumococcal Conjugate-13 07/11/2016  . Pneumococcal Polysaccharide-23 09/16/2017  . Tdap 12/26/2005  . Typhoid Inactivated 05/08/2018  . Zoster 05/14/2011  . Zoster Recombinat (Shingrix) 05/08/2018, 07/10/2018   She got vaccines through health dept (travel clinic) for trip to [place] (that was cancelled). [employer] forms she brought in reviewed--no tetanus documented as given then. Others have been abstracted into chart and noted above. She truly thinks she got a tetanus shot then.  Last Pap smear: 07/2018, normal. Last GYN visit with Dr. Dellis Filbert 07/2019 Last mammogram:08/2019 Last colonoscopy: 12/2011, small polyp. She called and was told that her next  colonoscopy was due 2023, not 5 years (that criteria changed) Last DEXA: 08/2019  T-2.1 R fem neck, -2.3 L fem neck, -1.9 spine, -2.1 L total femur. FRAX 18% and 4.1% Ophtho: yearly (more recently, due to problems--blurry vision, FB sensation) Dentist: every 6 months  Exercise:Not much over the last year. Since weather warmed up in the last few weeks she is riding bikes with husband, playing pickleball.  Doesn't have a routine. No weight-bearing exercise currently. Vitamin D was36 in 09/2016  Other doctors caring for patient include: GYN: Dr. Dellis Filbert Ophtho: Dr. Herbert Deaner (and Dr. Marica Otter as Optometrist) GI: Dr. Collene Mares Dermatologist: Dr. Delman Cheadle Sleep doctor: Dr. Claiborne Billings Ortho: Dr. Noemi Chapel Cardiology: previously saw Dr. Harrington Challenger, more recently Dr. Claiborne Billings (for sleep eval) Audiology: [facility]NT: Dr. Wilburn Cornelia  Depression screen:negative Fall screen: negative Functional Status screen signficant only for known chronic hearing loss (has hearing aids), known ADHD.some decrease in vision related to cataracts and interstitial keratitis Mini-cog screening normal score of 5  End of Life Discussion: Patient hasa living will and medical power of attorney. She has been asked to get copies to [place] (not yet received)  PMH, PSH ,SH and FH were reviewed and updated.  Outpatient Encounter Medications as of 09/24/2019  Medication Sig Note  . amphetamine-dextroamphetamine (ADDERALL XR) 30 MG 24 hr capsule TAKE (1) CAPSULE DAILY IN THE MORNING.   . cetirizine (ZYRTEC) 10 MG tablet Take 10 mg by mouth daily.   . Coenzyme Q10 (COQ10 PO) Take 1 tablet by mouth daily.   Marland Kitchen conjugated estrogens (PREMARIN) vaginal cream Insert 1 applicator twice weekly vaginally.   . Eszopiclone 3 MG TABS TAKE ONE TABLET IMMEDIATELY BEFORE BEDTIME.   . fluticasone (FLONASE) 50 MCG/ACT nasal spray USE 2 SPRAYS IN EACH NOSTRIL ONCE A DAY.   . Multiple Vitamin (MULTIVITAMIN) tablet Take 1 tablet by mouth daily.   .  mupirocin ointment (BACTROBAN) 2 % Place 1 application into the nose as directed.   Marland Kitchen omeprazole (PRILOSEC) 40 MG capsule TAKE 1 CAPSULE EVERY DAY.   . simvastatin (ZOCOR) 10 MG tablet Take 1 tablet (10 mg total) by mouth at bedtime.   Marland Kitchen SYNTHROID 50 MCG tablet TAKE 1 TABLET ONCE DAILY BEFORE BREAKFAST. 09/24/2019: Takes 167mcg on Sunday, 47mcg all other days  . TURMERIC PO Take by mouth daily.   . [DISCONTINUED] simvastatin (ZOCOR) 10 MG tablet Take 1 tablet (10 mg total) by mouth at bedtime.   Marland Kitchen escitalopram (LEXAPRO) 10 MG tablet Start 1/2 tablet by mouth once daily; increase to full tablet in 1-2 weeks if tolerated    No facility-administered encounter medications on file as of 09/24/2019.   NOT taking lexapro prior to today's visit  Allergies  Allergen Reactions  . Morphine And Related Other (See Comments)    Family history.  . Codeine Itching and Rash    ROS: The patient denies fever, weight changes, headaches, vision changes, ear pain, sore throat, breast concerns, chest pain, palpitations, dizziness, syncope, dyspnea on exertion, cough, swelling, nausea, vomiting, diarrhea, constipation, abdominal pain, melena, hematochezia, hematuria, incontinence, dysuria, vaginal bleeding, discharge, odor or itch, genital lesions, numbness, tingling, weakness, tremor, suspicious skin lesions,abnormal bleeding/bruising, or enlarged lymph nodes.  Denies depression, +anxiety per HPI. Insomnia is controlled with current regimen (90% of the time) Hearing loss--uses hearing aids Bilateral knee pain, previously has gotten injections in the knees prior to vacations (strenuous hiking/climbing). She reports being under the care of Dr. Noemi Chapel for R knee pain, bursitis in L knee. Some pain flared since playing pickleball. Reflux controlled, per HPI Nasal congestion/blockage, affecting her ability to use her CPAP (nasal), per HPI, surgery planned. Fatigue, irritability and anxiety per HPI. Recently developed  a scar above L lip   PHYSICAL EXAM:  BP 120/72   Pulse 68   Temp (!) 96.9 F (36.1 C) (Other (Comment))   Ht 4' 10.5" (1.486 m)   Wt 111 lb 6.4 oz (50.5 kg)   BMI 22.89 kg/m   Wt Readings from Last 3 Encounters:  09/24/19 111 lb 6.4 oz (50.5 kg)  07/14/19 111 lb (50.3 kg)  09/18/18 110 lb 9.6 oz (50.2 kg)    General Appearance:  Alert, cooperative, pleasant patient in no distress; appears stated age. She declined to change into gown for today's visit.  Head:  Normocephalic, without obvious abnormality, atraumatic   Eyes:  PERRL, conjunctiva/corneas clear, EOM's intact, fundi benign   Ears:  Normal TM's and external ear canals   Nose:  Not examined, wearing mask due to COVID-19 pandemic  Throat:  Not examined, wearing mask due to COVID-19 pandemic  Neck:  Supple, no lymphadenopathy; thyroid: no enlargement/tenderness/nodules; no carotid  bruit or JVD   Back:  Spine nontender, no curvature, ROM normal, no CVA tenderness   Lungs:  Clear to auscultation bilaterally without wheezes, rales or ronchi; respirations unlabored   Chest Wall:  No tenderness or deformity   Heart:  Regular rate and rhythm, S1 and S2 normal, no murmur, rub or gallop   Breast Exam:  Deferred to GYN   Abdomen:  Soft, non-tender, nondistended, normoactive bowel sounds, no masses,  no hepatosplenomegaly   Genitalia:  Deferred to GYN      Extremities:  No clubbing, cyanosis or edema. No effusion or warmth at knees  Pulses:  2+ and symmetric all extremities   Skin:  Skin color, texture, turgor normal. Exam is limited, not undressed. She briefly pulled down mask--small raised area midway between nose and L side of lip.   Lymph nodes:  Cervical and supraclavicular, inguinal nodes normal   Neurologic:  Normal strength, sensation and gait; reflexes 2+ and symmetric throughout                 Psych:   Normal mood, full range of affect. Normal  hygiene, grooming, eye contact and speech. Somewhat irritable/frustrated  GAD-7 score of 19  Lab Results  Component Value Date   TSH 1.270 09/22/2019   Lab Results  Component Value Date   CHOL 202 (H) 09/22/2019   HDL 70 09/22/2019   LDLCALC 113 (H) 09/22/2019   TRIG 107 09/22/2019   CHOLHDL 2.9 09/22/2019     Chemistry      Component Value Date/Time   NA 142 09/22/2019 0855   K 4.9 09/22/2019 0855   CL 103 09/22/2019 0855   CO2 27 09/22/2019 0855   BUN 11 09/22/2019 0855   CREATININE 0.63 09/22/2019 0855   CREATININE 0.63 07/05/2016 0724      Component Value Date/Time   CALCIUM 9.4 09/22/2019 0855   ALKPHOS 64 09/22/2019 0855   AST 19 09/22/2019 0855   ALT 11 09/22/2019 0855   BILITOT 0.4 09/22/2019 0855     Fasting glucose 93  Lab Results  Component Value Date   WBC 5.5 09/22/2019   HGB 12.5 09/22/2019   HCT 35.5 09/22/2019   MCV 86 09/22/2019   PLT 239 09/22/2019    ASSESSMENT/PLAN:  Annual physical exam  Medicare annual wellness visit, subsequent  Hypothyroidism, unspecified type - adequately replaced on current dose, continue  Attention deficit disorder, unspecified hyperactivity presence - not doing as well, but likely contributed by poor sleep and OSA. No change to medication at this time.  Osteopenia of necks of both femurs - discussed in detail, counseled re: meds--needs to talk to dentist prior to starting bisphophonate. Disc Ca, D, weight-bearing exercise  High risk for hip fracture - recommended treatment with bisphosphonate. Will discuss with dentist since upcoming dentalwork needed.  OSA (obstructive sleep apnea) - not tolerating CPAP well due to nasal issues.  Surgery planned.  Insomnia, unspecified type - controlled with Lunesta. Advised pt that we will refill when needed next, and can try changing to 90d rx  Gastroesophageal reflux disease, unspecified whether esophagitis present - controlled, continues to need daily PPI.    Generalized anxiety disorder - start lexapro. Encouraged counseling, discussed stress reduction/mindfulness - Plan: escitalopram (LEXAPRO) 10 MG tablet  Pure hypercholesterolemia - controlled; continue statin - Plan: simvastatin (ZOCOR) 10 MG tablet   Discussed monthly self breast exams and yearly mammograms; at least 30 minutes of aerobic activity at least 5 days/week, weight-bearing exercise 2x/wk; proper  sunscreen use reviewed; healthy diet, including goals of calcium and vitamin D intake and alcohol recommendations (less than or equal to 1 drink/day) reviewed; regular seatbelt use; changing batteries in smoke detectors. Immunization recommendations discussed--no record of tetanus given from [facility].  She believes it was given, possibly missed in putting in [employer]? Pt to check with Health Dept directly, and if they have no record that it was given, she needs to get it from the pharmacy.Continue yearly high dose flu shots.  Colonoscopy recommendations reviewed--UTD Full Code, Full Care. Reminded patient to get [place] copies of her living will and healthcare power of attorney.  F/u 6 weeks Can be virtual  Total FTF time with patient was 70 minutes. Additional time spent in chart review and documentation.    Medicare Attestation I have personally reviewed: The patient's medical and social history Their use of alcohol, tobacco or illicit drugs Their current medications and supplements The patient's functional ability including ADLs,fall risks, home safety risks, cognitive, and hearing and visual impairment Diet and physical activities Evidence for depression or mood disorders  The patient's weight, height, BMI have been recorded in the chart.  I have made referrals, counseling, and provided education to the patient based on review of the above and I have provided the patient with a written personalized care plan for preventive services.

## 2019-09-23 NOTE — Patient Instructions (Addendum)
HEALTH MAINTENANCE RECOMMENDATIONS:  It is recommended that you get at least 30 minutes of aerobic exercise at least 5 days/week (for weight loss, you may need as much as 60-90 minutes). This can be any activity that gets your heart rate up. This can be divided in 10-15 minute intervals if needed, but try and build up your endurance at least once a week.  Weight bearing exercise is also recommended twice weekly.  Eat a healthy diet with lots of vegetables, fruits and fiber.  "Colorful" foods have a lot of vitamins (ie green vegetables, tomatoes, red peppers, etc).  Limit sweet tea, regular sodas and alcoholic beverages, all of which has a lot of calories and sugar.  Up to 1 alcoholic drink daily may be beneficial for women (unless trying to lose weight, watch sugars).  Drink a lot of water.  Calcium recommendations are 1200-1500 mg daily (1500 mg for postmenopausal women or women without ovaries), and vitamin D 1000 IU daily.  This should be obtained from diet and/or supplements (vitamins), and calcium should not be taken all at once, but in divided doses.  Monthly self breast exams and yearly mammograms for women over the age of 24 is recommended.  Sunscreen of at least SPF 30 should be used on all sun-exposed parts of the skin when outside between the hours of 10 am and 4 pm (not just when at beach or pool, but even with exercise, golf, tennis, and yard work!)  Use a sunscreen that says "broad spectrum" so it covers both UVA and UVB rays, and make sure to reapply every 1-2 hours.  Remember to change the batteries in your smoke detectors when changing your clock times in the spring and fall. Carbon monoxide detectors are recommended for your home.  Use your seat belt every time you are in a car, and please drive safely and not be distracted with cell phones and texting while driving.   Ms. Richhart , Thank you for taking time to come for your Medicare Wellness Visit. I appreciate your ongoing  commitment to your health goals. Please review the following plan we discussed and let me know if I can assist you in the future.   This is a list of the screening recommended for you and due dates:  Health Maintenance  Topic Date Due  . Tetanus Vaccine  12/27/2015  . Mammogram  09/16/2020  . Colon Cancer Screening  12/18/2021  . Flu Shot  Completed  . DEXA scan (bone density measurement)  Completed  .  Hepatitis C: One time screening is recommended by Center for Disease Control  (CDC) for  adults born from [birth year] through 1965.   Completed  . Pneumonia vaccines  Completed   Reviewing the information from the health department--they did not document having given you a tetanus shot in November or December 2019 (none at all since 2007) in the [place] system.  It could be possible that they made an error and didn't document in that system.  You can either get another tetanus booster (from the pharmacy, since Medicare doesn't pay for [place] to give it in the office) or check with the health department to see if they have another way to see if they gave you one or not (and then they can correct the error).  Bone density test shows slight worsening of osteopenia (thinning of the bones) with elevated risk for hip fracture.  Treatment is recommended, as we discussed, and then repeat bone density test would be due  in 2 years. Please discuss the use of bisphosphonates with your dentist, since you have upcoming dentalwork, to see if we need to hold off on starting the medication until after it is completed.  Start taking lexapro 1/2 tablet by mouth every morning (if it makes you sleepy you can change it to bedtime dosing).  Stay on the 1/2 dose for at least a week. If you aren't having any side effects then increase to the full tablet.  If you cannot tolerate the full dose, stay at the 1/2 tablet. Work on stress reduction/mindfulness techniques.  Regular exercise, good sleep (which will come soon enough) are all  important.  Counseling is strongly encouraged.   Mindfulness-Based Stress Reduction Mindfulness-based stress reduction (MBSR) is a program that helps people learn to practice mindfulness. Mindfulness is the practice of intentionally paying attention to the present moment. It can be learned and practiced through techniques such as education, breathing exercises, meditation, and yoga. MBSR includes several mindfulness techniques in one program. MBSR works best when you understand the treatment, are willing to try new things, and can commit to spending time practicing what you learn. MBSR training may include learning about:  How your emotions, thoughts, and reactions affect your body.  New ways to respond to things that cause negative thoughts to start (triggers).  How to notice your thoughts and let go of them.  Practicing awareness of everyday things that you normally do without thinking.  The techniques and goals of different types of meditation. What are the benefits of MBSR? MBSR can have many benefits, which include helping you to:  Develop self-awareness. This refers to knowing and understanding yourself.  Learn skills and attitudes that help you to participate in your own health care.  Learn new ways to care for yourself.  Be more accepting about how things are, and let things go.  Be less judgmental and approach things with an open mind.  Be patient with yourself and trust yourself more. MBSR has also been shown to:  Reduce negative emotions, such as depression and anxiety.  Improve memory and focus.  Change how you sense and approach pain.  Boost your body's ability to fight infections.  Help you connect better with other people.  Improve your sense of well-being. Follow these instructions at home:   Find a local in-person or online MBSR program.  Set aside some time regularly for mindfulness practice.  Find a mindfulness practice that works best for you.  This may include one or more of the following: ? Meditation. Meditation involves focusing your mind on a certain thought or activity. ? Breathing awareness exercises. These help you to stay present by focusing on your breath. ? Body scan. For this practice, you lie down and pay attention to each part of your body from head to toe. You can identify tension and soreness and intentionally relax parts of your body. ? Yoga. Yoga involves stretching and breathing, and it can improve your ability to move and be flexible. It can also provide an experience of testing your body's limits, which can help you release stress. ? Mindful eating. This way of eating involves focusing on the taste, texture, color, and smell of each bite of food. Because this slows down eating and helps you feel full sooner, it can be an important part of a weight-loss plan.  Find a podcast or recording that provides guidance for breathing awareness, body scan, or meditation exercises. You can listen to these any time when you  have a free moment to rest without distractions.  Follow your treatment plan as told by your health care provider. This may include taking regular medicines and making changes to your diet or lifestyle as recommended. How to practice mindfulness To do a basic awareness exercise:  Find a comfortable place to sit.  Pay attention to the present moment. Observe your thoughts, feelings, and surroundings just as they are.  Avoid placing judgment on yourself, your feelings, or your surroundings. Make note of any judgment that comes up, and let it go.  Your mind may wander, and that is okay. Make note of when your thoughts drift, and return your attention to the present moment. To do basic mindfulness meditation:  Find a comfortable place to sit. This may include a stable chair or a firm floor cushion. ? Sit upright with your back straight. Let your arms fall next to your side with your hands resting on your  legs. ? If sitting in a chair, rest your feet flat on the floor. ? If sitting on a cushion, cross your legs in front of you.  Keep your head in a neutral position with your chin dropped slightly. Relax your jaw and rest the tip of your tongue on the roof of your mouth. Drop your gaze to the floor. You can close your eyes if you like.  Breathe normally and pay attention to your breath. Feel the air moving in and out of your nose. Feel your belly expanding and relaxing with each breath.  Your mind may wander, and that is okay. Make note of when your thoughts drift, and return your attention to your breath.  Avoid placing judgment on yourself, your feelings, or your surroundings. Make note of any judgment or feelings that come up, let them go, and bring your attention back to your breath.  When you are ready, lift your gaze or open your eyes. Pay attention to how your body feels after the meditation. Where to find more information You can find more information about MBSR from:  Your health care provider.  Community-based meditation centers or programs.  Programs offered near you. Summary  Mindfulness-based stress reduction (MBSR) is a program that teaches you how to intentionally pay attention to the present moment. It is used with other treatments to help you cope better with daily stress, emotions, and pain.  MBSR focuses on developing self-awareness, which allows you to respond to life stress without judgment or negative emotions.  MBSR programs may involve learning different mindfulness practices, such as breathing exercises, meditation, yoga, body scan, or mindful eating. Find a mindfulness practice that works best for you, and set aside time for it on a regular basis. This information is not intended to replace advice given to you by your health care provider. Make sure you discuss any questions you have with your health care provider. Document Revised: 05/31/2017 Document Reviewed:  10/25/2016 Elsevier Patient Education  [place].   Osteopenia  Osteopenia is a loss of thickness (density) inside of the bones. Another name for osteopenia is low bone mass. Mild osteopenia is a normal part of aging. It is not a disease, and it does not cause symptoms. However, if you have osteopenia and continue to lose bone mass, you could develop a condition that causes the bones to become thin and break more easily (osteoporosis). You may also lose some height, have back pain, and have a stooped posture. Although osteopenia is not a disease, making changes to your lifestyle  and diet can help to prevent osteopenia from developing into osteoporosis. What are the causes? Osteopenia is caused by loss of calcium in the bones.  Bones are constantly changing. Old bone cells are continually being replaced with new bone cells. This process builds new bone. The mineral calcium is needed to build new bone and maintain bone density. Bone density is usually highest around age 69. After that, most people's bodies cannot replace all the bone they have lost with new bone. What increases the risk? You are more likely to develop this condition if:  You are older than age 59.  You are a woman who went through menopause early.  You have a long illness that keeps you in bed.  You do not get enough exercise.  You lack certain nutrients (malnutrition).  You have an overactive thyroid gland (hyperthyroidism).  You smoke.  You drink a lot of alcohol.  You are taking medicines that weaken the bones, such as steroids. What are the signs or symptoms? This condition does not cause any symptoms. You may have a slightly higher risk for bone breaks (fractures), so getting fractures more easily than normal may be an indication of osteopenia. How is this diagnosed? Your health care provider can diagnose this condition with a special type of X-ray exam that measures bone density (dual-energy X-ray  absorptiometry, DEXA). This test can measure bone density in your hips, spine, and wrists. Osteopenia has no symptoms, so this condition is usually diagnosed after a routine bone density screening test is done for osteoporosis. This routine screening is usually done for:  Women who are age 43 or older.  Men who are age 53 or older. If you have risk factors for osteopenia, you may have the screening test at an earlier age. How is this treated? Making dietary and lifestyle changes can lower your risk for osteoporosis. If you have severe osteopenia that is close to becoming osteoporosis, your health care provider may prescribe medicines and dietary supplements such as calcium and vitamin D. These supplements help to rebuild bone density. Follow these instructions at home:   Take over-the-counter and prescription medicines only as told by your health care provider. These include vitamins and supplements.  Eat a diet that is high in calcium and vitamin D. ? Calcium is found in dairy products, beans, salmon, and leafy green vegetables like spinach and broccoli. ? Look for foods that have vitamin D and calcium added to them (fortified foods), such as orange juice, cereal, and bread.  Do 30 or more minutes of a weight-bearing exercise every day, such as walking, jogging, or playing a sport. These types of exercises strengthen the bones.  Take precautions at home to lower your risk of falling, such as: ? Keeping rooms well-lit and free of clutter, such as cords. ? Installing safety rails on stairs. ? Using rubber mats in the bathroom or other areas that are often wet or slippery.  Do not use any products that contain nicotine or tobacco, such as cigarettes and e-cigarettes. If you need help quitting, ask your health care provider.  Avoid alcohol or limit alcohol intake to no more than 1 drink a day for nonpregnant women and 2 drinks a day for men. One drink equals 12 oz of beer, 5 oz of wine, or 1  oz of hard liquor.  Keep all follow-up visits as told by your health care provider. This is important. Contact a health care provider if:  You have not had a bone  density screening for osteoporosis and you are: ? A woman, age 49 or older. ? A man, age 27 or older.  You are a postmenopausal woman who has not had a bone density screening for osteoporosis.  You are older than age 72 and you want to know if you should have bone density screening for osteoporosis. Summary  Osteopenia is a loss of thickness (density) inside of the bones. Another name for osteopenia is low bone mass.  Osteopenia is not a disease, but it may increase your risk for a condition that causes the bones to become thin and break more easily (osteoporosis).  You may be at risk for osteopenia if you are older than age 50 or if you are a woman who went through early menopause.  Osteopenia does not cause any symptoms, but it can be diagnosed with a bone density screening test.  Dietary and lifestyle changes are the first treatment for osteopenia. These may lower your risk for osteoporosis. This information is not intended to replace advice given to you by your health care provider. Make sure you discuss any questions you have with your health care provider. Document Revised: 05/31/2017 Document Reviewed: 03/27/2017 Elsevier Patient Education  2020 [employer].

## 2019-09-24 ENCOUNTER — Other Ambulatory Visit: Payer: Self-pay

## 2019-09-24 ENCOUNTER — Ambulatory Visit: Payer: Medicare PPO | Admitting: Family Medicine

## 2019-09-24 ENCOUNTER — Encounter: Payer: Self-pay | Admitting: Family Medicine

## 2019-09-24 VITALS — BP 120/72 | HR 68 | Temp 96.9°F | Ht 58.5 in | Wt 111.4 lb

## 2019-09-24 DIAGNOSIS — F411 Generalized anxiety disorder: Secondary | ICD-10-CM

## 2019-09-24 DIAGNOSIS — Z0000 Encounter for general adult medical examination without abnormal findings: Secondary | ICD-10-CM | POA: Diagnosis not present

## 2019-09-24 DIAGNOSIS — K219 Gastro-esophageal reflux disease without esophagitis: Secondary | ICD-10-CM

## 2019-09-24 DIAGNOSIS — F988 Other specified behavioral and emotional disorders with onset usually occurring in childhood and adolescence: Secondary | ICD-10-CM

## 2019-09-24 DIAGNOSIS — G4733 Obstructive sleep apnea (adult) (pediatric): Secondary | ICD-10-CM

## 2019-09-24 DIAGNOSIS — Z9189 Other specified personal risk factors, not elsewhere classified: Secondary | ICD-10-CM | POA: Diagnosis not present

## 2019-09-24 DIAGNOSIS — G4700 Insomnia, unspecified: Secondary | ICD-10-CM | POA: Diagnosis not present

## 2019-09-24 DIAGNOSIS — M85851 Other specified disorders of bone density and structure, right thigh: Secondary | ICD-10-CM | POA: Diagnosis not present

## 2019-09-24 DIAGNOSIS — M85852 Other specified disorders of bone density and structure, left thigh: Secondary | ICD-10-CM

## 2019-09-24 DIAGNOSIS — E039 Hypothyroidism, unspecified: Secondary | ICD-10-CM

## 2019-09-24 DIAGNOSIS — E7800 Pure hypercholesterolemia, unspecified: Secondary | ICD-10-CM

## 2019-09-24 MED ORDER — ESCITALOPRAM OXALATE 10 MG PO TABS: ORAL_TABLET | ORAL | 1 refills | Status: DC

## 2019-09-24 MED ORDER — SIMVASTATIN 10 MG PO TABS: 10.0000 mg | ORAL_TABLET | Freq: Every day | ORAL | 3 refills | Status: DC

## 2019-09-26 ENCOUNTER — Encounter: Payer: Self-pay | Admitting: Family Medicine

## 2019-10-01 ENCOUNTER — Encounter: Payer: Self-pay | Admitting: Family Medicine

## 2019-10-01 DIAGNOSIS — G4700 Insomnia, unspecified: Secondary | ICD-10-CM

## 2019-10-01 MED ORDER — ESZOPICLONE 3 MG PO TABS: ORAL_TABLET | ORAL | 0 refills | Status: DC

## 2019-10-06 ENCOUNTER — Telehealth: Payer: Self-pay

## 2019-10-06 ENCOUNTER — Telehealth (INDEPENDENT_AMBULATORY_CARE_PROVIDER_SITE_OTHER): Payer: Medicare PPO | Admitting: Cardiovascular Disease

## 2019-10-06 ENCOUNTER — Encounter: Payer: Self-pay | Admitting: Cardiovascular Disease

## 2019-10-06 VITALS — BP 120/72 | HR 68 | Ht 58.5 in | Wt 111.0 lb

## 2019-10-06 DIAGNOSIS — E039 Hypothyroidism, unspecified: Secondary | ICD-10-CM | POA: Diagnosis not present

## 2019-10-06 DIAGNOSIS — G4733 Obstructive sleep apnea (adult) (pediatric): Secondary | ICD-10-CM | POA: Diagnosis not present

## 2019-10-06 DIAGNOSIS — E785 Hyperlipidemia, unspecified: Secondary | ICD-10-CM

## 2019-10-06 DIAGNOSIS — J342 Deviated nasal septum: Secondary | ICD-10-CM | POA: Diagnosis not present

## 2019-10-06 DIAGNOSIS — F988 Other specified behavioral and emotional disorders with onset usually occurring in childhood and adolescence: Secondary | ICD-10-CM | POA: Diagnosis not present

## 2019-10-06 DIAGNOSIS — F909 Attention-deficit hyperactivity disorder, unspecified type: Secondary | ICD-10-CM

## 2019-10-06 DIAGNOSIS — Z9989 Dependence on other enabling machines and devices: Secondary | ICD-10-CM

## 2019-10-06 DIAGNOSIS — M8580 Other specified disorders of bone density and structure, unspecified site: Secondary | ICD-10-CM

## 2019-10-06 NOTE — Patient Instructions (Signed)
Medication Instructions:  CONTINUE WITH CURRENT MEDICATIONS. NO CHANGES.  *If you need a refill on your cardiac medications before your next appointment, please call your pharmacy*   Follow-Up: At [facility], you and your health needs are our priority.  As part of our continuing mission to provide you with exceptional heart care, we have created designated Provider Care Teams.  These Care Teams include your primary Cardiologist (physician) and Advanced Practice Providers (APPs -  Physician Assistants and Nurse Practitioners) who all work together to provide you with the care you need, when you need it.  We recommend signing up for the patient portal called "MyChart".  Sign up information is provided on this After Visit Summary.  MyChart is used to connect with patients for Virtual Visits (Telemedicine).  Patients are able to view lab/test results, encounter notes, upcoming appointments, etc.  Non-urgent messages can be sent to your provider as well.   To learn more about what you can do with MyChart, go to [email or web address].    Your next appointment:   12 month(s)  The format for your next appointment:   In Person  Provider:   Shelva Majestic, MD   Other Instructions New mask from choice [employer] [id number]

## 2019-10-06 NOTE — Progress Notes (Signed)
Virtual Visit via Video Note   This visit type was conducted due to national recommendations for restrictions regarding the COVID-19 Pandemic (e.g. social distancing) in an effort to limit this patient's exposure and mitigate transmission in our community.  Due to her co-morbid illnesses, this patient is at least at moderate risk for complications without adequate follow up.  This format is felt to be most appropriate for this patient at this time.  All issues noted in this document were discussed and addressed.  A limited physical exam was performed with this format.  Please refer to the patient's chart for her consent to telehealth for Nicole Allen.   The patient was identified using 2 identifiers.  Date:  10/06/2019   ID:  Nicole Allen, DOB Feb 27, 1951, MRN [id number]  Patient Location: Home Provider Location: Home  PCP:  Rita Ohara, MD  Cardiologist:  Shelva Majestic, MD  Electrophysiologist:  None   Evaluation Performed:  Follow-Up Visit  Chief Complaint: 1 year evaluation with chief complaint of recent development of severe deviated septum and need for surgery with turbinate reduction.  History of Present Illness:    Nicole Allen is a 69 y.o. female who has a history of obstructive sleep apnea and presents for a 1 year evaluation.  Nicole Allen had undergone a sleep study interpreted by [name] in August 2017.  The patient  continued to have significant difficulty with sleep and was referred by [name] to me for sleep evaluation and possible restless legs.  I saw her for initial evaluation on 04/16/2017.  Nicole Allen has a history of hypothyroidism, periodic limb movements of sleep, attention deficit disorder, hyperlipidemia, and GERD.  She was having significant difficulty with sleep with some insomnia and ultimately was referred for sleep study in 02/24/2016 which was interpreted by Dr. Annamaria Boots.  I personally reviewed this sleep study in detail.  The patient had  reduced sleep efficiency at 51.7%.  She had markedly prolonged rim latency at 266 minutes.  She also had significant reduction in REM sleep at only 4%.  Wake after sleep onset was 137 minutes.  She was found to have an overall AHI of 4.5 per hour.  Do not see any mention of the respiratory disturbance index, but I suspect this was increased based on at least 17 respiratory effort related arousals.  AHI while sleeping supine, was 11.7 and consistent with mild sleep apnea.  She had moderate snoring.  There was no significant oxygen desaturation.  She had slight increase in periodic limb movements of sleep at 51, resulting in index of 16.36.  Associated arousals with leg movement index was 5.8. When I initially saw her, she the patient denied any urge to move or painful restless legs , and her symptoms were not suggestive of restless leg syndrome.  Reportedly, she was told to have a trial of Mirapex by [name], which she never did.  She had difficulty with sleep initiation and maintenance.  When I initially saw her she was having difficulty particularly with sleep initiation and maintenance.  She typically goes to bed between 10 and 11 PM at night, but may stay awake for several hours before she falls asleep.  However, if she stays up until 1 AM and tries to go to sleep then she typically falls asleep in about 30 minutes.  She often wakes up between 8:30 AM and 10 AM.  She notes frequent awakenings.  She was given a prescription for trazodone for  sleep, which has not been very helpful.  She did not tolerate Ambien in the past.  She was complaining of being tired.  When the day and was at a point where she not feel safe driving toward the daytime since she may fall a sleep.   When I saw her, I recommended a trial of Lunesta 2 mg, which would be helpful both for sleep initiation and maintenance.  She had issues with her insurance company covering this, but ultimately she instituted this therapy.  I referred her  for follow-up sleep study which was done on 06/08/2017.  Unfortunately, this was the night of the snowstorm.  She did not have a good experience in the lab, particularly with reference to the sleep tech.  She often sleeps with a sound machine and the tech would not let her use it as result, she was very anxious and again had difficulty with sleep initiation.  Of note, since starting the Lunesta at home, she states that this has been very effective 90% of the time and allowing her to fall sleep within 30 minutes and usually sleep duration and maintenance was significantly improved.  Unfortunately, the night of the sleep study because of difficulty with sleep initiation, a cold environment, and not feeling comfortable with tech sleep efficiency was poor at 51.4%.  Wake after sleep onset was 161 minutes.  Her AHIwas only minimally increased at 5.4 per hour with an RDI of 6.9 per hour.  There was a positional component with supine sleep, AHI of 11.6.  She was unable to achieve rems sleep.  In contrast, since she had been on Lunesta at home and was sleeping for longer duration.  She had noticed over the past month that she was dreaming.  Interestingly, she feels that since she is started [name].  She is not moving her legs were feeling this jittery sensation.  In contrast to her previous sleep study, on her most recent sleep study she did not have any periodic limb movement disorder of sleep.  She continues to take Aldderiall XR for her ADD.  She is on omeprazole for GERD.  She takes simvastatin 10 mg at bedtime for hyperlipidemia.  She also is on levothyroxine 50 g.    At her office visit on December 17,2018 I further titrated Lunesta to 3 mg daily.  She believes that she is sleeping and dreaming well.  However she continues to experience significant fatigue.  She typically goes to bed at 11 PM.  She often reads a Kindl before going to sleep.    She underwent repeat sleep evaluation and had a diagnostic  polysomnogram on February 25, 2018.  AHI was 9.6/h, RDI 16.9/h.  AHI during supine sleep was 60.9/h.  Minimum oxygen saturation was 89%.  He underwent a CPAP titration trial on May 06, 2018 and was titrated up to 11 cm water pressure.  AHI at 11 cm was 0 with an oxygen nadir of 95%.  During that study, he had taken her almost 2 hours to fall asleep and this was predominantly due to a sense of cold air on her upper lip.  Apparently she was given a full facemask for her titration trial.  She wanted to further discuss CPAP prior to implementing treatment and ice saw her May 19, 2018 for follow-up evaluation.  I saw her in February 2020 for her follow-up evaluation.  Nicole Allen was utilizing CPAP therapy and noted significant improvement in her energy level and felt better.  I  obtained a download in the office  from July 26, 2018 through August 24, 2018.  She has excellent compliance and is averaging 7 hours and 45 minutes of use per night.  She has been set at an auto pressure of 6-14.  AHI is  1.9.  There is no leak.  Her 95th percentile pressure is 10.2 with a maximum average pressure of 11.5.  Time she does note some intermittent stuffy nose and when this occurs the nasal treatment is not as smooth since she feels she may have to breathe at times from her mouth.  He denies chest pain PND orthopnea.  She denies residual daytime sleepiness.   Over the past year, she has done fairly well.  However recently she started to notice that she was having more difficulty with sleep.  She ultimately underwent an ENT evaluation with Dr. Wilburn Cornelia and was found to have almost complete occlusion of her right nares due to a markedly deviated septum that had a 90 degree angle.  As result she was not able to breathe of any significance through her right nostril.  She is tentatively set to undergo repair of her deviated septum as well as turbinate reduction on Nov 11, 2019.  Presently she has still been using her CPAP  and has nasal pillows.  I obtained a download of the past month which continues to show excellent compliance however her sleep duration is less and now averaging 6 hours and 25 minutes per night.  She states at times she has to take the CPAP off because she does not feel that she is getting enough air through her nasal pillows.  Her AHI is now increased at 5.9 reflective of her inability to have adequate CPAP particularly through her clogged right nostril.  She tells me she will be traveling to [place] on Saturday for 2 weeks.  She has noticed her blood pressure has been stable and her pulse regular and had recently been evaluated.  She denies any episodes of chest pain or palpitations.  She is unaware of any nocturnal arrhythmia.  She has had issues with progressively decreasing bone density and will require initiation of treatment.  However this will be deferred until she has dental work done.  She presents for telemedicine evaluation.   The patient does not have symptoms concerning for COVID-19 infection (fever, chills, cough, or new shortness of breath).    Past Medical History:  Diagnosis Date  . ADD (attention deficit disorder)   . Allergy    SEASONAL  . BCC (basal cell carcinoma of skin)    chest; Dr. Tonia Brooms  . BCC (basal cell carcinoma)    Tonia Brooms  . Cervical spondylosis   . Hearing loss    high frequency (Dr. Prescott Parma); hearing aids recommended; bilateral  . History of kidney stones    x3  . Hypothyroidism 2015  . Insomnia    worse with menopause  . Interstitial keratitis 2021   under care of ophtho  . Kidney stone    x3, Dr. Reece Agar  . Osteopenia   . Postmenopausal    on HRT--sees GYN  . Pure hypercholesterolemia    Past Surgical History:  Procedure Laterality Date  . CATARACT EXTRACTION Bilateral L 10/18/15 R 12/13/15   Dr. Herbert Deaner  . CESAREAN SECTION  [id number]  . COLONOSCOPY  2003, 2013   Dr. Collene Mares  . HYMENECTOMY  1993  . KNEE SURGERY Right 09/29/2014   Dr. Jim Desanctis  replacement/reconstruction  . TONSILLECTOMY AND ADENOIDECTOMY    .  TUBAL LIGATION  1991     Current Meds  Medication Sig  . amphetamine-dextroamphetamine (ADDERALL XR) 30 MG 24 hr capsule TAKE (1) CAPSULE DAILY IN THE MORNING.  . cetirizine (ZYRTEC) 10 MG tablet Take 10 mg by mouth daily.  . Coenzyme Q10 (COQ10 PO) Take 1 tablet by mouth daily.  Marland Kitchen conjugated estrogens (PREMARIN) vaginal cream Insert 1 applicator twice weekly vaginally.  Marland Kitchen escitalopram (LEXAPRO) 10 MG tablet Start 1/2 tablet by mouth once daily; increase to full tablet in 1-2 weeks if tolerated  . Eszopiclone 3 MG TABS TAKE ONE TABLET IMMEDIATELY BEFORE BEDTIME.  . fluticasone (FLONASE) 50 MCG/ACT nasal spray USE 2 SPRAYS IN EACH NOSTRIL ONCE A DAY.  . Multiple Vitamin (MULTIVITAMIN) tablet Take 1 tablet by mouth daily.  . mupirocin ointment (BACTROBAN) 2 % Place 1 application into the nose as directed.  Marland Kitchen omeprazole (PRILOSEC) 40 MG capsule TAKE 1 CAPSULE EVERY DAY.  . simvastatin (ZOCOR) 10 MG tablet Take 1 tablet (10 mg total) by mouth at bedtime.  Marland Kitchen SYNTHROID 50 MCG tablet TAKE 1 TABLET ONCE DAILY BEFORE BREAKFAST.  Marland Kitchen TURMERIC PO Take by mouth daily.     Allergies:   Morphine and related and Codeine   Social History   Tobacco Use  . Smoking status: Never Smoker  . Smokeless tobacco: Never Used  Substance Use Topics  . Alcohol use: Yes    Alcohol/week: 0.0 standard drinks    Comment: 3-5/week  . Drug use: No     Family Hx: The patient's family history includes ADD / ADHD in her daughter and son; Alzheimer's disease in her maternal grandmother; Arthritis in her mother; Asthma in her mother; Cancer in her maternal grandfather and paternal grandfather; Heart disease in her father; Hyperlipidemia in her father and mother; Hypertension in her father and mother; Osteoporosis in her mother; Sleep apnea in her sister.  ROS:   Please see the history of present illness.    No fevers chills night sweats. Difficulty  breathing through her nose particularly with a significant deviated septum limiting airflow and causing obstruction to her right nostril  No wheezing No palpitations No chest pain No abdominal discomfort Osteopenia Attention deficit disorder No painful restless legs  All other systems reviewed and are negative.   Prior CV studies:   The following studies were reviewed today:  I have reviewed her prior sleep studies. I have obtained letter of her most recent CPAP use over the past month from Nov 07, 2019 to October 06, 2019 which shows usage every day with average usage 6 hours and 26 minutes.  AHI 5.9 with AutoPap set at 6-14 pressure with 95th percentile pressure at 11.3 and maximum average pressure at 12.4.  There is no mask leak.  Labs/Other Tests and Data Reviewed:    EKG: I personally reviewed the ECG from August 25, 2018 which showed normal sinus rhythm at 71 bpm with normal intervals.  Recent Labs: 09/22/2019: ALT 11; BUN 11; Creatinine, Ser 0.63; Hemoglobin 12.5; Platelets 239; Potassium 4.9; Sodium 142; TSH 1.270   Recent Lipid Panel Lab Results  Component Value Date/Time   CHOL 202 (H) 09/22/2019 08:55 AM   TRIG 107 09/22/2019 08:55 AM   HDL 70 09/22/2019 08:55 AM   CHOLHDL 2.9 09/22/2019 08:55 AM   CHOLHDL 2.1 07/05/2016 07:24 AM   LDLCALC 113 (H) 09/22/2019 08:55 AM    Wt Readings from Last 3 Encounters:  10/06/19 111 lb (50.3 kg)  09/24/19 111 lb 6.4 oz (50.5  kg)  07/14/19 111 lb (50.3 kg)     Objective:    Vital Signs:  BP 120/72   Pulse 68   Ht 4' 10.5" (1.486 m)   Wt 111 lb (50.3 kg)   BMI 22.80 kg/m    This was a video visit. Appearance was unchanged from previously She was breathing normally There was no audible wheezing There did not appear to be any JVD Her pulse was regular by her palpation There was no abdominal pain She denied any leg swelling She denied any neurologic symptoms She had normal affect and mood  ASSESSMENT & PLAN:     1. Obstructive sleep apnea: I have previously reviewed her sleep studies from 2017 and more recently August 2019.  She had mild sleep apnea overall but very severe sleep apnea with supine posture.  When I had seen her 1 year ago she had noticed marked benefit with with CPAP and AHI was excellent at 1.9 with average sleep duration at 7 hours of 45 minutes.  She has subsequently developed progressive obstruction to her right nostril secondary to a significant deviated septum.  She has been using nasal pillows for her CPAP and I suspect this accounts for her AHI now increased at 5.9 particularly since her right nostril is essentially clogged.  She is tentatively scheduled to undergo surgery for her deviated septum with turbinate reduction which will significantly improve her symptoms.  In the interim, I will try to contact [facility] to see if she can receive a new ResMed air fit F 30i mask which will avoid nasal pillows but still give some nasal treatment as well as full facemask since her nose is clogged.  Hopefully this will prevent her from taking her mask off at night which at times she has had to move the air.  I will personally call Anderson Malta at [facility] tomorrow.  Patient will be leaving for a 2-week trip to [place] driving on Saturday and hopefully she will be able to be provided a new mask prior to her departure.  2. Deviated septum: Patient was evaluated by Dr. Wilburn Cornelia and will be undergoing surgery on Nov 11, 2019 for repair of her deviated septum as well as turbinate reduction.  She has been set on an AutoPap pressure.  Pressure adjustment can be made postoperatively when she is able to reinstitute therapy. 3. History of sleep initiation and maintenance: This has significantly improved with the addition of Lunesta. 4. ADD: She continues to take Adderall XR. 5. Hyperlipidemia: Currently on simvastatin. 6. History of hypothyroidism, continues to be on  levothyroxine. 7. Osteopenia: She was told of having increased risk for hip fractures and will be initiated therapy in the future per her primary provider.  COVID-19 Education: The signs and symptoms of COVID-19 were discussed with the patient and how to seek care for testing (follow up with PCP or arrange E-visit).  The importance of social distancing was discussed today.  Time:   Today, I have spent 25 minutes with the patient with telehealth technology discussing the above problems.     Medication Adjustments/Labs and Tests Ordered: Current medicines are reviewed at length with the patient today.  Concerns regarding medicines are outlined above.   Tests Ordered: No orders of the defined types were placed in this encounter.   Medication Changes: No orders of the defined types were placed in this encounter.   Follow Up:  In Person 1 year  Signed, Shelva Majestic, MD  10/06/2019 4:26  PM    [facility]

## 2019-10-06 NOTE — Telephone Encounter (Signed)
Multiple attempts to contact pt from multiple staff members. Left a voicmail to call back to prepare her for VV with dr.kelly today.

## 2019-10-07 ENCOUNTER — Encounter: Payer: Self-pay | Admitting: Anesthesiology

## 2019-10-08 ENCOUNTER — Other Ambulatory Visit: Payer: Self-pay | Admitting: Family Medicine

## 2019-10-08 DIAGNOSIS — G4733 Obstructive sleep apnea (adult) (pediatric): Secondary | ICD-10-CM | POA: Diagnosis not present

## 2019-10-09 ENCOUNTER — Encounter: Payer: Self-pay | Admitting: Family Medicine

## 2019-10-09 ENCOUNTER — Telehealth: Payer: Self-pay | Admitting: Family Medicine

## 2019-10-09 ENCOUNTER — Other Ambulatory Visit: Payer: Self-pay | Admitting: Family Medicine

## 2019-10-09 DIAGNOSIS — F988 Other specified behavioral and emotional disorders with onset usually occurring in childhood and adolescence: Secondary | ICD-10-CM

## 2019-10-09 NOTE — Telephone Encounter (Signed)
Pt needs refill on adderall, she is leaving on Sunday to go out of town for 2 weeks. She thought refills had already been sent in until She called pharmacy for refill yesterday   [facility]

## 2019-10-09 NOTE — Telephone Encounter (Signed)
Called pt per Dr. Johnsie Kindred verbal instructions. Pt was informed that [name] did receive her RX refill request for adderall.pt was informed that this medication will be refilled at some point today and [name] will send pt a mychart message when she sends medication to pharmacy.

## 2019-10-09 NOTE — Telephone Encounter (Signed)
Okay to refill? A note from today starts she is going out of town for 2 weeks \

## 2019-10-09 NOTE — Telephone Encounter (Signed)
Pharmacy called requesting a refill on adderral states pt is going out of town on Sunday please send to the [place], [place]

## 2019-10-09 NOTE — Telephone Encounter (Signed)
Rx sent and patient sent MyChart message notifying her that it was done.

## 2019-10-14 ENCOUNTER — Telehealth: Payer: Self-pay

## 2019-10-14 NOTE — Telephone Encounter (Signed)
Orders for sleep equipment signed on 10/07/2019 and faxed back to [facility]

## 2019-10-19 ENCOUNTER — Ambulatory Visit: Payer: Medicare PPO | Admitting: Cardiovascular Disease

## 2019-10-26 NOTE — Telephone Encounter (Signed)
Called patient and left message that My Chart unread. I read it to her in the voice mail and told her she could view the message in her My Chart.

## 2019-10-29 ENCOUNTER — Encounter: Payer: Self-pay | Admitting: Family Medicine

## 2019-10-30 ENCOUNTER — Other Ambulatory Visit: Payer: Self-pay

## 2019-11-01 ENCOUNTER — Other Ambulatory Visit: Payer: Self-pay | Admitting: Family Medicine

## 2019-11-01 ENCOUNTER — Encounter: Payer: Self-pay | Admitting: Family Medicine

## 2019-11-01 DIAGNOSIS — G4700 Insomnia, unspecified: Secondary | ICD-10-CM

## 2019-11-01 MED ORDER — ESZOPICLONE 3 MG PO TABS: ORAL_TABLET | ORAL | 0 refills | Status: DC

## 2019-11-02 ENCOUNTER — Other Ambulatory Visit: Payer: Self-pay | Admitting: Otolaryngology

## 2019-11-03 NOTE — Progress Notes (Signed)
Start time: 9:45 End time: 10:14  Virtual Visit via Video Note  I connected with Nicole Allen on 11/04/2019 by a video enabled telemedicine application and verified that I am speaking with the correct person using two identifiers.  Location: Patient: home Provider: office   I discussed the limitations of evaluation and management by telemedicine and the availability of in person appointments. The patient expressed understanding and agreed to proceed.  History of Present Illness:  Chief Complaint  Patient presents with  . Anxiety    VIRTUAL med check follow up for anxiety. She said she was supposed to start Boniva was postponing due to dental work. Saw dentist and she is okay to start or at least discuss today.     Patient presents to follow-up on anxiety, as well as discuss DEXA treatment.  At her physical in March she complained of irritability, impatience, anxiety.  She described getting a "seizing"/clutching feeling in her chest/throat, which would sometimes trigger panic and felt frozen.  She would find herself breathing faster/panting.  She was having symptoms every other day, sometimes more than once a day, and had gotten worse the 3 months prior to her visit.  She was started on Lexapro, and today presents for follow-up. She started at 1/2 tablet, increased to full pill after a week. "I'm doing better".  She has had only one of the "episodes" in the last 3 weeks. No longer doing the deep breathing or panting. When she increased to the full tablet she had some nausea, only lasted 1/2 day, and resolved. She currently denies any side effects. Sleep apnea and exhaustion has affected her libido, so unable to really tell if any change related to the lexapro. She is scheduled for nasal surgery next week, and hoping that will help with her energy/sleep/congestion.  Osteopenia: DEXA done in 08/2019 at [facility] (ordered by Dr. Dellis Filbert); T-2.3 at L fem neck, -2.1 R fem neck, -1.9 at spine.  FRAX score was 18% and 4.1% (elevated risk for hip fracture).  We had discussed Boniva at her 08/2019 physical, and she was waiting to discuss with her dentist. She recently saw her dentist, and felt that no surgery was going to be needed, so asking about starting the [place]. We had discussed medication in detail at prior visit. Review of chart shows that Dr. Dellis Filbert, upon seeing the final copy of the DEXA scan (after pt's CPE here, where we reviewed the results together), recommended recheck in 2 years.  We discussed that she didn't specifically recommend treatment, and would like for me to touch base with her.  PMH, PSH, SH reviewed  Outpatient Encounter Medications as of 11/05/2019  Medication Sig Note  . amphetamine-dextroamphetamine (ADDERALL XR) 30 MG 24 hr capsule TAKE (1) CAPSULE DAILY IN THE MORNING.   . cetirizine (ZYRTEC) 10 MG tablet Take 10 mg by mouth daily.   . Coenzyme Q10 (COQ10 PO) Take 1 tablet by mouth daily.   Marland Kitchen conjugated estrogens (PREMARIN) vaginal cream Insert 1 applicator twice weekly vaginally.   Marland Kitchen escitalopram (LEXAPRO) 10 MG tablet Start 1/2 tablet by mouth once daily; increase to full tablet in 1-2 weeks if tolerated (Patient taking differently: Take 10 mg by mouth daily. Taking one daily)   . Eszopiclone 3 MG TABS TAKE ONE TABLET IMMEDIATELY BEFORE BEDTIME.   . fluticasone (FLONASE) 50 MCG/ACT nasal spray USE 2 SPRAYS IN EACH NOSTRIL ONCE A DAY.   . Multiple Vitamin (MULTIVITAMIN) tablet Take 1 tablet by mouth daily.   Marland Kitchen  omeprazole (PRILOSEC) 40 MG capsule TAKE 1 CAPSULE EVERY DAY.   . simvastatin (ZOCOR) 10 MG tablet Take 1 tablet (10 mg total) by mouth at bedtime.   Marland Kitchen SYNTHROID 50 MCG tablet TAKE 1 TABLET ONCE DAILY BEFORE BREAKFAST. 09/24/2019: Takes 139mcg on Sunday, 4mcg all other days  . TURMERIC PO Take by mouth daily.   . mupirocin ointment (BACTROBAN) 2 % Place 1 application into the nose as directed.   . [DISCONTINUED] Eszopiclone 3 MG TABS TAKE ONE TABLET  IMMEDIATELY BEFORE BEDTIME.    No facility-administered encounter medications on file as of 11/05/2019.   Allergies  Allergen Reactions  . Morphine And Related Other (See Comments)    Family history.  . Codeine Itching and Rash    ROS: no fever, chills, URI symptoms, chest pain, shortness of breath. No headaches, dizziness, nausea, vomiting, diarrhea, pain. Anxiety is significantly improved.  +chronic nasal congestion, insomnia.  No other complaints.   Observations/Objective:  Pulse 67   Ht 4' 10.5" (1.486 m)   Wt 111 lb 9.6 oz (50.6 kg)   BMI 22.93 kg/m   Pleasant, well-appearing female in no distress. She is smiling, and in good spirits.  She is alert, oriented, cranial  Nerves are grossly intact. Normal mood, affect, grooming, speech, eye contact. Exam is limited due to virtual nature of the visit.  GAD-7 score of 5 (down from 19).   Assessment and Plan:  Generalized anxiety disorder - significantly improved on Lexapro, continue. Will let us know if libido is an issue in the future - Plan: escitalopram (LEXAPRO) 10 MG tablet  High risk for hip fracture - Treatment had been recommended with bisphosphonate, got okay from dentist.  Wants to get OK from GYN, then will start. Reviewed risks/SE, how to take  Osteopenia of necks of both femurs   RF 90 x 1  Send note to Dr. Dellis Filbert, prefers to have her blessing. Hadn't read message, wants to hear input.   Follow Up Instructions:    I discussed the assessment and treatment plan with the patient. The patient was provided an opportunity to ask questions and all were answered. The patient agreed with the plan and demonstrated an understanding of the instructions.   The patient was advised to call back or seek an in-person evaluation if the symptoms worsen or if the condition fails to improve as anticipated.  I provided 29 minutes of  Video face-to-face time during this encounter. Additional time spent in chart review and  documentation.   Vikki Ports, MD

## 2019-11-05 ENCOUNTER — Encounter (HOSPITAL_BASED_OUTPATIENT_CLINIC_OR_DEPARTMENT_OTHER): Payer: Self-pay | Admitting: Otolaryngology

## 2019-11-05 ENCOUNTER — Other Ambulatory Visit: Payer: Self-pay

## 2019-11-05 ENCOUNTER — Encounter: Payer: Self-pay | Admitting: Family Medicine

## 2019-11-05 ENCOUNTER — Telehealth (INDEPENDENT_AMBULATORY_CARE_PROVIDER_SITE_OTHER): Payer: Medicare PPO | Admitting: Family Medicine

## 2019-11-05 VITALS — HR 67 | Ht 58.5 in | Wt 111.6 lb

## 2019-11-05 DIAGNOSIS — M85852 Other specified disorders of bone density and structure, left thigh: Secondary | ICD-10-CM

## 2019-11-05 DIAGNOSIS — M85851 Other specified disorders of bone density and structure, right thigh: Secondary | ICD-10-CM | POA: Diagnosis not present

## 2019-11-05 DIAGNOSIS — Z9189 Other specified personal risk factors, not elsewhere classified: Secondary | ICD-10-CM | POA: Diagnosis not present

## 2019-11-05 DIAGNOSIS — F411 Generalized anxiety disorder: Secondary | ICD-10-CM | POA: Diagnosis not present

## 2019-11-05 MED ORDER — ESCITALOPRAM OXALATE 10 MG PO TABS: 10.0000 mg | ORAL_TABLET | Freq: Every day | ORAL | 1 refills | Status: DC

## 2019-11-06 ENCOUNTER — Encounter: Payer: Self-pay | Admitting: Family Medicine

## 2019-11-06 DIAGNOSIS — E039 Hypothyroidism, unspecified: Secondary | ICD-10-CM

## 2019-11-06 MED ORDER — IBANDRONATE SODIUM 150 MG PO TABS: ORAL_TABLET | ORAL | 11 refills | Status: DC

## 2019-11-06 NOTE — Patient Instructions (Addendum)
Continue Lexapro. If you have ongoing libido issues that are bothersome, we may need to make some changes.  I'm so glad that you are feeling much better!  I will send a note to Dr. Dellis Filbert letting you know that she would like your input/blessing about starting the Benjamin. Be sure to check your MyChart messages!  Stay well. It was good to "see" you.

## 2019-11-06 NOTE — Addendum Note (Signed)
Addended by: Rita Ohara on: 11/06/2019 06:51 PM   Modules accepted: Orders

## 2019-11-07 ENCOUNTER — Other Ambulatory Visit (HOSPITAL_COMMUNITY): Payer: Medicare PPO

## 2019-11-09 ENCOUNTER — Other Ambulatory Visit (HOSPITAL_COMMUNITY)
Admission: RE | Admit: 2019-11-09 | Discharge: 2019-11-09 | Disposition: A | Discharge status: Home / Self Care | Payer: Medicare PPO | Source: Ambulatory Visit | Attending: Otolaryngology | Admitting: Otolaryngology

## 2019-11-09 DIAGNOSIS — Z20822 Contact with and (suspected) exposure to covid-19: Secondary | ICD-10-CM | POA: Diagnosis not present

## 2019-11-09 DIAGNOSIS — Z01812 Encounter for preprocedural laboratory examination: Secondary | ICD-10-CM | POA: Diagnosis not present

## 2019-11-09 LAB — SARS CORONAVIRUS 2 (TAT 6-24 HRS): SARS Coronavirus 2: NEGATIVE

## 2019-11-10 MED ORDER — SYNTHROID 50 MCG PO TABS: ORAL_TABLET | ORAL | 3 refills | Status: DC

## 2019-11-11 ENCOUNTER — Ambulatory Visit (HOSPITAL_BASED_OUTPATIENT_CLINIC_OR_DEPARTMENT_OTHER)
Admission: RE | Admit: 2019-11-11 | Discharge: 2019-11-11 | Disposition: A | Discharge status: Home / Self Care | Payer: Medicare PPO | Attending: Otolaryngology | Admitting: Otolaryngology

## 2019-11-11 ENCOUNTER — Ambulatory Visit (HOSPITAL_BASED_OUTPATIENT_CLINIC_OR_DEPARTMENT_OTHER): Payer: Medicare PPO | Admitting: Certified Registered Nurse Anesthetist

## 2019-11-11 ENCOUNTER — Encounter (HOSPITAL_BASED_OUTPATIENT_CLINIC_OR_DEPARTMENT_OTHER): Payer: Self-pay | Admitting: Otolaryngology

## 2019-11-11 ENCOUNTER — Other Ambulatory Visit: Payer: Self-pay

## 2019-11-11 ENCOUNTER — Encounter (HOSPITAL_BASED_OUTPATIENT_CLINIC_OR_DEPARTMENT_OTHER)
Admission: RE | Disposition: A | Discharge status: Home / Self Care | Payer: Self-pay | Source: Home / Self Care | Attending: Otolaryngology

## 2019-11-11 DIAGNOSIS — Z9841 Cataract extraction status, right eye: Secondary | ICD-10-CM | POA: Insufficient documentation

## 2019-11-11 DIAGNOSIS — Z8249 Family history of ischemic heart disease and other diseases of the circulatory system: Secondary | ICD-10-CM | POA: Diagnosis not present

## 2019-11-11 DIAGNOSIS — J3489 Other specified disorders of nose and nasal sinuses: Secondary | ICD-10-CM | POA: Insufficient documentation

## 2019-11-11 DIAGNOSIS — Z825 Family history of asthma and other chronic lower respiratory diseases: Secondary | ICD-10-CM | POA: Insufficient documentation

## 2019-11-11 DIAGNOSIS — Z8262 Family history of osteoporosis: Secondary | ICD-10-CM | POA: Insufficient documentation

## 2019-11-11 DIAGNOSIS — Z885 Allergy status to narcotic agent status: Secondary | ICD-10-CM | POA: Insufficient documentation

## 2019-11-11 DIAGNOSIS — Z818 Family history of other mental and behavioral disorders: Secondary | ICD-10-CM | POA: Insufficient documentation

## 2019-11-11 DIAGNOSIS — M47812 Spondylosis without myelopathy or radiculopathy, cervical region: Secondary | ICD-10-CM | POA: Insufficient documentation

## 2019-11-11 DIAGNOSIS — E039 Hypothyroidism, unspecified: Secondary | ICD-10-CM | POA: Diagnosis not present

## 2019-11-11 DIAGNOSIS — Z801 Family history of malignant neoplasm of trachea, bronchus and lung: Secondary | ICD-10-CM | POA: Diagnosis not present

## 2019-11-11 DIAGNOSIS — J342 Deviated nasal septum: Secondary | ICD-10-CM | POA: Insufficient documentation

## 2019-11-11 DIAGNOSIS — G4700 Insomnia, unspecified: Secondary | ICD-10-CM | POA: Diagnosis not present

## 2019-11-11 DIAGNOSIS — Z9842 Cataract extraction status, left eye: Secondary | ICD-10-CM | POA: Diagnosis not present

## 2019-11-11 DIAGNOSIS — Z8261 Family history of arthritis: Secondary | ICD-10-CM | POA: Insufficient documentation

## 2019-11-11 DIAGNOSIS — Z79899 Other long term (current) drug therapy: Secondary | ICD-10-CM | POA: Insufficient documentation

## 2019-11-11 DIAGNOSIS — M1990 Unspecified osteoarthritis, unspecified site: Secondary | ICD-10-CM | POA: Diagnosis not present

## 2019-11-11 DIAGNOSIS — M8580 Other specified disorders of bone density and structure, unspecified site: Secondary | ICD-10-CM | POA: Insufficient documentation

## 2019-11-11 DIAGNOSIS — F988 Other specified behavioral and emotional disorders with onset usually occurring in childhood and adolescence: Secondary | ICD-10-CM | POA: Diagnosis not present

## 2019-11-11 DIAGNOSIS — J343 Hypertrophy of nasal turbinates: Secondary | ICD-10-CM | POA: Insufficient documentation

## 2019-11-11 DIAGNOSIS — E7800 Pure hypercholesterolemia, unspecified: Secondary | ICD-10-CM | POA: Insufficient documentation

## 2019-11-11 DIAGNOSIS — G4730 Sleep apnea, unspecified: Secondary | ICD-10-CM | POA: Diagnosis not present

## 2019-11-11 DIAGNOSIS — Z87442 Personal history of urinary calculi: Secondary | ICD-10-CM | POA: Insufficient documentation

## 2019-11-11 DIAGNOSIS — F419 Anxiety disorder, unspecified: Secondary | ICD-10-CM | POA: Diagnosis not present

## 2019-11-11 DIAGNOSIS — K219 Gastro-esophageal reflux disease without esophagitis: Secondary | ICD-10-CM | POA: Insufficient documentation

## 2019-11-11 DIAGNOSIS — Z8349 Family history of other endocrine, nutritional and metabolic diseases: Secondary | ICD-10-CM | POA: Insufficient documentation

## 2019-11-11 DIAGNOSIS — G4733 Obstructive sleep apnea (adult) (pediatric): Secondary | ICD-10-CM

## 2019-11-11 HISTORY — DX: Anxiety disorder, unspecified: F41.9

## 2019-11-11 HISTORY — DX: Other specified postprocedural states: Z98.890

## 2019-11-11 HISTORY — PX: NASAL SEPTOPLASTY W/ TURBINOPLASTY: SHX2070

## 2019-11-11 HISTORY — DX: Nausea with vomiting, unspecified: R11.2

## 2019-11-11 HISTORY — DX: Sleep apnea, unspecified: G47.30

## 2019-11-11 HISTORY — DX: Gastro-esophageal reflux disease without esophagitis: K21.9

## 2019-11-11 SURGERY — SEPTOPLASTY, NOSE, WITH NASAL TURBINATE REDUCTION
Anesthesia: General | Site: Nose | Laterality: Bilateral

## 2019-11-11 MED ORDER — CEFAZOLIN SODIUM-DEXTROSE 2-3 GM-%(50ML) IV SOLR
INTRAVENOUS | Status: DC | PRN
  Administered 2019-11-11: 2 g via INTRAVENOUS

## 2019-11-11 MED ORDER — ACETAMINOPHEN 10 MG/ML IV SOLN: 1000.0000 mg | Freq: Once | INTRAVENOUS | Status: DC | PRN

## 2019-11-11 MED ORDER — LACTATED RINGERS IV SOLN: INTRAVENOUS | Status: DC

## 2019-11-11 MED ORDER — OXYMETAZOLINE HCL 0.05 % NA SOLN
NASAL | Status: DC | PRN
  Administered 2019-11-11: 1 via TOPICAL

## 2019-11-11 MED ORDER — FENTANYL CITRATE (PF) 100 MCG/2ML IJ SOLN: 25.0000 ug | INTRAMUSCULAR | Status: DC | PRN

## 2019-11-11 MED ORDER — ACETAMINOPHEN 325 MG PO TABS: 325.0000 mg | ORAL_TABLET | Freq: Once | ORAL | Status: DC | PRN

## 2019-11-11 MED ORDER — ROCURONIUM BROMIDE 10 MG/ML (PF) SYRINGE
PREFILLED_SYRINGE | INTRAVENOUS | Status: DC | PRN
  Administered 2019-11-11: 50 mg via INTRAVENOUS

## 2019-11-11 MED ORDER — ROCURONIUM BROMIDE 10 MG/ML (PF) SYRINGE
PREFILLED_SYRINGE | INTRAVENOUS | Status: AC
  Filled 2019-11-11: qty 10

## 2019-11-11 MED ORDER — ACETAMINOPHEN 160 MG/5ML PO SOLN: 325.0000 mg | Freq: Once | ORAL | Status: DC | PRN

## 2019-11-11 MED ORDER — CEFAZOLIN SODIUM-DEXTROSE 2-4 GM/100ML-% IV SOLN
INTRAVENOUS | Status: AC
  Filled 2019-11-11: qty 100

## 2019-11-11 MED ORDER — MIDAZOLAM HCL 2 MG/2ML IJ SOLN
INTRAMUSCULAR | Status: DC | PRN
  Administered 2019-11-11: 2 mg via INTRAVENOUS

## 2019-11-11 MED ORDER — ONDANSETRON HCL 4 MG/2ML IJ SOLN
INTRAMUSCULAR | Status: DC | PRN
  Administered 2019-11-11: 4 mg via INTRAVENOUS

## 2019-11-11 MED ORDER — FENTANYL CITRATE (PF) 100 MCG/2ML IJ SOLN
INTRAMUSCULAR | Status: AC
  Filled 2019-11-11: qty 2

## 2019-11-11 MED ORDER — SCOPOLAMINE 1 MG/3DAYS TD PT72
MEDICATED_PATCH | TRANSDERMAL | Status: AC
  Filled 2019-11-11: qty 1

## 2019-11-11 MED ORDER — MIDAZOLAM HCL 2 MG/2ML IJ SOLN
INTRAMUSCULAR | Status: AC
  Filled 2019-11-11: qty 2

## 2019-11-11 MED ORDER — DEXAMETHASONE SODIUM PHOSPHATE 10 MG/ML IJ SOLN
INTRAMUSCULAR | Status: AC
  Filled 2019-11-11: qty 1

## 2019-11-11 MED ORDER — LIDOCAINE 2% (20 MG/ML) 5 ML SYRINGE
INTRAMUSCULAR | Status: DC | PRN
  Administered 2019-11-11: 60 mg via INTRAVENOUS

## 2019-11-11 MED ORDER — OXYCODONE HCL 5 MG/5ML PO SOLN: 5.0000 mg | Freq: Once | ORAL | Status: DC | PRN

## 2019-11-11 MED ORDER — BACITRACIN ZINC 500 UNIT/GM EX OINT
TOPICAL_OINTMENT | CUTANEOUS | Status: AC
  Filled 2019-11-11: qty 28.35

## 2019-11-11 MED ORDER — MUPIROCIN 2 % EX OINT
TOPICAL_OINTMENT | CUTANEOUS | Status: DC | PRN
  Administered 2019-11-11: 1 via NASAL

## 2019-11-11 MED ORDER — MUPIROCIN 2 % EX OINT
TOPICAL_OINTMENT | CUTANEOUS | Status: AC
  Filled 2019-11-11: qty 22

## 2019-11-11 MED ORDER — LIDOCAINE-EPINEPHRINE 1 %-1:100000 IJ SOLN
INTRAMUSCULAR | Status: DC | PRN
  Administered 2019-11-11: 5 mL via INTRADERMAL

## 2019-11-11 MED ORDER — OXYMETAZOLINE HCL 0.05 % NA SOLN
NASAL | Status: AC
  Filled 2019-11-11: qty 30

## 2019-11-11 MED ORDER — SUGAMMADEX SODIUM 200 MG/2ML IV SOLN
INTRAVENOUS | Status: DC | PRN
  Administered 2019-11-11: 200 mg via INTRAVENOUS

## 2019-11-11 MED ORDER — FENTANYL CITRATE (PF) 100 MCG/2ML IJ SOLN
INTRAMUSCULAR | Status: DC | PRN
  Administered 2019-11-11: 50 ug via INTRAVENOUS

## 2019-11-11 MED ORDER — ORAL CARE MOUTH RINSE: 15.0000 mL | Freq: Once | OROMUCOSAL | Status: DC

## 2019-11-11 MED ORDER — CHLORHEXIDINE GLUCONATE 0.12 % MT SOLN: 15.0000 mL | Freq: Once | OROMUCOSAL | Status: DC

## 2019-11-11 MED ORDER — DEXAMETHASONE SODIUM PHOSPHATE 10 MG/ML IJ SOLN
INTRAMUSCULAR | Status: DC | PRN
  Administered 2019-11-11: 10 mg via INTRAVENOUS

## 2019-11-11 MED ORDER — CEPHALEXIN 500 MG PO CAPS: 500.0000 mg | ORAL_CAPSULE | Freq: Three times a day (TID) | ORAL | 0 refills | Status: AC

## 2019-11-11 MED ORDER — LIDOCAINE-EPINEPHRINE 1 %-1:100000 IJ SOLN
INTRAMUSCULAR | Status: AC
  Filled 2019-11-11: qty 1

## 2019-11-11 MED ORDER — PROPOFOL 500 MG/50ML IV EMUL
INTRAVENOUS | Status: AC
  Filled 2019-11-11: qty 50

## 2019-11-11 MED ORDER — MEPERIDINE HCL 25 MG/ML IJ SOLN: 6.2500 mg | INTRAMUSCULAR | Status: DC | PRN

## 2019-11-11 MED ORDER — LIDOCAINE 2% (20 MG/ML) 5 ML SYRINGE
INTRAMUSCULAR | Status: AC
  Filled 2019-11-11: qty 5

## 2019-11-11 MED ORDER — PROPOFOL 10 MG/ML IV BOLUS
INTRAVENOUS | Status: DC | PRN
  Administered 2019-11-11: 140 mg via INTRAVENOUS

## 2019-11-11 MED ORDER — PHENYLEPHRINE 40 MCG/ML (10ML) SYRINGE FOR IV PUSH (FOR BLOOD PRESSURE SUPPORT)
PREFILLED_SYRINGE | INTRAVENOUS | Status: DC | PRN
  Administered 2019-11-11: 80 ug via INTRAVENOUS

## 2019-11-11 MED ORDER — BUPIVACAINE HCL (PF) 0.25 % IJ SOLN
INTRAMUSCULAR | Status: AC
  Filled 2019-11-11: qty 30

## 2019-11-11 MED ORDER — PROMETHAZINE HCL 25 MG/ML IJ SOLN: 6.2500 mg | INTRAMUSCULAR | Status: DC | PRN

## 2019-11-11 MED ORDER — SCOPOLAMINE 1 MG/3DAYS TD PT72
MEDICATED_PATCH | TRANSDERMAL | Status: DC | PRN
  Administered 2019-11-11: 1 via TRANSDERMAL

## 2019-11-11 MED ORDER — OXYCODONE HCL 5 MG PO TABS: 5.0000 mg | ORAL_TABLET | Freq: Once | ORAL | Status: DC | PRN

## 2019-11-11 MED ORDER — ONDANSETRON HCL 4 MG/2ML IJ SOLN
INTRAMUSCULAR | Status: AC
  Filled 2019-11-11: qty 2

## 2019-11-11 MED ORDER — DIPHENHYDRAMINE HCL 50 MG/ML IJ SOLN
INTRAMUSCULAR | Status: AC
  Filled 2019-11-11: qty 1

## 2019-11-11 SURGICAL SUPPLY — 33 items
ATTRACTOMAT 16X20 MAGNETIC DRP (DRAPES) IMPLANT
BLADE SURG 15 STRL LF DISP TIS (BLADE) IMPLANT
BLADE SURG 15 STRL SS (BLADE)
CANISTER SUCT 1200ML W/VALVE (MISCELLANEOUS) ×2 IMPLANT
COAGULATOR SUCT 8FR VV (MISCELLANEOUS) IMPLANT
COVER WAND RF STERILE (DRAPES) IMPLANT
DECANTER SPIKE VIAL GLASS SM (MISCELLANEOUS) IMPLANT
DRSG NASOPORE 8CM (GAUZE/BANDAGES/DRESSINGS) IMPLANT
DRSG TELFA 3X8 NADH (GAUZE/BANDAGES/DRESSINGS) IMPLANT
ELECT REM PT RETURN 9FT ADLT (ELECTROSURGICAL) ×2
ELECTRODE REM PT RTRN 9FT ADLT (ELECTROSURGICAL) ×1 IMPLANT
GLOVE BIO SURGEON STRL SZ 6.5 (GLOVE) ×2 IMPLANT
GLOVE BIOGEL M 7.0 STRL (GLOVE) ×4 IMPLANT
GLOVE BIOGEL PI IND STRL 6.5 (GLOVE) ×1 IMPLANT
GLOVE BIOGEL PI INDICATOR 6.5 (GLOVE) ×1
GOWN STRL REUS W/ TWL LRG LVL3 (GOWN DISPOSABLE) ×2 IMPLANT
GOWN STRL REUS W/TWL LRG LVL3 (GOWN DISPOSABLE) ×4
IV SET EXT 30 76VOL 4 MALE LL (IV SETS) ×2 IMPLANT
NEEDLE PRECISIONGLIDE 27X1.5 (NEEDLE) ×2 IMPLANT
NS IRRIG 1000ML POUR BTL (IV SOLUTION) ×2 IMPLANT
PACK ENT DAY SURGERY (CUSTOM PROCEDURE TRAY) ×2 IMPLANT
SET BASIN DAY SURGERY F.S. (CUSTOM PROCEDURE TRAY) ×2 IMPLANT
SLEEVE SCD COMPRESS KNEE MED (MISCELLANEOUS) ×2 IMPLANT
SPLINT NASAL AIRWAY SILICONE (MISCELLANEOUS) ×2 IMPLANT
SPONGE GAUZE 2X2 8PLY STRL LF (GAUZE/BANDAGES/DRESSINGS) ×2 IMPLANT
SPONGE NEURO XRAY DETECT 1X3 (DISPOSABLE) ×2 IMPLANT
SPONGE SURGIFOAM ABS GEL 12-7 (HEMOSTASIS) IMPLANT
SUT ETHILON 3 0 PS 1 (SUTURE) ×2 IMPLANT
SUT PLAIN 4 0 ~~LOC~~ 1 (SUTURE) ×2 IMPLANT
TOWEL GREEN STERILE FF (TOWEL DISPOSABLE) ×2 IMPLANT
TUBE SALEM SUMP 12R W/ARV (TUBING) IMPLANT
TUBE SALEM SUMP 16 FR W/ARV (TUBING) ×2 IMPLANT
YANKAUER SUCT BULB TIP NO VENT (SUCTIONS) ×2 IMPLANT

## 2019-11-11 NOTE — H&P (Signed)
Nicole Allen is an 69 y.o. female.   Chief Complaint: Nasal Obstruction  HPI: Hx of Deviated septum and obstruction  Past Medical History:  Diagnosis Date  . ADD (attention deficit disorder)   . Allergy    SEASONAL  . Anxiety   . BCC (basal cell carcinoma of skin)    chest; Dr. Tonia Brooms  . BCC (basal cell carcinoma)    Tonia Brooms  . Cervical spondylosis   . GERD (gastroesophageal reflux disease)   . Hearing loss    high frequency (Dr. Prescott Parma); hearing aids recommended; bilateral  . History of kidney stones    x3  . Hypothyroidism 2015  . Insomnia    worse with menopause  . Interstitial keratitis 2021   under care of ophtho  . Kidney stone    x3, Dr. Reece Agar  . Osteopenia   . PONV (postoperative nausea and vomiting)   . Postmenopausal    on HRT--sees GYN  . Pure hypercholesterolemia   . Sleep apnea    uses CPAP    Past Surgical History:  Procedure Laterality Date  . CATARACT EXTRACTION Bilateral L 10/18/15 R 12/13/15   Dr. Herbert Deaner  . CESAREAN SECTION  [id number]  . COLONOSCOPY  2003, 2013   Dr. Collene Mares  . HYMENECTOMY  1993  . KNEE SURGERY Right 09/29/2014   Dr. Jim Desanctis replacement/reconstruction  . TONSILLECTOMY AND ADENOIDECTOMY    . TUBAL LIGATION  1991    Family History  Problem Relation Age of Onset  . Asthma Mother   . Arthritis Mother   . Hypertension Mother   . Hyperlipidemia Mother   . Osteoporosis Mother   . Hypertension Father   . Heart disease Father        17's  . Hyperlipidemia Father   . ADD / ADHD Daughter   . ADD / ADHD Son   . Sleep apnea Sister   . Alzheimer's disease Maternal Grandmother   . Cancer Maternal Grandfather        lung  . Cancer Paternal Grandfather        lung   Social History:  reports that she has never smoked. She has never used smokeless tobacco. She reports current alcohol use. She reports that she does not use drugs.  Allergies:  Allergies  Allergen Reactions  . Morphine And Related Other (See Comments)   Family history.  . Codeine Itching and Rash    Medications Prior to Admission  Medication Sig Dispense Refill  . amphetamine-dextroamphetamine (ADDERALL XR) 30 MG 24 hr capsule TAKE (1) CAPSULE DAILY IN THE MORNING. 90 capsule 0  . cetirizine (ZYRTEC) 10 MG tablet Take 10 mg by mouth daily.    . Coenzyme Q10 (COQ10 PO) Take 1 tablet by mouth daily.    Marland Kitchen conjugated estrogens (PREMARIN) vaginal cream Insert 1 applicator twice weekly vaginally. 42.5 g 4  . escitalopram (LEXAPRO) 10 MG tablet Take 1 tablet (10 mg total) by mouth daily. Taking one daily 90 tablet 1  . Eszopiclone 3 MG TABS TAKE ONE TABLET IMMEDIATELY BEFORE BEDTIME. 90 tablet 0  . fluticasone (FLONASE) 50 MCG/ACT nasal spray USE 2 SPRAYS IN EACH NOSTRIL ONCE A DAY. 16 g 11  . Multiple Vitamin (MULTIVITAMIN) tablet Take 1 tablet by mouth daily.    . mupirocin ointment (BACTROBAN) 2 % Place 1 application into the nose as directed.    Marland Kitchen omeprazole (PRILOSEC) 40 MG capsule TAKE 1 CAPSULE EVERY DAY. 90 capsule 0  . simvastatin (ZOCOR) 10 MG tablet Take  1 tablet (10 mg total) by mouth at bedtime. 90 tablet 3  . SYNTHROID 50 MCG tablet TAKE 1 TABLET ONCE DAILY BEFORE BREAKFAST MON THRU SAT, 2 TABLETS ON SUNDAYS 102 tablet 3  . TURMERIC PO Take by mouth daily.    Marland Kitchen ibandronate (BONIVA) 150 MG tablet Take 1 tablet by mouth every 30 days--on empty stomach, with a full glass of water. No food/meds or laying down for 30 min, 3 tablet 11    Results for orders placed or performed during the hospital encounter of 11/09/19 (from the past 48 hour(s))  SARS CORONAVIRUS 2 (TAT 6-24 HRS) Nasopharyngeal Nasopharyngeal Swab     Status: None   Collection Time: 11/09/19  2:20 PM   Specimen: Nasopharyngeal Swab  Result Value Ref Range   SARS Coronavirus 2 NEGATIVE NEGATIVE    Comment: (NOTE) SARS-CoV-2 target nucleic acids are NOT DETECTED. The SARS-CoV-2 RNA is generally detectable in upper and lower respiratory specimens during the acute phase of  infection. Negative results do not preclude SARS-CoV-2 infection, do not rule out co-infections with other pathogens, and should not be used as the sole basis for treatment or other patient management decisions. Negative results must be combined with clinical observations, patient history, and epidemiological information. The expected result is Negative. Fact Sheet for Patients: SugarRoll.be Fact Sheet for Healthcare Providers: https://www.woods-mathews.com/ This test is not yet approved or cleared by the [place] FDA and  has been authorized for detection and/or diagnosis of SARS-CoV-2 by FDA under an Emergency Use Authorization (EUA). This EUA will remain  in effect (meaning this test can be used) for the duration of the COVID-19 declaration under Section 56 4(b)(1) of the Act, 21 U.S.C. section 360bbb-3(b)(1), unless the authorization is terminated or revoked sooner. Performed at [facility], [place] [address], [place], [place] [address]    No results found.  Review of Systems  Constitutional: Negative.   HENT: Positive for sinus pressure.   Respiratory: Positive for apnea.   Cardiovascular: Negative.     Blood pressure (!) 119/46, pulse 74, temperature (!) 96.9 F (36.1 C), temperature source Tympanic, resp. rate 18, height 4' 10.5" (1.486 m), weight 49.9 kg, SpO2 100 %. Physical Exam  Constitutional: She appears well-developed and well-nourished.  HENT:  Deviated septum  Musculoskeletal:     Cervical back: Normal range of motion and neck supple.     Assessment/Plan Adm for OP septoplasty and IT reduction  Jerrell Belfast, MD 11/11/2019, 8:37 AM

## 2019-11-11 NOTE — Progress Notes (Signed)
Pt states hx of MRSA to face, has bactroban rx

## 2019-11-11 NOTE — Anesthesia Preprocedure Evaluation (Addendum)
Anesthesia Evaluation  Patient identified by MRN, date of birth, ID band Patient awake    Reviewed: Allergy & Precautions, NPO status , Patient's Chart, lab work & pertinent test results  History of Anesthesia Complications (+) PONV and history of anesthetic complications  Airway Mallampati: I  TM Distance: >3 FB Neck ROM: Full    Dental  (+) Teeth Intact, Dental Advisory Given   Pulmonary sleep apnea and Continuous Positive Airway Pressure Ventilation ,    breath sounds clear to auscultation       Cardiovascular negative cardio ROS   Rhythm:Regular Rate:Normal     Neuro/Psych PSYCHIATRIC DISORDERS Anxiety negative neurological ROS     GI/Hepatic Neg liver ROS, GERD  Medicated,  Endo/Other  Hypothyroidism   Renal/GU      Musculoskeletal  (+) Arthritis ,   Abdominal Normal abdominal exam  (+)   Peds  Hematology negative hematology ROS (+)   Anesthesia Other Findings   Reproductive/Obstetrics                            Anesthesia Physical Anesthesia Plan  ASA: II  Anesthesia Plan: General   Post-op Pain Management:    Induction: Intravenous  PONV Risk Score and Plan: 4 or greater and Ondansetron, Dexamethasone, Midazolam, Scopolamine patch - Pre-op and Treatment may vary due to age or medical condition  Airway Management Planned: Oral ETT  Additional Equipment: None  Intra-op Plan:   Post-operative Plan: Extubation in OR  Informed Consent: I have reviewed the patients History and Physical, chart, labs and discussed the procedure including the risks, benefits and alternatives for the proposed anesthesia with the patient or authorized representative who has indicated his/her understanding and acceptance.       Plan Discussed with: CRNA  Anesthesia Plan Comments:        Anesthesia Quick Evaluation

## 2019-11-11 NOTE — Transfer of Care (Signed)
Immediate Anesthesia Transfer of Care Note  Patient: Nicole Allen  Procedure(s) Performed: NASAL SEPTOPLASTY WITH TURBINATE REDUCTION (Bilateral Nose)  Patient Location: PACU  Anesthesia Type:General  Level of Consciousness: awake, alert  and oriented  Airway & Oxygen Therapy: Patient Spontanous Breathing and Patient connected to face mask oxygen  Post-op Assessment: Report given to RN and Post -op Vital signs reviewed and stable  Post vital signs: Reviewed and stable  Last Vitals:  Vitals Value Taken Time  BP 129/73 11/11/19 0945  Temp    Pulse 85 11/11/19 0949  Resp 13 11/11/19 0949  SpO2 95 % 11/11/19 0949  Vitals shown include unvalidated device data.  Last Pain:  Vitals:   11/11/19 0715  TempSrc: Tympanic  PainSc: 0-No pain      Patients Stated Pain Goal: 4 ([id number])  Complications: No apparent anesthesia complications

## 2019-11-11 NOTE — Anesthesia Procedure Notes (Signed)
Procedure Name: Intubation Date/Time: 11/11/2019 8:56 AM Performed by: Genelle Bal, CRNA Pre-anesthesia Checklist: Patient identified, Emergency Drugs available, Suction available and Patient being monitored Patient Re-evaluated:Patient Re-evaluated prior to induction Oxygen Delivery Method: Circle system utilized Preoxygenation: Pre-oxygenation with 100% oxygen Induction Type: IV induction Ventilation: Mask ventilation without difficulty Laryngoscope Size: Miller and 2 Grade View: Grade II Tube type: Oral Tube size: 7.0 mm Number of attempts: 1 Airway Equipment and Method: Stylet and Oral airway Placement Confirmation: ETT inserted through vocal cords under direct vision,  positive ETCO2 and breath sounds checked- equal and bilateral Secured at: 20 cm Tube secured with: Tape Dental Injury: Teeth and Oropharynx as per pre-operative assessment

## 2019-11-11 NOTE — Anesthesia Postprocedure Evaluation (Signed)
Anesthesia Post Note  Patient: JAXX AUD  Procedure(s) Performed: NASAL SEPTOPLASTY WITH TURBINATE REDUCTION (Bilateral Nose)     Patient location during evaluation: PACU Anesthesia Type: General Level of consciousness: awake and alert Pain management: pain level controlled Vital Signs Assessment: post-procedure vital signs reviewed and stable Respiratory status: spontaneous breathing, nonlabored ventilation, respiratory function stable and patient connected to nasal cannula oxygen Cardiovascular status: blood pressure returned to baseline and stable Postop Assessment: no apparent nausea or vomiting Anesthetic complications: no    Last Vitals:  Vitals:   11/11/19 1020 11/11/19 1100  BP:  (!) 111/48  Pulse: 83 77  Resp: 16 14  Temp:  (!) 36.4 C  SpO2: 98% 97%    Last Pain:  Vitals:   11/11/19 1100  TempSrc:   PainSc: 0-No pain                 Effie Berkshire

## 2019-11-11 NOTE — Discharge Instructions (Signed)

## 2019-11-11 NOTE — Op Note (Signed)
Operative Note: SEPTOPLASTY AND INFERIOR TURBINATE REDUCTION  Patient: Ballantine record number: [id number]  Date:11/11/2019  Pre-operative Indications: 1. Deviated nasal septum with nasal airway obstruction     2.  Bilateral inferior turbinate hypertrophy  Postoperative Indications: Same  Surgical Procedure: 1.  Nasal Septoplasty    2.  Bilateral Inferior Turbinate Reduction  Anesthesia: GET  Surgeon: Delsa Bern, M.D.  Complications: None  EBL: 50 cc  Findings: Severely deviated nasal septum with airway obstruction and bilateral inferior turbinate hypertrophy.   Brief History: The patient is a 69 y.o. female with a history of progressive nasal airway obstruction. The patient has been on medical therapy to reduce nasal mucosal edema including saline nasal spray and topical nasal steroids. Despite appropriate medical therapy the patient continues to have ongoing symptoms. Given the patient's history and findings, the above surgical procedures were recommended, risks and benefits were discussed in detail with the patient may understand and agree with our plan for surgery which is scheduled at [facility] under general anesthesia as an outpatient.  Surgical Procedure: The patient is brought to the operating room on 11/11/2019 and placed in supine position on the operating table. General endotracheal anesthesia was established without difficulty. When the patient was adequately anesthetized, surgical timeout was performed and correct identification of the patient and the surgical procedure. The patient's nose was then injected with 5 cc of 1% lidocaine 1 100,000 dilution epinephrine which was injected in a submucosal fashion. The patient's nose was then packed with Afrin-soaked cottonoid pledgets were left in place for approximately 10 minutes lateral vasoconstriction and hemostasis. .  With the patient prepped draped and prepared for surgery, nasal septoplasty  was begun.  A left anterior hemitransfixion incision was created and a mucoperichondrial flap was elevated from anterior to posterior on the left-hand side. The anterior cartilaginous septum was crossed at the midline and a mucoperichondrial flap was elevated on the patient's right.  Swivel knife was then used to resect the anterior and mid cartilaginous portion of the nasal septum.  Resected cartilage was morcellized and returned to the mucoperichondrial pocket at the occlusion of the surgical procedure.  Dissection was then carried out from anterior to posterior removing deviated bone and cartilage including a large septal spur the overlying mucosa was preserved.  With the septum brought to good midline position, the morselized cartilage was returned to the mucoperichondrial pocket and the soft tissue/mucosal flaps were reapproximated with interrupted 4-0 gut suture on a Keith needle in a horizontal mattressing fashion.  Anterior hemitransfixion incision was closed with the same stitch.  Bilateral Doyle nasal septal splints were then placed after the application of Bactroban ointment and sutured in position with a 3-0 Ethilon suture.  Attention was then turned to the inferior turbinates, bilateral inferior turbinate intramural cautery was performed with cautery setting at 50 W.  2 submucosal passes were made in each inferior turbinate.  After completing cautery, anterior vertical incisions were created and overlying soft tissue was elevated, a small amount of turbinate bone was resected.  The turbinates were then outfractured to create a more patent nasal passageway.  Surgical sponge count was correct. An oral gastric tube was passed and the stomach contents were aspirated. Patient was awakened from anesthetic and transferred from the operating room to the recovery room in stable condition. There were no complications and blood loss was 50cc.   Delsa Bern, M.D. [facility] [place] [facility] 11/11/2019

## 2019-11-12 ENCOUNTER — Encounter: Payer: Self-pay | Admitting: *Deleted

## 2019-11-18 ENCOUNTER — Other Ambulatory Visit: Payer: Self-pay | Admitting: Family Medicine

## 2019-11-18 DIAGNOSIS — K219 Gastro-esophageal reflux disease without esophagitis: Secondary | ICD-10-CM

## 2020-01-06 ENCOUNTER — Encounter: Payer: Self-pay | Admitting: *Deleted

## 2020-02-01 ENCOUNTER — Encounter: Payer: Self-pay | Admitting: Family Medicine

## 2020-02-01 DIAGNOSIS — G4700 Insomnia, unspecified: Secondary | ICD-10-CM

## 2020-02-01 DIAGNOSIS — F988 Other specified behavioral and emotional disorders with onset usually occurring in childhood and adolescence: Secondary | ICD-10-CM

## 2020-02-01 MED ORDER — ESZOPICLONE 3 MG PO TABS: ORAL_TABLET | ORAL | 0 refills | Status: DC

## 2020-02-09 MED ORDER — AMPHETAMINE-DEXTROAMPHET ER 30 MG PO CP24: 30.0000 mg | ORAL_CAPSULE | ORAL | 0 refills | Status: DC

## 2020-02-09 NOTE — Addendum Note (Signed)
Addended byRita Ohara on: 02/09/2020 05:06 PM   Modules accepted: Orders
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
## 2020-02-22 ENCOUNTER — Encounter: Payer: Self-pay | Admitting: Family Medicine

## 2020-02-22 ENCOUNTER — Other Ambulatory Visit: Payer: Self-pay

## 2020-02-22 ENCOUNTER — Ambulatory Visit: Payer: Medicare PPO | Admitting: Family Medicine

## 2020-02-22 VITALS — BP 130/80 | HR 72 | Temp 98.2°F | Ht 58.5 in | Wt 116.6 lb

## 2020-02-22 DIAGNOSIS — H01006 Unspecified blepharitis left eye, unspecified eyelid: Secondary | ICD-10-CM

## 2020-02-22 DIAGNOSIS — R197 Diarrhea, unspecified: Secondary | ICD-10-CM

## 2020-02-22 DIAGNOSIS — F411 Generalized anxiety disorder: Secondary | ICD-10-CM

## 2020-02-22 DIAGNOSIS — K219 Gastro-esophageal reflux disease without esophagitis: Secondary | ICD-10-CM | POA: Diagnosis not present

## 2020-02-22 MED ORDER — OMEPRAZOLE 40 MG PO CPDR: 40.0000 mg | DELAYED_RELEASE_CAPSULE | Freq: Every day | ORAL | 1 refills | Status: DC

## 2020-02-22 MED ORDER — ESCITALOPRAM OXALATE 5 MG PO TABS: ORAL_TABLET | ORAL | 0 refills | Status: DC

## 2020-02-22 MED ORDER — BUSPIRONE HCL 15 MG PO TABS: ORAL_TABLET | ORAL | 1 refills | Status: DC

## 2020-02-22 NOTE — Patient Instructions (Addendum)
I recommend a two week dairy free (lactose-free) diet. I also recommend taking probiotics (ie Align), try for at least a month.  Switch your omeprazole to taking it PRIOR/WITH dinner (rather than at bedtime).  Cut the lexapro in 1/2 for a week, then 1/2 tablet every other day for a week. Start Buspar at 1/3 to a 1/2 of a tablet twice daily (you may want to start at the 1/3, if able to cut it that way, just for a few days, and if no side effects, then increase to 1/2 pill twice daily soner than waiting a full week).  After a week, if not doing well, you can increase the morning dose to a full tablet, keeping the evening dose at 1/2.  If needed, after a week, you can then increase to a full tablet twice daily, and stay there until your follow up. You do not need to keep increasing it---stop wherever you feel like it is working well.    Blepharitis Blepharitis is swelling of the eyelids. Symptoms may include:  Reddish, scaly skin around the scalp and eyebrows.  Burning or itching of the eyelids.  Fluid coming from the eye at night. This causes the eyelashes to stick together in the morning.  Eyelashes that fall out.  Being sensitive to light. Follow these instructions at home: Pay attention to any changes in how you look or feel. Tell your health care provider about any changes. Follow these instructions to help with your condition: Keeping clean   Wash your hands often.  Wash your eyelids with warm water, or wash them with warm water that is mixed with little bit of baby shampoo. Do this 2 or more times per day.  Wash your face and eyebrows at least once a day.  Use a clean towel each time you dry your eyelids. Do not use the towel to clean or dry other areas of your body. Do not share your towel with anyone. General instructions  Avoid wearing makeup until you get better. Do not share makeup with anyone.  Avoid rubbing your eyes.  Put a warm compress on your eyes 2 times per  day for 10 minutes at a time, or as told by your doctor.  If you were given antibiotics in the form of creams or eye drops, use the medicine as told by your doctor. Do not stop using the medicine even if you feel better.  Keep all follow-up visits as told by your doctor. This is important. Contact a doctor if:  Your eyelids feel hot.  You have blisters on your eyelids.  You have a rash on your eyelids.  The swelling does not go away in 2-4 days.  The swelling gets worse. Get help right away if:  You have pain that gets worse.  You have pain that spreads to other parts of your face.  You have redness that gets worse.  You have redness that spreads to other parts of your face.  Your vision changes.  You have pain when you look at lights or things that move.  You have a fever. Summary  Blepharitis is swelling of the eyelids.  Pay attention to any changes in how your eyes look or feel. Tell your doctor about any changes.  Follow home care instructions as told by your doctor. Wash your hands often. Avoid wearing makeup. Do not rub your eyes.  Use warm compresses, creams, or eye drops as told by your doctor.  Let your doctor know if  you have changes in vision, blisters or rash on eyelids, pain that spreads to your face, or warmth on your eyelids. This information is not intended to replace advice given to you by your health care provider. Make sure you discuss any questions you have with your health care provider. Document Revised: 12/16/2017 Document Reviewed: 12/16/2017 Elsevier Patient Education  [place].

## 2020-02-23 DIAGNOSIS — R197 Diarrhea, unspecified: Secondary | ICD-10-CM | POA: Diagnosis not present

## 2020-03-03 LAB — CDIFF NAA+O+P+STOOL CULTURE
E coli, Shiga toxin Assay: NEGATIVE
Toxigenic C. Difficile by PCR: NEGATIVE

## 2020-03-25 ENCOUNTER — Other Ambulatory Visit: Payer: Medicare PPO

## 2020-03-26 DIAGNOSIS — Z20828 Contact with and (suspected) exposure to other viral communicable diseases: Secondary | ICD-10-CM | POA: Diagnosis not present

## 2020-03-27 ENCOUNTER — Encounter: Payer: Self-pay | Admitting: Family Medicine

## 2020-03-28 ENCOUNTER — Encounter: Payer: Medicare PPO | Admitting: Family Medicine

## 2020-04-26 DIAGNOSIS — K219 Gastro-esophageal reflux disease without esophagitis: Secondary | ICD-10-CM | POA: Diagnosis not present

## 2020-04-26 DIAGNOSIS — K5730 Diverticulosis of large intestine without perforation or abscess without bleeding: Secondary | ICD-10-CM | POA: Diagnosis not present

## 2020-04-26 DIAGNOSIS — R140 Abdominal distension (gaseous): Secondary | ICD-10-CM | POA: Diagnosis not present

## 2020-04-26 DIAGNOSIS — R194 Change in bowel habit: Secondary | ICD-10-CM | POA: Diagnosis not present

## 2020-04-26 DIAGNOSIS — R635 Abnormal weight gain: Secondary | ICD-10-CM | POA: Diagnosis not present

## 2020-04-30 ENCOUNTER — Encounter: Payer: Self-pay | Admitting: Family Medicine

## 2020-04-30 DIAGNOSIS — G4700 Insomnia, unspecified: Secondary | ICD-10-CM

## 2020-05-02 MED ORDER — ESZOPICLONE 3 MG PO TABS: ORAL_TABLET | ORAL | 0 refills | Status: DC

## 2020-05-09 ENCOUNTER — Telehealth: Payer: Self-pay | Admitting: *Deleted

## 2020-05-09 NOTE — Telephone Encounter (Signed)
Patient called and left message requesting pelvic ultrasound, I called to discuss and message left to call me back.

## 2020-05-11 ENCOUNTER — Encounter: Payer: Self-pay | Admitting: Family Medicine

## 2020-05-11 DIAGNOSIS — F988 Other specified behavioral and emotional disorders with onset usually occurring in childhood and adolescence: Secondary | ICD-10-CM

## 2020-05-12 MED ORDER — AMPHETAMINE-DEXTROAMPHET ER 30 MG PO CP24: 30.0000 mg | ORAL_CAPSULE | ORAL | 0 refills | Status: DC

## 2020-05-13 ENCOUNTER — Other Ambulatory Visit: Payer: Self-pay

## 2020-05-13 DIAGNOSIS — R140 Abdominal distension (gaseous): Secondary | ICD-10-CM

## 2020-05-13 NOTE — Telephone Encounter (Signed)
Yes for Pelvic US with me.

## 2020-05-13 NOTE — Telephone Encounter (Signed)
Patient said she has been having issues since had nasal surgery in May. On antibiotics and 3 mos later saw her PCP with "gut problems' and had gained 6 lbs. Abdomen "sticking out."  She ran a bunch of tests that werenegative. She then went to see a GI doctor and he found some "bad bacteria" in her gut but other than that nothing else. He suggested dietary revision and he suggested she check with Gynecologist to see about getting an ultrasound becausethere are gyn conditions that can cause bloating. No pelvic pain. Just bloating.   Ok to order/schedule?

## 2020-05-13 NOTE — Telephone Encounter (Signed)
Order placed. I staff messaged Claudia to please call patient and let her know Dr. Tiburcio Pea order and schedule her an appt for u/s.

## 2020-05-17 DIAGNOSIS — G4733 Obstructive sleep apnea (adult) (pediatric): Secondary | ICD-10-CM | POA: Diagnosis not present
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
## 2020-05-23 ENCOUNTER — Encounter: Payer: Self-pay | Admitting: Family Medicine

## 2020-05-23 ENCOUNTER — Other Ambulatory Visit: Payer: Self-pay

## 2020-05-23 ENCOUNTER — Ambulatory Visit: Payer: Medicare PPO | Admitting: Family Medicine

## 2020-05-23 VITALS — BP 118/78 | HR 78 | Temp 98.1°F | Wt 111.8 lb

## 2020-05-23 DIAGNOSIS — F988 Other specified behavioral and emotional disorders with onset usually occurring in childhood and adolescence: Secondary | ICD-10-CM | POA: Diagnosis not present

## 2020-05-23 DIAGNOSIS — E7800 Pure hypercholesterolemia, unspecified: Secondary | ICD-10-CM | POA: Diagnosis not present

## 2020-05-23 DIAGNOSIS — Z23 Encounter for immunization: Secondary | ICD-10-CM

## 2020-05-23 DIAGNOSIS — G4733 Obstructive sleep apnea (adult) (pediatric): Secondary | ICD-10-CM

## 2020-05-23 DIAGNOSIS — E039 Hypothyroidism, unspecified: Secondary | ICD-10-CM

## 2020-05-23 DIAGNOSIS — F411 Generalized anxiety disorder: Secondary | ICD-10-CM | POA: Diagnosis not present

## 2020-05-23 DIAGNOSIS — R197 Diarrhea, unspecified: Secondary | ICD-10-CM

## 2020-05-23 DIAGNOSIS — F320 Major depressive disorder, single episode, mild: Secondary | ICD-10-CM

## 2020-05-23 DIAGNOSIS — G4700 Insomnia, unspecified: Secondary | ICD-10-CM | POA: Diagnosis not present

## 2020-05-23 DIAGNOSIS — K219 Gastro-esophageal reflux disease without esophagitis: Secondary | ICD-10-CM | POA: Diagnosis not present

## 2020-05-23 DIAGNOSIS — R21 Rash and other nonspecific skin eruption: Secondary | ICD-10-CM

## 2020-05-23 MED ORDER — ALPRAZOLAM 0.25 MG PO TABS: 0.2500 mg | ORAL_TABLET | Freq: Two times a day (BID) | ORAL | 0 refills | Status: DC | PRN

## 2020-05-23 MED ORDER — BUPROPION HCL ER (XL) 150 MG PO TB24: ORAL_TABLET | ORAL | 0 refills | Status: DC

## 2020-05-23 NOTE — Patient Instructions (Addendum)
  I highly encourage you get counseling.  We discussed [employer] (since they also deal with substance abuse, maybe they would be more helpful than others for family members).  Also consider Al-anon, for both you and your son's fiance.  Start the wellbutrin once daily in the morning.  After a week you may increase to 2 pills if tolerating and if needed.  Call us when running low for a refill--letting us know if you've been taking two (we will change to 300mg  dose), vs if you prefer to stay on the 150mg .  Use the xanax sparingly, when needed for severe anxiety.  Do not drive or use with alcohol or lunesta.  We discussed adding some lotrimin to the nasal area, and continuing the hydrocortisone when needed for itching (don't use at same time).  Try and get back to the CPAP as soon as is tolerable for you.

## 2020-05-24 LAB — CBC WITH DIFFERENTIAL/PLATELET
Basophils Absolute: 0.1 10*3/uL (ref 0.0–0.2)
Basos: 2 %
EOS (ABSOLUTE): 0.3 10*3/uL (ref 0.0–0.4)
Eos: 6 %
Hematocrit: 37.9 % (ref 34.0–46.6)
Hemoglobin: 13 g/dL (ref 11.1–15.9)
Immature Grans (Abs): 0 10*3/uL (ref 0.0–0.1)
Immature Granulocytes: 0 %
Lymphocytes Absolute: 1.5 10*3/uL (ref 0.7–3.1)
Lymphs: 25 %
MCH: 29.5 pg (ref 26.6–33.0)
MCHC: 34.3 g/dL (ref 31.5–35.7)
MCV: 86 fL (ref 79–97)
Monocytes Absolute: 0.4 10*3/uL (ref 0.1–0.9)
Monocytes: 8 %
Neutrophils Absolute: 3.4 10*3/uL (ref 1.4–7.0)
Neutrophils: 59 %
Platelets: 257 10*3/uL (ref 150–450)
RBC: 4.41 x10E6/uL (ref 3.77–5.28)
RDW: 13.2 % (ref 11.7–15.4)
WBC: 5.7 10*3/uL (ref 3.4–10.8)

## 2020-05-24 LAB — COMPREHENSIVE METABOLIC PANEL
ALT: 13 IU/L (ref 0–32)
AST: 17 IU/L (ref 0–40)
Albumin/Globulin Ratio: 2.3 — ABNORMAL HIGH (ref 1.2–2.2)
Albumin: 4.5 g/dL (ref 3.8–4.8)
Alkaline Phosphatase: 58 IU/L (ref 44–121)
BUN/Creatinine Ratio: 15 (ref 12–28)
BUN: 9 mg/dL (ref 8–27)
Bilirubin Total: <0.2 mg/dL (ref 0.0–1.2)
CO2: 25 mmol/L (ref 20–29)
Calcium: 9.4 mg/dL (ref 8.7–10.3)
Chloride: 103 mmol/L (ref 96–106)
Creatinine, Ser: 0.62 mg/dL (ref 0.57–1.00)
GFR calc Af Amer: 106 mL/min/{1.73_m2} (ref >59)
GFR calc non Af Amer: 92 mL/min/{1.73_m2} (ref >59)
Globulin, Total: 2 g/dL (ref 1.5–4.5)
Glucose: 102 mg/dL — ABNORMAL HIGH (ref 65–99)
Potassium: 4.5 mmol/L (ref 3.5–5.2)
Sodium: 143 mmol/L (ref 134–144)
Total Protein: 6.5 g/dL (ref 6.0–8.5)

## 2020-05-24 LAB — TSH: TSH: 1.35 u[IU]/mL (ref 0.450–4.500)

## 2020-06-02 ENCOUNTER — Ambulatory Visit: Payer: Medicare PPO | Admitting: Obstetrics & Gynecology

## 2020-06-02 ENCOUNTER — Other Ambulatory Visit: Payer: Medicare PPO

## 2020-06-05 ENCOUNTER — Encounter: Payer: Self-pay | Admitting: Family Medicine

## 2020-06-06 ENCOUNTER — Other Ambulatory Visit: Payer: Self-pay | Admitting: *Deleted

## 2020-06-06 MED ORDER — BUPROPION HCL ER (XL) 300 MG PO TB24
300.0000 mg | ORAL_TABLET | Freq: Every day | ORAL | 0 refills | Status: DC
Start: 2020-06-06 — End: 2020-10-13

## 2020-06-15 DIAGNOSIS — J324 Chronic pansinusitis: Secondary | ICD-10-CM | POA: Diagnosis not present

## 2020-06-15 DIAGNOSIS — G4733 Obstructive sleep apnea (adult) (pediatric): Secondary | ICD-10-CM | POA: Diagnosis not present

## 2020-06-15 DIAGNOSIS — J3489 Other specified disorders of nose and nasal sinuses: Secondary | ICD-10-CM | POA: Diagnosis not present

## 2020-06-16 ENCOUNTER — Encounter: Payer: Self-pay | Admitting: Family Medicine

## 2020-06-16 DIAGNOSIS — F988 Other specified behavioral and emotional disorders with onset usually occurring in childhood and adolescence: Secondary | ICD-10-CM

## 2020-06-21 ENCOUNTER — Ambulatory Visit: Payer: Medicare PPO | Admitting: Obstetrics & Gynecology

## 2020-06-21 ENCOUNTER — Other Ambulatory Visit: Payer: Self-pay

## 2020-06-21 ENCOUNTER — Ambulatory Visit (INDEPENDENT_AMBULATORY_CARE_PROVIDER_SITE_OTHER): Payer: Medicare PPO

## 2020-06-21 ENCOUNTER — Encounter: Payer: Self-pay | Admitting: Obstetrics & Gynecology

## 2020-06-21 VITALS — BP 110/78

## 2020-06-21 DIAGNOSIS — R9389 Abnormal findings on diagnostic imaging of other specified body structures: Secondary | ICD-10-CM

## 2020-06-21 DIAGNOSIS — R140 Abdominal distension (gaseous): Secondary | ICD-10-CM | POA: Diagnosis not present

## 2020-06-21 DIAGNOSIS — N854 Malposition of uterus: Secondary | ICD-10-CM | POA: Diagnosis not present

## 2020-06-21 NOTE — Progress Notes (Signed)
    Nicole Allen 04-04-1951 [id number]        69 y.o.  G2P2L2 Married  RP: Abdominal bloating for Pelvic US  HPI: Postmenopause, well on no HRT.  No PMB.  Abdominal bloating followed by Dr Collene Mares.  Will r/o ovarian cyst/mass by Pelvic US.   OB History  Gravida Para Term Preterm AB Living  2 2       2   SAB IAB Ectopic Multiple Live Births               # Outcome Date GA Lbr Len/2nd Weight Sex Delivery Anes PTL Lv  2 Para           1 Para             Past medical history,surgical history, problem list, medications, allergies, family history and social history were all reviewed and documented in the EPIC chart.   Directed ROS with pertinent positives and negatives documented in the history of present illness/assessment and plan.  Exam:  Vitals:   06/21/20 1056  BP: 110/78   General appearance:  Normal  Pelvic US today: T/V images.  Anteverted uterus with atrophic appearance measured at 5.41 x 3.81 x 2.64 cm.  No myometrial mass.  Thickened endometrial lining with echogenic focus and cystic spaces suggestive of an endometrial polyp.  The endometrial lining is measured at 6.07 mm.  Both ovaries are atrophic with a right ovarian residual simple cyst avascular and echo-free measured at 9 mm.  No adnexal mass.  No free fluid in the posterior cul-de-sac.   Assessment/Plan:  69 y.o. G2P2   1. Abdominal bloating Abdominal bloating of probable intestinal origin, will continue to investigate with Dr Collene Mares.  Pelvic US thoroughly reviewed with patient.  Patient reassured that her ovaries are normal with no free fluid in the pelvis.    2. Thickened endometrium Mildly thickened endometrial line c/w an endometrial polyp.  F/U Sonohysterogram to further investigate.  Management of endometrial polyp reviewed. - [place] Sonohysterogram; Future  Princess Bruins MD, 11:27 AM 06/21/2020

## 2020-06-22 ENCOUNTER — Encounter: Payer: Self-pay | Admitting: Obstetrics & Gynecology

## 2020-06-30 ENCOUNTER — Telehealth: Payer: Self-pay | Admitting: Family Medicine

## 2020-06-30 DIAGNOSIS — Z5181 Encounter for therapeutic drug level monitoring: Secondary | ICD-10-CM

## 2020-06-30 DIAGNOSIS — E7800 Pure hypercholesterolemia, unspecified: Secondary | ICD-10-CM

## 2020-06-30 NOTE — Telephone Encounter (Signed)
Chart reviewed.  Had labs 05/2020.  Only needs lipids in April.  Orders entered.

## 2020-06-30 NOTE — Telephone Encounter (Signed)
Pt called and scheduled a MWV for April. She would like to come in a couple of days ahead of time for labs. Please place orders and advise pt if ok. Pt cna be reached at [phone].

## 2020-07-07 DIAGNOSIS — K5730 Diverticulosis of large intestine without perforation or abscess without bleeding: Secondary | ICD-10-CM | POA: Diagnosis not present

## 2020-07-07 DIAGNOSIS — K219 Gastro-esophageal reflux disease without esophagitis: Secondary | ICD-10-CM | POA: Diagnosis not present

## 2020-07-08 MED ORDER — AMPHETAMINE-DEXTROAMPHET ER 30 MG PO CP24: 30.0000 mg | ORAL_CAPSULE | ORAL | 0 refills | Status: DC

## 2020-07-14 ENCOUNTER — Encounter: Payer: Medicare PPO | Admitting: Obstetrics & Gynecology

## 2020-07-28 ENCOUNTER — Encounter: Payer: Self-pay | Admitting: Family Medicine

## 2020-07-28 DIAGNOSIS — G4700 Insomnia, unspecified: Secondary | ICD-10-CM

## 2020-07-28 MED ORDER — ESZOPICLONE 3 MG PO TABS: ORAL_TABLET | ORAL | 0 refills | Status: DC

## 2020-08-04 ENCOUNTER — Other Ambulatory Visit: Payer: Medicare PPO

## 2020-08-04 ENCOUNTER — Other Ambulatory Visit: Payer: Medicare PPO | Admitting: Obstetrics & Gynecology

## 2020-08-16 ENCOUNTER — Ambulatory Visit: Payer: Medicare PPO | Admitting: Obstetrics & Gynecology

## 2020-08-16 ENCOUNTER — Encounter: Payer: Self-pay | Admitting: Obstetrics & Gynecology

## 2020-08-16 ENCOUNTER — Other Ambulatory Visit: Payer: Self-pay

## 2020-08-16 VITALS — BP 130/80 | Ht 58.25 in | Wt 106.0 lb

## 2020-08-16 DIAGNOSIS — Z01419 Encounter for gynecological examination (general) (routine) without abnormal findings: Secondary | ICD-10-CM | POA: Diagnosis not present

## 2020-08-16 DIAGNOSIS — M8589 Other specified disorders of bone density and structure, multiple sites: Secondary | ICD-10-CM

## 2020-08-16 DIAGNOSIS — R9389 Abnormal findings on diagnostic imaging of other specified body structures: Secondary | ICD-10-CM

## 2020-08-16 DIAGNOSIS — Z780 Asymptomatic menopausal state: Secondary | ICD-10-CM

## 2020-08-16 DIAGNOSIS — N952 Postmenopausal atrophic vaginitis: Secondary | ICD-10-CM

## 2020-08-16 MED ORDER — PREMARIN 0.625 MG/GM VA CREA
TOPICAL_CREAM | VAGINAL | 4 refills | Status: DC
Start: 2020-08-16 — End: 2021-08-22

## 2020-08-16 NOTE — Progress Notes (Signed)
Nicole Allen 19-Jan-1951 [id number]   History:    70 y.o. G2P2L2 Married  OF:[id number] presenting for annual gyn exam   IRW:[id number], well on no hormone replacement therapy. No postmenopausal bleeding. No pelvic pain. Sexually active, improved with Premarin cream. Breasts normal. Urine and bowel movements normal. Body mass index 21.96. Sleep disorder, much improved with CPAP. Health labs with family physician.  Past medical history,surgical history, family history and social history were all reviewed and documented in the EPIC chart.  Gynecologic History No LMP recorded (lmp unknown). Patient is postmenopausal.  Obstetric History OB History  Gravida Para Term Preterm AB Living  2 2       2   SAB IAB Ectopic Multiple Live Births               # Outcome Date GA Lbr Len/2nd Weight Sex Delivery Anes PTL Lv  2 Para           1 Para              ROS: A ROS was performed and pertinent positives and negatives are included in the history.  GENERAL: No fevers or chills. HEENT: No change in vision, no earache, sore throat or sinus congestion. NECK: No pain or stiffness. CARDIOVASCULAR: No chest pain or pressure. No palpitations. PULMONARY: No shortness of breath, cough or wheeze. GASTROINTESTINAL: No abdominal pain, nausea, vomiting or diarrhea, melena or bright red blood per rectum. GENITOURINARY: No urinary frequency, urgency, hesitancy or dysuria. MUSCULOSKELETAL: No joint or muscle pain, no back pain, no recent trauma. DERMATOLOGIC: No rash, no itching, no lesions. ENDOCRINE: No polyuria, polydipsia, no heat or cold intolerance. No recent change in weight. HEMATOLOGICAL: No anemia or easy bruising or bleeding. NEUROLOGIC: No headache, seizures, numbness, tingling or weakness. PSYCHIATRIC: No depression, no loss of interest in normal activity or change in sleep pattern.     Exam:   BP 130/80   Ht 4' 10.25" (1.48 m)   Wt 106 lb (48.1 kg)   LMP  (LMP  Unknown)   BMI 21.96 kg/m   Body mass index is 21.96 kg/m.  General appearance : Well developed well nourished female. No acute distress HEENT: Eyes: no retinal hemorrhage or exudates,  Neck supple, trachea midline, no carotid bruits, no thyroidmegaly Lungs: Clear to auscultation, no rhonchi or wheezes, or rib retractions  Heart: Regular rate and rhythm, no murmurs or gallops Breast:Examined in sitting and supine position were symmetrical in appearance, no palpable masses or tenderness,  no skin retraction, no nipple inversion, no nipple discharge, no skin discoloration, no axillary or supraclavicular lymphadenopathy Abdomen: no palpable masses or tenderness, no rebound or guarding Extremities: no edema or skin discoloration or tenderness  Pelvic: Vulva: Normal             Vagina: No gross lesions or discharge  Cervix: No gross lesions or discharge  Uterus  AV, normal size, shape and consistency, non-tender and mobile  Adnexa  Without masses or tenderness  Anus: Normal   Assessment/Plan:  70 y.o. female for annual exam   1. Well female exam with routine gynecological exam Normal gynecologic exam and menopause.  No indication for Pap test at this time.  Breast exam normal.  Screening mammogram March 2021 was negative.  Colonoscopy in 2013.  Health labs with family physician.  Good body mass index at 21.96.  Continue with fitness and healthy nutrition.  2. Postmenopausal Well on no systemic hormone replacement therapy.  No postmenopausal bleeding.  3. Postmenopausal atrophic vaginitis Well on Premarin cream.  No contraindication.  Prescription sent to pharmacy.  4. Osteopenia of multiple sites Osteopenia on bone density April 2021.  Continue with vitamin D supplements, calcium intake of 1.5 g total and daily weightbearing physical activities.  5. Thickened endometrium Sonohysterogram scheduled in 3 weeks.  Other orders - conjugated estrogens (PREMARIN) vaginal cream; Insert 1  applicator twice weekly vaginally.  Princess Bruins MD, 2:44 PM 08/16/2020

## 2020-08-17 ENCOUNTER — Other Ambulatory Visit: Payer: Self-pay | Admitting: *Deleted

## 2020-08-17 DIAGNOSIS — K219 Gastro-esophageal reflux disease without esophagitis: Secondary | ICD-10-CM

## 2020-08-17 MED ORDER — OMEPRAZOLE 40 MG PO CPDR: 40.0000 mg | DELAYED_RELEASE_CAPSULE | Freq: Every day | ORAL | 0 refills | Status: DC

## 2020-08-23 ENCOUNTER — Encounter: Payer: Self-pay | Admitting: Obstetrics & Gynecology

## 2020-09-22 ENCOUNTER — Ambulatory Visit (INDEPENDENT_AMBULATORY_CARE_PROVIDER_SITE_OTHER): Payer: Medicare PPO | Admitting: Obstetrics & Gynecology

## 2020-09-22 ENCOUNTER — Other Ambulatory Visit: Payer: Self-pay

## 2020-09-22 ENCOUNTER — Telehealth: Payer: Self-pay

## 2020-09-22 ENCOUNTER — Ambulatory Visit (INDEPENDENT_AMBULATORY_CARE_PROVIDER_SITE_OTHER): Payer: Medicare PPO

## 2020-09-22 DIAGNOSIS — R9389 Abnormal findings on diagnostic imaging of other specified body structures: Secondary | ICD-10-CM | POA: Diagnosis not present

## 2020-09-22 DIAGNOSIS — N840 Polyp of corpus uteri: Secondary | ICD-10-CM

## 2020-09-22 NOTE — Telephone Encounter (Signed)
Nicole Bruins, MD  Ramond Craver, RMA Endometrial Polyp  ICD10--N84.0  [facility] Myosure excision/D+C   CPT [phone]  [facility]   1 hour   F/U preop with me (will have her schedule when I talk with her about scheduling surgery/ka)

## 2020-09-22 NOTE — Progress Notes (Signed)
    Nicole Allen 1950-10-31 [id number]        70 y.o.  G2P2   RP: Thickened endometrium for Sonohysterogram  HPI: Postmenopause, well on no hormone replacement therapy. No postmenopausal bleeding. No pelvic pain.  Pelvic US 06/21/2020 done for abdominal bloating showed a thickened endometrial line at 6.07 mm with cystic spaces c/w a polyp.   OB History  Gravida Para Term Preterm AB Living  2 2       2   SAB IAB Ectopic Multiple Live Births               # Outcome Date GA Lbr Len/2nd Weight Sex Delivery Anes PTL Lv  2 Para           1 Para             Past medical history,surgical history, problem list, medications, allergies, family history and social history were all reviewed and documented in the EPIC chart.   Directed ROS with pertinent positives and negatives documented in the history of present illness/assessment and plan.  Exam:  There were no vitals filed for this visit. General appearance:  Normal                                                                    Sono Infusion Hysterogram ( procedure note)   The initial transvaginal ultrasound demonstrated the following: T/V images.  Anteverted uterus atrophic with no myometrial mass measured at 4.56 x 4.14 x 3.27 cm.  The endometrial lining is thickened at 8.48 mm with a 9 x 10 mm echogenic focus.  Both ovaries are visualized with the right ovary showing a thin-walled cyst which is echo-free and avascular measured at 1.1 cm.  No adnexal mass.  No free fluid in the posterior cul-de-sac.  The speculum  was inserted and the cervix cleansed with Betadine solution after confirming that patient has no allergies.A small sonohysterography catheterwas utilized.  Insertion was facilitated with ring forceps, using a spear-like motion the catheter was inserted to the fundus of the uterus. The speculum is then removed carefully to avoid dislodging the catheter. The catheter was flushed with sterile saline delete prior to  insertion to rid it of small amounts of air.the sterile saline solution was infused into the uterine cavity as a vaginal ultrasound probe was then placed in the vagina for full visualization of the uterine cavity from a transvaginal approach. The following was noted: After introducing 0.9% normal saline into the endometrial cavity, a filling defect is noted measured at 1 cm.  The catheter was then removed after retrieving some of the saline from the intrauterine cavity. An endometrial biopsy was not done. Patient tolerated procedure well. She had received a tablet of Aleeve for discomfort.    Assessment/Plan:  70 y.o. G2P2   1. Thickened endometrium Pelvic ultrasound findings reviewed with patient.  2. Endometrial polyp The sonohysterogram reveals a probable endometrial polyp measured at 1 cm.  Decision to proceed with hysteroscopy/excision of polyp with MyoSure and D&C.  Information about procedure given to patient.  Pamphlet given.  Will follow up for preop visit.  Princess Bruins MD, 12:32 PM 09/22/2020

## 2020-09-26 ENCOUNTER — Telehealth: Payer: Self-pay | Admitting: Cardiovascular Disease

## 2020-09-26 NOTE — Telephone Encounter (Signed)
Pt called and stated she is having a issue with her CPAP.  It is giving her a rash on her nose.  She is not able to use the CPAP due to the rash and since she is not using the CPAP she is not sleeping   Best number [phone]

## 2020-09-28 ENCOUNTER — Encounter: Payer: Self-pay | Admitting: Obstetrics & Gynecology

## 2020-09-28 NOTE — Telephone Encounter (Signed)
Call to patient. Per DPR, OK to leave message on voicemail.   Left voicemail requesting a return call to [facility] to review benefits for recommended surgery with Marie-Lyne Lavoie, MD, FACOG, FRCSC, MA. 

## 2020-10-01 ENCOUNTER — Other Ambulatory Visit: Payer: Self-pay | Admitting: Family Medicine

## 2020-10-01 DIAGNOSIS — E7800 Pure hypercholesterolemia, unspecified: Secondary | ICD-10-CM

## 2020-10-04 NOTE — Telephone Encounter (Signed)
Try to work into my schedule in the office next week

## 2020-10-05 NOTE — Telephone Encounter (Signed)
Call to patient. Per DPR, OK to leave message on voicemail.   Left voicemail requesting a return call to [employer] to review benefits for recommended surgery with Marie-Lyne Lavoie, MD, FACOG, FRCSC, MA. 

## 2020-10-07 NOTE — Telephone Encounter (Signed)
Called and asked Anderson Malta with [employer] to contact patient for assistance.

## 2020-10-12 NOTE — Progress Notes (Signed)
Chief Complaint  Patient presents with  . Medicare Wellness    Fasting(blood in lab) AWV/CPE, no pap. Sees Dr. Marica Otter and has appt end of April. Has a polyp in her uterus that Dr.Lavoie wants to remove. (hysteroscopy) wants to discuss with you. Also hands have been swelling over the last month. Has not scheduled mammo, thought it was every other year-will schedule. PHQ9(8).    Nicole Allen is a 70 y.o. female who presents for annual physical exam, Medicare wellness visit, follow-up on chronic medical conditions, with a list of concerns, questions and frustrations.  Hands started swelling--she first noticed this in her left hand after she tripped and landed on it, but then also noted on the other hand.  Not terrible, but had to remove her rings, unable to wear, due to swelling. She denies any swelling of face, lower extremities. Denies change in sodium intake.  OSA:  Under the care of Dr. Claiborne Billings.  She has tried getting an appointment to address rash around her nostrils, making it painful to use her CPAP, she stopped using it. (offered her appt 4/15, she didn't read message until today, reports she never got any notification that there was a message). She hasn't used CPAP in at least 2 months. She states the rash gets a little better, but then it comes back, hasn't resolved entirely.  It is sore, itches, and sometimes gets blisters.   She was seen for similar issue in 05/2020. At that time she used bactroban, but felt it made it more inflamed ("red and stinging").  She then used hydrocortisone, as it has been itchy, which decreased the swelling, and then the area just looked pink.  She is embarrassed by how it looks.  She stopped using her Flonase and CPAP related to this rash (wasn't sure if the vaseline she needs to use to make the mask/nasal pillows fit correctly was contributing/worsening things).  She states she asked ENT about rash, she still had it at the time, didn't have any answers.   She stopped using everything for a while, it finally resolved.  She got a new machine in late Fall, probably x 3 months before the rash recurred.    She continues to have insomnia, needs Lunesta every night, and is effective 95% of the time or more.  If she doesn't take Lunesta (if she can't remember if she already took it), she doesn't sleep at all. Last filled #90 on 07/28/20. She is worried about the dosage, read that she should only be on 55m, and of potential risks of 350mdose.  She states she is now having some grogginess in the morning, memory issues--which she reports started prior to stopping the OSA. LuJohnnye Simaad initially been prescribed by Dr. KeClaiborne Billingsshe would have liked to address this (since he treats her sleep) with him, but she is having trouble getting an appointment.   Anxiety: Lexapro was started 08/2019.  She stopped this in August related to sexual side effects (anorgasmia). Also complaining about weight gain, all in her stomach, clothes not fitting (had gained 5#).  In August she was changed to Buspar. She reports she took Buspar for only 3 weeks, didn't like how she felt.  Wasn't sure that it helped with her anxiety. In 05/2020, depression was also a factor, in addition to anxiety.  At that time, GAD-7 score was 17, PHQ-9 score was 7. She was started on Wellbutrin. She stopped taking it in December.  She reported feeling dizzy and  confused, hands were trembling, sometimes so badly that she had difficulty typing.  She was given names of psychiatrists for further evaluation/treatment of her anxiety/depression, given the many side effects she has had with medications. She never looked into this. There are significant family stressors, and counseling has also been recommended. She has been too overwhelmed with so many of her other medical issues that she hasn't had time to try and find someone that would be a good fit.  Hypothyroidism: She previously saw Dr. Chalmers Cater, now managed here.  She  continues on the same dose, continues to double up her dose on Sundays.  No changes to skin/bowels.  No recent change in weight, bowels are normal.  Decreased energy related to poor sleep and sleep apnea. Some slight hair loss. Lab Results  Component Value Date   TSH 1.350 05/23/2020    ADD:She continues to take58m of Adderall.  Noted at prior visit that she had been struggling more with her attention and concentration.   Increased anxiety, stress, and trouble sleeping (when not using CPAP related to nasal rash) may have been factors.  She reported being more irritable, intolerant.  Dose has been kept the same due to contributing factors. Last filled 07/08/20 for #90. Going OOT, would like refill.  Hyperlipidemia: She was tried off simvastatin in the past(she was only on very low dose, tried without--still high, so then we tried treating mild hypothyroidism, but lipids didn't change). Went back on 11mof simvastatin (given family h/o CAD). Tolerating it fine without any side effects. She saw Dr. PaDorris Carnesn [id number], who sent her for Ca score CT, which showed Ca score of 0, no evidence of calcified plaque. Review of her recommendations stated that she could cut back on simvastatin to 5mg42mollow lipids, could stop simvastatin. She did not cut back or stop, continues on 10mg38me continues to follow a low cholesterol diet. Doesn't eat red meat, has cheese, avoids mayonnaise, eats eggs once a week. She is upset she is on too many medications and wants to revisit stopping this medication. She is due for recheck of lipids. Lab Results  Component Value Date   CHOL 202 (H) 09/22/2019   HDL 70 09/22/2019   LDLCALC 113 (H) 09/22/2019   TRIG 107 09/22/2019   CHOLHDL 2.9 09/22/2019   Allergies:  Using flonase and zyrtec every night. Allergies are controlled  GERD: She continues to take omeprazole 40mg 81mre dinner, and pepcid in the morning, per Dr. Mann (Collene Maresr having  flares of GERD not adequately controlled with omeprazole alone). This has been effective. Denies dysphagia.  She had been complaining of a lot of bloating in her stomach.  Pelvic US 12/[place]/2021 done for abdominal bloating showed a thickened endometrial line at 6.07 mm with cystic spaces c/w a polyp.  She had f/u with Dr. LavoieDellis Filbertmonth--sonohysterogram revealed a probable endometrial polyp measured at 1 cm.  Decision to proceed with hysteroscopy/excision of polyp with MyoSure and D&C.   She reports that the sonohysterogram was very painful, and she is hesitant to schedule. She didn't realize procedure was invasive, was very unprepared for it thought just an ultrasound.  She bled for 5 days afterwards.  She has lost confidence, and would like to see another GYN.  Osteopenia:After her last DEXA in January, 2018, her  priorGYN wantedher to start on alendronate--she reported being told it wasn't bad enough for insurance to cover it., She doesn't take calcium (worries about her heart), just a MVI  daily.  She doesn't drink milk, but she eats cheese. Last DEXA 08/2019-- T-2.1 at R femoral neck, -2.3 at L femoral neck, T-1.9 at spine, and -2.1 L total femur. FRAX score was 18% and 4.1% for hip fracture. Boniva was started in 12/2019 (rx'd by GYN) Denies side effects.  Immunization History  Administered Date(s) Administered  . Fluad Quad(high Dose 65+) 04/13/2019, 05/23/2020  . Hep A / Hep B 05/08/2018  . Hepatitis A 06/12/2018  . Hepatitis B 06/12/2018  . Influenza Split 04/23/2011  . Influenza, High Dose Seasonal PF 07/11/2016, 04/10/2017, 03/27/2018  . Influenza, Seasonal, Injecte, Preservative Fre 06/09/2012  . Influenza,inj,Quad PF,6+ Mos 04/22/2014, 04/14/2015  . Moderna Sars-Covid-2 Vaccination 08/05/2019, 09/02/2019, 05/04/2020  . Pneumococcal Conjugate-13 07/11/2016  . Pneumococcal Polysaccharide-23 09/16/2017  . Tdap 12/26/2005  . Typhoid Inactivated 05/08/2018  . Zoster 05/14/2011  .  Zoster Recombinat (Shingrix) 05/08/2018, 07/10/2018   She got vaccines through health dept (travel clinic) for trip to [place] (that was cancelled). [facility] reviewed--no tetanus documented as given then. Others have been abstracted into chart and noted above. She truly thinks she got a tetanus shot then.  Last Pap smear: 07/2018, normal.  Last mammogram:08/2019 Last colonoscopy: 12/2011, small polyp. She called and was told that her next colonoscopy was due 2023, not 5 years (that criteria changed) Last DEXA: 08/2019  T-2.1 R fem neck, -2.3 L fem neck, -1.9 spine, -2.1 L total femur. FRAX 18% and 4.1% Ophtho: yearly  Dentist: every 6 months  Exercise: Sporadic bike riding and pickleball. Nothing regular.  Vitamin D was36 in 09/2016  Other doctors caring for patient include: GYN: Dr. Dellis Filbert Ophtho: Dr. Herbert Deaner (and Dr. Marica Otter as Optometrist) GI: Dr. Collene Allen Dermatologist: Dr. Delman Cheadle Sleep doctor: Dr. Claiborne Billings Ortho: Dr. Noemi Chapel Cardiology: previously saw Dr. Harrington Challenger, more recently Dr. Claiborne Billings (for sleep eval) Audiology: [facility] ENT: Dr. Wilburn Cornelia  Depression screen:abnormal, see below Fall screen: one--tripped up 2 steps, fell onto her hand, no injury Functional Status screen signficant only for known chronic hearing loss (has hearing aids), known ADHD. Mini-cog screening normal score of 5  End of Life Discussion: Patient hasa living will and medical power of attorney. She has been asked to get copies to [place] (not yet received)   PMH, PSH ,SH and FH were reviewed and updated.  Outpatient Encounter Medications as of 10/13/2020  Medication Sig  . amphetamine-dextroamphetamine (ADDERALL XR) 30 MG 24 hr capsule Take 1 capsule (30 mg total) by mouth every morning.  . cetirizine (ZYRTEC) 10 MG tablet Take 10 mg by mouth daily.  . Coenzyme Q10 (COQ10 PO) Take 1 tablet by mouth daily.  Marland Kitchen conjugated estrogens (PREMARIN) vaginal cream Insert 1 applicator twice weekly vaginally.  .  Eszopiclone 3 MG TABS TAKE ONE TABLET IMMEDIATELY BEFORE BEDTIME.  . famotidine (PEPCID) 40 MG tablet Take 40 mg by mouth daily.  Marland Kitchen ibandronate (BONIVA) 150 MG tablet Take 1 tablet by mouth every 30 days--on empty stomach, with a full glass of water. No food/meds or laying down for 30 min,  . Multiple Vitamin (MULTIVITAMIN) tablet Take 1 tablet by mouth daily.  . mupirocin ointment (BACTROBAN) 2 % Place 1 application into the nose as directed.   Marland Kitchen omeprazole (PRILOSEC) 40 MG capsule Take 1 capsule (40 mg total) by mouth daily. Take before dinner  . simvastatin (ZOCOR) 10 MG tablet TAKE ONE TABLET AT BEDTIME.  . SYNTHROID 50 MCG tablet TAKE 1 TABLET ONCE DAILY BEFORE BREAKFAST MON THRU SAT, 2 TABLETS ON  SUNDAYS  . TURMERIC PO Take by mouth daily.  . [DISCONTINUED] buPROPion (WELLBUTRIN XL) 300 MG 24 hr tablet Take 1 tablet (300 mg total) by mouth daily.  Marland Kitchen ALPRAZolam (XANAX) 0.25 MG tablet Take 1-2 tablets (0.25-0.5 mg total) by mouth 2 (two) times daily as needed for anxiety. (Patient not taking: Reported on 10/13/2020)  . [DISCONTINUED] busPIRone (BUSPAR) 15 MG tablet Take as directed (start at 1/2 tablet twice daily, and increase as directed up to full tablet twice daily if needed)  . [DISCONTINUED] escitalopram (LEXAPRO) 5 MG tablet Use to taper off--1 tablet daily for a week, then 1/2 tablet for a week, then every other day for a week then stop   No facility-administered encounter medications on file as of 10/13/2020.   Allergies  Allergen Reactions  . Morphine And Related Other (See Comments)    Family history.  . Codeine Itching and Rash    ROS: The patient denies fever, weight changes, headaches, vision changes, ear pain, sore throat, breast concerns, chest pain, palpitations, dizziness, syncope, dyspnea on exertion, cough, swelling, nausea, vomiting, diarrhea, constipation, abdominal pain, melena, hematochezia, hematuria, incontinence, dysuria, vaginal bleeding, discharge, odor or itch,  genital lesions, numbness, tingling, weakness, tremor, suspicious skin lesions,abnormal bleeding/bruising, or enlarged lymph nodes.  Depression, +anxiety per HPI. Insomnia is controlled with current regimen, though she wants to decrease dose Hearing loss--uses hearing aids Bilateral knee pain, previously has gotten injections in the knees prior to vacations (strenuous hiking/climbing) from Dr. Noemi Chapel.  Has mild daily knee pain. Reflux controlled, per HPI Anxiety/depression per HPI Rash on nose Hand swelling   PHYSICAL EXAM:  BP 126/70   Pulse 76   Ht 4' 10.5" (1.486 m)   Wt 107 lb (48.5 kg)   LMP  (LMP Unknown)   BMI 21.98 kg/m   Wt Readings from Last 3 Encounters:  10/13/20 107 lb (48.5 kg)  08/16/20 106 lb (48.1 kg)  05/23/20 111 lb 12.8 oz (50.7 kg)    General Appearance:  Alert, cooperative, pleasant patient in no distress; appears stated age. She declined to change into gown for today's visit. She is somewhat irritable today, overwhelmed with how much medication she takes and how many things are going on/bothering her  Head:  Normocephalic, without obvious abnormality, atraumatic   Eyes:  PERRL, conjunctiva/corneas clear, EOM's intact, fundi benign   Ears:  Normal TM's and external ear canals   Nose:  Not examined, wearing mask due to COVID-19 pandemic  Throat:  Not examined, wearing mask due to COVID-19 pandemic  Neck:  Supple, no lymphadenopathy; thyroid: no enlargement/tenderness/nodules; no carotid  bruit or JVD   Back:  Spine nontender, no curvature, ROM normal, no CVA tenderness   Lungs:  Clear to auscultation bilaterally without wheezes, rales or ronchi; respirations unlabored   Chest Wall:  No tenderness or deformity   Heart:  Regular rate and rhythm, S1 and S2 normal, no murmur, rub or gallop   Breast Exam:  Deferred to GYN   Abdomen:  Soft, non-tender, nondistended, normoactive bowel sounds, no masses,  no  hepatosplenomegaly   Genitalia:  Deferred to GYN      Extremities:  No clubbing, cyanosis or edema. No effusion or warmth at knees  Pulses:  2+ and symmetric all extremities   Skin:  Skin color, texture, turgor normal. Exam is limited, not undressed.There is mild erythema at base of nares, at rim. There is a small dot/papule below left side of nose. No vesicles   Lymph nodes:  Cervical and supraclavicular, inguinal nodes normal   Neurologic:  Normal strength, sensation and gait; reflexes 2+ and symmetric throughout                 Psych:   Irritable/anxious, full range of affect. Normal hygiene, grooming, eye contact and speech.  PHQ-9 score of 8 GAD-7 score of  20   ASSESSMENT/PLAN:  Annual physical exam  Medicare annual wellness visit, subsequent  Pure hypercholesterolemia - well controlled on 10m simva (recheck due today); pt asking for trial off. Had negative CT Ca score, is reasonable to re-try off - Plan: Lipid panel  Insomnia, unspecified type - pt requesting trial of 122mlunesta (though has been doing well on 25m69m  Can increase to 2 tablets if needed. (and can change to 2mg28mb if needed) - Plan: eszopiclone (LUNESTA) 1 MG TABS tablet  Attention deficit disorder, unspecified hyperactivity presence - Unsure if current medicaton is adequate, many other contributing factors (anxiety, untreated OSA). Rec she address this with psych for changes. Cont for now - Plan: amphetamine-dextroamphetamine (ADDERALL XR) 30 MG 24 hr capsule  Generalized anxiety disorder - Uncontrolled. Pt intolerant of many meds tried, rec psych eval and counseling.  She will call, and will let us [place] if [facility] referral is needed.   Hypothyroidism, unspecified type - adequately replaced per last check, no new symptoms. cont curent dose  Gastroesophageal reflux disease, unspecified whether esophagitis present - controlled on current regimen of famotidine and omeprazole  OSA  (obstructive sleep apnea) - noncompliant with use of CPAP due to rash at nose  Osteopenia of necks of both femurs - cont monthly Boniva. Discussed Ca, D and weight-bearing exercise. DEXA due again next year  Medication monitoring encounter - Plan: Lipid panel  Rash of face - not related to CPAP, since still occuring. ?if related to Flonase. Treat with 2.5% HC ointment BID prn. Restart CPAP - Plan: hydrocortisone 2.5 % ointment  Endometrial polyp - pt seeking second opinion/transfer regarding treatment and procedure - Plan: Ambulatory referral to Gynecology  Swelling of both hands - minimal, don't suspect concerning etiology--Ddx discussed (Na intake, heat), reassured   2.5% HC ointment twice daily for nose Lunesta--trial of 1mg,525mn take 2. If not effective, can go back to 25mg. 60m will discuss further with Dr. Kelly.Claiborne Billingsf/u with psych ( Dr. CottleClovis PuKaur sToy Carested previously, or [facility]). Therapist needed--she will research herself, will let us kno[place]if needs assistance through [place] should be able to help adjust meds (even streamline?? Since her concern is too many meds), and treat anxiety, depression, ADHD, and poss insomnia as well.  Trial off statin, assuming that today's level is similar to that of last year--recheck in 3-6 months.  6 mos with lab prior--TSH, lipids, c-met, CBC   Discussed monthly self breast exams and yearly mammograms (reminded she is due); at least 30 minutes of aerobic activity at least 5 days/week, weight-bearing exercise 2x/wk; proper sunscreen use reviewed; healthy diet, including goals of calcium and vitamin D intake and alcohol recommendations (less than or equal to 1 drink/day) reviewed; regular seatbelt use; changing batteries in smoke detectors. Immunization recommendations discussed--no record of tetanus given from [facility].  She believes it was given, possibly missed in putting in [facility], [place] got other travel vaccines  (2019).Continue yearly high dose flu shots. She got her 2nd COVID booster this morning. Colonoscopy recommendations reviewed--UTD  Full Code, Full Care. Reminded patient to get us cop[place] of her  living will and healthcare power of attorney.  I spent 80 minutes dedicated to the care of this patient, including pre-visit review of records, face to face time, post-visit ordering of testing and documentation. As documented above, much of this was spent on issues separate from wellness, addressing her many concerns, complaints, chronic issues.   Medicare Attestation I have personally reviewed: The patient's medical and social history Their use of alcohol, tobacco or illicit drugs Their current medications and supplements The patient's functional ability including ADLs,fall risks, home safety risks, cognitive, and hearing and visual impairment Diet and physical activities Evidence for depression or mood disorders  The patient's weight, height, BMI have been recorded in the chart.  I have made referrals, counseling, and provided education to the patient based on review of the above and I have provided the patient with a written personalized care plan for preventive services.

## 2020-10-12 NOTE — Patient Instructions (Addendum)
HEALTH MAINTENANCE RECOMMENDATIONS:  It is recommended that you get at least 30 minutes of aerobic exercise at least 5 days/week (for weight loss, you may need as much as 60-90 minutes). This can be any activity that gets your heart rate up. This can be divided in 10-15 minute intervals if needed, but try and build up your endurance at least once a week.  Weight bearing exercise is also recommended twice weekly.  Eat a healthy diet with lots of vegetables, fruits and fiber.  "Colorful" foods have a lot of vitamins (ie green vegetables, tomatoes, red peppers, etc).  Limit sweet tea, regular sodas and alcoholic beverages, all of which has a lot of calories and sugar.  Up to 1 alcoholic drink daily may be beneficial for women (unless trying to lose weight, watch sugars).  Drink a lot of water.  Calcium recommendations are 1200-1500 mg daily (1500 mg for postmenopausal women or women without ovaries), and vitamin D 1000 IU daily.  This should be obtained from diet and/or supplements (vitamins), and calcium should not be taken all at once, but in divided doses.  Monthly self breast exams and yearly mammograms for women over the age of 33 is recommended.  Sunscreen of at least SPF 30 should be used on all sun-exposed parts of the skin when outside between the hours of 10 am and 4 pm (not just when at beach or pool, but even with exercise, golf, tennis, and yard work!)  Use a sunscreen that says "broad spectrum" so it covers both UVA and UVB rays, and make sure to reapply every 1-2 hours.  Remember to change the batteries in your smoke detectors when changing your clock times in the spring and fall. Carbon monoxide detectors are recommended for your home.  Use your seat belt every time you are in a car, and please drive safely and not be distracted with cell phones and texting while driving.   Nicole Allen , Thank you for taking time to come for your Medicare Wellness Visit. I appreciate your ongoing  commitment to your health goals. Please review the following plan we discussed and let me know if I can assist you in the future.    This is a list of the screening recommended for you and due dates:  Health Maintenance  Topic Date Due  . Tetanus Vaccine  12/27/2015  . Flu Shot  01/30/2021  . Mammogram  09/16/2020  . Colon Cancer Screening  12/18/2021  . DEXA scan (bone density measurement)  Completed  . COVID-19 Vaccine  Completed  .  Hepatitis C: One time screening is recommended by Center for Disease Control  (CDC) for  adults born from [birth year] through 1965.   Completed  . Pneumonia vaccines  Completed  . HPV Vaccine  Aged Out   We have no record of tetanus booster being given--I believe you report that you had gotten one through the health department with other travel vaccines (2019?)  Please schedule your mammogram.  Please get [place] copies of your living will and healthcare power of attorney to be scanned into your chart at your convenience.  We will be referring you for a second opinion with Dr. Sabra Heck or another GYN.  We talked about stopping your statin medication, as another trial off. Continue low cholesterol diet.  Please try and get regular exercise to help with stress and overall health. Please contact psychiatrists and therapists--if you need assistance in getting an appointment, please let us know.   Calcium  Content in Foods Calcium is the most abundant mineral in the body. Most of the body's calcium supply is stored in bones and teeth. Calcium helps many parts of the body function normally, including:  Blood and blood vessels.  Nerves.  Hormones.  Muscles.  Bones and teeth. When your calcium stores are low, you may be at risk for low bone mass, bone loss, and broken bones (fractures). When you get enough calcium, it helps to support strong bones and teeth throughout your life. Calcium is especially important for:  Children during growth spurts.  Girls during  adolescence.  Women who are pregnant or breastfeeding.  Women after their menstrual cycle stops (postmenopause).  Women whose menstrual cycle has stopped due to anorexia nervosa or regular intense exercise.  People who cannot eat or digest dairy products.  Vegans. Recommended daily amounts of calcium:  Women (ages 102 to 72): 1,000 mg per day.  Women (ages 72 and older): 1,200 mg per day.  Men (ages 69 to 7): 1,000 mg per day.  Men (ages 83 and older): 1,200 mg per day.  Women (ages 19 to 42): 1,300 mg per day.  Men (ages 44 to 68): 1,300 mg per day. General information  Eat foods that are high in calcium. Try to get most of your calcium from food.  Some people may benefit from taking calcium supplements. Check with your health care provider or diet and nutrition specialist (dietitian) before starting any calcium supplements. Calcium supplements may interact with certain medicines. Too much calcium may cause other health problems, such as constipation and kidney stones.  For the body to absorb calcium, it needs vitamin D. Sources of vitamin D include: ? Skin exposure to direct sunlight. ? Foods, such as egg yolks, liver, mushrooms, saltwater fish, and fortified milk. ? Vitamin D supplements. Check with your health care provider or dietitian before starting any vitamin D supplements. What foods are high in calcium? Foods that are high in calcium contain more than 100 milligrams per serving. Fruits  Fortified orange juice or other fruit juice, 300 mg per 8 oz serving. Vegetables  Collard greens, 360 mg per 8 oz serving.  Kale, 100 mg per 8 oz serving.  Bok choy, 160 mg per 8 oz serving. Grains  Fortified ready-to-eat cereals, 100 to 1,000 mg per 8 oz serving.  Fortified frozen waffles, 200 mg in 2 waffles.  Oatmeal, 140 mg in 1 cup. Meats and other proteins  Sardines, canned with bones, 325 mg per 3 oz serving.  Salmon, canned with bones, 180 mg per 3 oz  serving.  Canned shrimp, 125 mg per 3 oz serving.  Baked beans, 160 mg per 4 oz serving.  Tofu, firm, made with calcium sulfate, 253 mg per 4 oz serving. Dairy  Yogurt, plain, low-fat, 310 mg per 6 oz serving.  Nonfat milk, 300 mg per 8 oz serving.  American cheese, 195 mg per 1 oz serving.  Cheddar cheese, 205 mg per 1 oz serving.  Cottage cheese 2%, 105 mg per 4 oz serving.  Fortified soy, rice, or almond milk, 300 mg per 8 oz serving.  Mozzarella, part skim, 210 mg per 1 oz serving. The items listed above may not be a complete list of foods high in calcium. Actual amounts of calcium may be different depending on processing. Contact a dietitian for more information.   What foods are lower in calcium? Foods that are lower in calcium contain 50 mg or less per serving. Fruits  Apple,  about 6 mg.  Banana, about 12 mg. Vegetables  Lettuce, 19 mg per 2 oz serving.  Tomato, about 11 mg. Grains  Rice, 4 mg per 6 oz serving.  Boiled potatoes, 14 mg per 8 oz serving.  White bread, 6 mg per slice. Meats and other proteins  Egg, 27 mg per 2 oz serving.  Red meat, 7 mg per 4 oz serving.  Chicken, 17 mg per 4 oz serving.  Fish, cod, or trout, 20 mg per 4 oz serving. Dairy  Cream cheese, regular, 14 mg per 1 Tbsp serving.  Brie cheese, 50 mg per 1 oz serving.  Parmesan cheese, 70 mg per 1 Tbsp serving. The items listed above may not be a complete list of foods lower in calcium. Actual amounts of calcium may be different depending on processing. Contact a dietitian for more information. Summary  Calcium is an important mineral in the body because it affects many functions. Getting enough calcium helps support strong bones and teeth throughout your life.  Try to get most of your calcium from food.  Calcium supplements may interact with certain medicines. Check with your health care provider or dietitian before starting any calcium supplements. This information is  not intended to replace advice given to you by your health care provider. Make sure you discuss any questions you have with your health care provider. Document Revised: 10/14/2019 Document Reviewed: 10/14/2019 Elsevier Patient Education  [place].

## 2020-10-13 ENCOUNTER — Other Ambulatory Visit: Payer: Self-pay

## 2020-10-13 ENCOUNTER — Encounter: Payer: Self-pay | Admitting: Family Medicine

## 2020-10-13 ENCOUNTER — Ambulatory Visit: Payer: Medicare PPO | Admitting: Family Medicine

## 2020-10-13 VITALS — BP 126/70 | HR 76 | Ht 58.5 in | Wt 107.0 lb

## 2020-10-13 DIAGNOSIS — Z0000 Encounter for general adult medical examination without abnormal findings: Secondary | ICD-10-CM

## 2020-10-13 DIAGNOSIS — E039 Hypothyroidism, unspecified: Secondary | ICD-10-CM | POA: Diagnosis not present

## 2020-10-13 DIAGNOSIS — M85851 Other specified disorders of bone density and structure, right thigh: Secondary | ICD-10-CM

## 2020-10-13 DIAGNOSIS — E7800 Pure hypercholesterolemia, unspecified: Secondary | ICD-10-CM | POA: Diagnosis not present

## 2020-10-13 DIAGNOSIS — Z5181 Encounter for therapeutic drug level monitoring: Secondary | ICD-10-CM

## 2020-10-13 DIAGNOSIS — G4733 Obstructive sleep apnea (adult) (pediatric): Secondary | ICD-10-CM | POA: Diagnosis not present

## 2020-10-13 DIAGNOSIS — K219 Gastro-esophageal reflux disease without esophagitis: Secondary | ICD-10-CM | POA: Diagnosis not present

## 2020-10-13 DIAGNOSIS — G4700 Insomnia, unspecified: Secondary | ICD-10-CM

## 2020-10-13 DIAGNOSIS — F411 Generalized anxiety disorder: Secondary | ICD-10-CM

## 2020-10-13 DIAGNOSIS — M7989 Other specified soft tissue disorders: Secondary | ICD-10-CM

## 2020-10-13 DIAGNOSIS — F988 Other specified behavioral and emotional disorders with onset usually occurring in childhood and adolescence: Secondary | ICD-10-CM | POA: Diagnosis not present

## 2020-10-13 DIAGNOSIS — M85852 Other specified disorders of bone density and structure, left thigh: Secondary | ICD-10-CM

## 2020-10-13 DIAGNOSIS — R21 Rash and other nonspecific skin eruption: Secondary | ICD-10-CM

## 2020-10-13 DIAGNOSIS — N840 Polyp of corpus uteri: Secondary | ICD-10-CM

## 2020-10-13 LAB — LIPID PANEL
Chol/HDL Ratio: 2.8 ratio (ref 0.0–4.4)
Cholesterol, Total: 222 mg/dL — ABNORMAL HIGH (ref 100–199)
HDL: 79 mg/dL (ref >39)
LDL Chol Calc (NIH): 124 mg/dL — ABNORMAL HIGH (ref 0–99)
Triglycerides: 110 mg/dL (ref 0–149)
VLDL Cholesterol Cal: 19 mg/dL (ref 5–40)

## 2020-10-13 MED ORDER — HYDROCORTISONE 2.5 % EX OINT: TOPICAL_OINTMENT | Freq: Two times a day (BID) | CUTANEOUS | 1 refills | Status: DC | PRN

## 2020-10-13 MED ORDER — ESZOPICLONE 1 MG PO TABS: 1.0000 mg | ORAL_TABLET | Freq: Every day | ORAL | 0 refills | Status: DC

## 2020-10-13 MED ORDER — AMPHETAMINE-DEXTROAMPHET ER 30 MG PO CP24: 30.0000 mg | ORAL_CAPSULE | ORAL | 0 refills | Status: DC

## 2020-10-24 NOTE — Telephone Encounter (Signed)
Call to patient. Per DPR, OK to leave message on voicemail.  Left voicemail requesting a return call to [facility] to review benefits for recommended surgery with Princess Bruins, MD, [place], [place], [place].  Routing to Dr. Dellis Filbert to advise. Third attempt to reach patient to review benefits for surgery.

## 2020-10-25 NOTE — Telephone Encounter (Signed)
Sent the following message to the patient via [place].  Good morning Beth,  I am following up regarding your conversation about surgery with Dr. Dellis Filbert at your appointment on September 22, 2020. I have left you a few voicemails on your cell phone number, [phone].   Please give our office a call back at your earliest convenience to let us know how you would like to proceed with surgery. I can be reached directly at [phone], prompt [phone] for benefits.  Thank you so much, Adrijana Haros

## 2020-10-27 ENCOUNTER — Encounter: Payer: Self-pay | Admitting: Family Medicine

## 2020-10-27 DIAGNOSIS — R21 Rash and other nonspecific skin eruption: Secondary | ICD-10-CM

## 2020-10-27 DIAGNOSIS — G4700 Insomnia, unspecified: Secondary | ICD-10-CM

## 2020-10-27 MED ORDER — ESZOPICLONE 2 MG PO TABS: 2.0000 mg | ORAL_TABLET | Freq: Every day | ORAL | 0 refills | Status: DC

## 2020-10-27 MED ORDER — HYDROCORTISONE 2.5 % EX OINT: TOPICAL_OINTMENT | Freq: Two times a day (BID) | CUTANEOUS | 0 refills | Status: DC | PRN

## 2020-11-04 ENCOUNTER — Encounter: Payer: Self-pay | Admitting: Family Medicine

## 2020-11-04 DIAGNOSIS — E039 Hypothyroidism, unspecified: Secondary | ICD-10-CM

## 2020-11-04 MED ORDER — SYNTHROID 50 MCG PO TABS: ORAL_TABLET | ORAL | 1 refills | Status: DC

## 2020-11-09 ENCOUNTER — Telehealth: Payer: Medicare PPO | Admitting: Family Medicine

## 2020-11-09 ENCOUNTER — Encounter: Payer: Self-pay | Admitting: Family Medicine

## 2020-11-09 ENCOUNTER — Other Ambulatory Visit: Payer: Self-pay | Admitting: *Deleted

## 2020-11-09 VITALS — Temp 97.9°F | Ht 58.5 in | Wt 107.0 lb

## 2020-11-09 DIAGNOSIS — U071 COVID-19: Secondary | ICD-10-CM

## 2020-11-09 DIAGNOSIS — R059 Cough, unspecified: Secondary | ICD-10-CM | POA: Diagnosis not present

## 2020-11-09 DIAGNOSIS — Z8616 Personal history of COVID-19: Secondary | ICD-10-CM

## 2020-11-09 HISTORY — DX: Personal history of COVID-19: Z86.16

## 2020-11-09 MED ORDER — NIRMATRELVIR/RITONAVIR (PAXLOVID)TABLET
3.0000 | ORAL_TABLET | Freq: Two times a day (BID) | ORAL | 0 refills | Status: DC
Start: 2020-11-09 — End: 2020-11-09

## 2020-11-09 MED ORDER — NIRMATRELVIR/RITONAVIR (PAXLOVID)TABLET: ORAL_TABLET | ORAL | 0 refills | Status: DC

## 2020-11-09 MED ORDER — NIRMATRELVIR/RITONAVIR (PAXLOVID)TABLET: 3.0000 | ORAL_TABLET | Freq: Two times a day (BID) | ORAL | 0 refills | Status: DC

## 2020-11-09 MED ORDER — HYDROCODONE BIT-HOMATROP MBR 5-1.5 MG/5ML PO SOLN: 5.0000 mL | Freq: Four times a day (QID) | ORAL | 0 refills | Status: DC | PRN

## 2020-11-09 MED ORDER — NIRMATRELVIR/RITONAVIR (PAXLOVID)TABLET: 3.0000 | ORAL_TABLET | Freq: Two times a day (BID) | ORAL | 0 refills | Status: AC

## 2020-11-09 NOTE — Telephone Encounter (Signed)
Can we double book a spot next week with Dr. Claiborne Billings? I don't see anything available until the October schedule opens up.

## 2020-11-09 NOTE — Progress Notes (Signed)
Start time: 11 End time: 11:20  Virtual Visit via Video Note  I connected with Nicole Allen on 11/09/20 by a video enabled telemedicine application and verified that I am speaking with the correct person using two identifiers.  Location: Patient: home Provider: office   I discussed the limitations of evaluation and management by telemedicine and the availability of in person appointments. The patient expressed understanding and agreed to proceed.  History of Present Illness:  Chief Complaint  Patient presents with  . Cough    VIRTUAL cough, fever and chills. Tired and lack of energy. Husband positive covid Saturday. She had negative covid at home tests Monday and yesterday. Her symptoms including scratchy throat started Sunday. Cough has worsened. Tried some advil a few days ago. Has only taken her synthroid and lunesta for the last few days.    Scratchy throat started Saturday night (5/7).  5/8 she developed fatigue.She has persistent scratchy throat, cough has worsened, affecting her sleep.  Cough is nonproductive.  She has mild congestion and PND.  Denies sinus pain.  She had low grade fever yesterday, some body aches yesterday, relieved by ibuprofen.  Husband started Paxlovid Saturday night, is already improving. She is requesting rx for Paxlovid. Hasn't yet had a positive COVID test.  Her main complaint is cough. Previously tried Games developer is worthless) Asking for cough syrup, reports that hydrocodone makes her itch, but she is fine if she also takes benadryl  PMH, PSH, SH reviewed  Outpatient Encounter Medications as of 11/09/2020  Medication Sig  . eszopiclone (LUNESTA) 2 MG TABS tablet Take 1 tablet (2 mg total) by mouth at bedtime. Take immediately before bedtime  . HYDROcodone bit-homatropine (HYCODAN) 5-1.5 MG/5ML syrup Take 5 mLs by mouth every 6 (six) hours as needed for cough.  . ibandronate (BONIVA) 150 MG tablet Take 1 tablet by mouth every 30  days--on empty stomach, with a full glass of water. No food/meds or laying down for 30 min,  . SYNTHROID 50 MCG tablet TAKE 1 TABLET ONCE DAILY BEFORE BREAKFAST MON THRU SAT, 2 TABLETS ON SUNDAYS  . [DISCONTINUED] nirmatrelvir/ritonavir EUA (PAXLOVID) TABS Take 3 tablets by mouth 2 (two) times daily for 5 days. Patient GFR is 92 Take nirmatrelvir (150 mg) 3 tablet(s) twice daily for 5 days and ritonavir (100 mg) one tablet twice daily for 5 days.  . ALPRAZolam (XANAX) 0.25 MG tablet Take 1-2 tablets (0.25-0.5 mg total) by mouth 2 (two) times daily as needed for anxiety. (Patient not taking: No sig reported)  . amphetamine-dextroamphetamine (ADDERALL XR) 30 MG 24 hr capsule Take 1 capsule (30 mg total) by mouth every morning. (Patient not taking: Reported on 11/09/2020)  . cetirizine (ZYRTEC) 10 MG tablet Take 10 mg by mouth daily. (Patient not taking: Reported on 11/09/2020)  . Coenzyme Q10 (COQ10 PO) Take 1 tablet by mouth daily. (Patient not taking: Reported on 11/09/2020)  . conjugated estrogens (PREMARIN) vaginal cream Insert 1 applicator twice weekly vaginally. (Patient not taking: Reported on 11/09/2020)  . famotidine (PEPCID) 40 MG tablet Take 40 mg by mouth daily. (Patient not taking: Reported on 11/09/2020)  . hydrocortisone 2.5 % ointment Apply topically 2 (two) times daily as needed (rash on nose). (Patient not taking: Reported on 11/09/2020)  . Multiple Vitamin (MULTIVITAMIN) tablet Take 1 tablet by mouth daily. (Patient not taking: Reported on 11/09/2020)  . mupirocin ointment (BACTROBAN) 2 % Place 1 application into the nose as directed.  (Patient not taking: Reported on 11/09/2020)  .  omeprazole (PRILOSEC) 40 MG capsule Take 1 capsule (40 mg total) by mouth daily. Take before dinner (Patient not taking: Reported on 11/09/2020)  . simvastatin (ZOCOR) 10 MG tablet TAKE ONE TABLET AT BEDTIME. (Patient not taking: Reported on 11/09/2020)  . TURMERIC PO Take by mouth daily. (Patient not taking:  Reported on 11/09/2020)  . [DISCONTINUED] nirmatrelvir/ritonavir EUA (PAXLOVID) TABS Take 3 tablets by mouth 2 (two) times daily for 5 days. Patient GFR is 92 Take nirmatrelvir (150 mg) 3 tablet(s) twice daily for 5 days and ritonavir (100 mg) one tablet twice daily for 5 days.   No facility-administered encounter medications on file as of 11/09/2020.   NOT taking hycodan or paxlovid prior to today's visit  Allergies  Allergen Reactions  . Morphine And Related Other (See Comments)    Family history.  . Codeine Itching and Rash   ROS:  Per HPI--URI symptoms, fatigue, body aches, cough. No n/v/d, rash, chest pain, shortness of breath.    Observations/Objective:  Temp 97.9 F (36.6 C) (Tympanic)   Ht 4' 10.5" (1.486 m)   Wt 107 lb (48.5 kg) Comment: per patient  LMP  (LMP Unknown)   BMI 21.98 kg/m   Well-appearing female, in no distress. Some coughing during visit, slightly congested. She is speaking easily, in no distress. Cranial nerves grossly intact. Exam is limited due to virtual nature of the visit.  Assessment and Plan:  COVID-19 - Patient repeated home test after video visit, + COVID.  Sent in [place]. Supportive measures reviewed. f/u if sx persist/worsen - Plan: DISCONTINUED: nirmatrelvir/ritonavir EUA (PAXLOVID) TABS, DISCONTINUED: nirmatrelvir/ritonavir EUA (PAXLOVID) TABS  Cough - declined tessalon. Reviewed risks/SE of hydrocodone, has itching, plans to use benadryl with it. Sedating risks along with other meds reviewed - Plan: HYDROcodone bit-homatropine (HYCODAN) 5-1.5 MG/5ML syrup   Do home test, and contact us with results.--POSITIVE per MyChart message  Risks/precautions reviewed Also recommended use of Mucinex/robitussin for cough.   Follow Up Instructions:    I discussed the assessment and treatment plan with the patient. The patient was provided an opportunity to ask questions and all were answered. The patient agreed with the plan and demonstrated  an understanding of the instructions.   The patient was advised to call back or seek an in-person evaluation if the symptoms worsen or if the condition fails to improve as anticipated.  I spent 25 minutes dedicated to the care of this patient, including pre-visit review of records, face to face time, post-visit ordering of testing and documentation.    Vikki Ports, MD

## 2020-11-09 NOTE — Patient Instructions (Signed)
Stay well hydrated. Use mucinex/guaifenesin to help keep mucus/phlegm thin. Use the hydrocodone syrup at bedtime for cough. You may use this during the day if needed, but do not drive, can cause sedation and worsen constipation.  You may want to skip the lunesta, and use 1/2 tablet if you can't sleep despite the benadryl and hydrocodone syrup.  Contact us if you develop worsening cough, pain with breathing shortness of breath, high fevers, or other concerning symptoms--these likely warrant trip to ER for evaluation.  I hope that you feel better quickly with the Paxlovid.  Do not take the statin medication while taking the Paxlovid.

## 2020-11-09 NOTE — Telephone Encounter (Signed)
Patient picked up at [facility]. 

## 2020-11-09 NOTE — Telephone Encounter (Signed)
Patient picked up at [facility].

## 2020-11-17 ENCOUNTER — Encounter: Payer: Self-pay | Admitting: Cardiovascular Disease

## 2020-11-17 ENCOUNTER — Ambulatory Visit: Payer: Medicare PPO | Admitting: Cardiovascular Disease

## 2020-11-17 ENCOUNTER — Other Ambulatory Visit: Payer: Self-pay

## 2020-11-17 VITALS — BP 146/62 | HR 72 | Ht <= 58 in | Wt 106.0 lb

## 2020-11-17 DIAGNOSIS — G4733 Obstructive sleep apnea (adult) (pediatric): Secondary | ICD-10-CM | POA: Diagnosis not present

## 2020-11-17 DIAGNOSIS — F909 Attention-deficit hyperactivity disorder, unspecified type: Secondary | ICD-10-CM | POA: Diagnosis not present

## 2020-11-17 NOTE — Progress Notes (Deleted)
Cardiology Office Note    Date:  11/17/2020   ID:  Nicole Allen, DOB Oct 07, 1950, MRN [id number]  PCP:  Rita Ohara, MD  Cardiologist:  Shelva Majestic, MD   No chief complaint on file.   History of Present Illness:  Nicole Allen is a 70 y.o. female ***    Past Medical History:  Diagnosis Date  . ADD (attention deficit disorder)   . Allergy    SEASONAL  . Anxiety   . BCC (basal cell carcinoma of skin)    chest; Dr. Tonia Brooms  . BCC (basal cell carcinoma)    Tonia Brooms  . Cervical spondylosis   . GERD (gastroesophageal reflux disease)   . Hearing loss    high frequency (Dr. Prescott Parma); hearing aids recommended; bilateral  . History of kidney stones    x3  . Hypothyroidism 2015  . Insomnia    worse with menopause  . Interstitial keratitis 2021   under care of ophtho  . Kidney stone    x3, Dr. Reece Agar  . Osteopenia   . PONV (postoperative nausea and vomiting)   . Postmenopausal    on HRT--sees GYN  . Pure hypercholesterolemia   . Sleep apnea    uses CPAP    Past Surgical History:  Procedure Laterality Date  . CATARACT EXTRACTION Bilateral L 10/18/15 R 12/13/15   Dr. Herbert Deaner  . CESAREAN SECTION  [id number]  . COLONOSCOPY  2003, 2013   Dr. Collene Mares  . HYMENECTOMY  1993  . KNEE SURGERY Right 09/29/2014   Dr. Jim Desanctis replacement/reconstruction  . NASAL SEPTOPLASTY W/ TURBINOPLASTY Bilateral 11/11/2019   Procedure: NASAL SEPTOPLASTY WITH TURBINATE REDUCTION;  Surgeon: Jerrell Belfast, MD;  Location: [facility];  Service: ENT;  Laterality: Bilateral;  . TONSILLECTOMY AND ADENOIDECTOMY    . TUBAL LIGATION  1991    Current Medications: Outpatient Medications Prior to Visit  Medication Sig Dispense Refill  . ALPRAZolam (XANAX) 0.25 MG tablet Take 1-2 tablets (0.25-0.5 mg total) by mouth 2 (two) times daily as needed for anxiety. 15 tablet 0  . amphetamine-dextroamphetamine (ADDERALL XR) 30 MG 24 hr capsule Take 1 capsule (30 mg total) by mouth every  morning. 90 capsule 0  . cetirizine (ZYRTEC) 10 MG tablet Take 10 mg by mouth daily.    . Coenzyme Q10 (COQ10 PO) Take 1 tablet by mouth daily.    Marland Kitchen conjugated estrogens (PREMARIN) vaginal cream Insert 1 applicator twice weekly vaginally. 42.5 g 4  . eszopiclone (LUNESTA) 2 MG TABS tablet Take 1 tablet (2 mg total) by mouth at bedtime. Take immediately before bedtime 90 tablet 0  . famotidine (PEPCID) 40 MG tablet Take 40 mg by mouth daily.    . hydrocortisone 2.5 % ointment Apply topically 2 (two) times daily as needed (rash on nose). 30 g 0  . ibandronate (BONIVA) 150 MG tablet Take 1 tablet by mouth every 30 days--on empty stomach, with a full glass of water. No food/meds or laying down for 30 min, 3 tablet 11  . Multiple Vitamin (MULTIVITAMIN) tablet Take 1 tablet by mouth daily.    . mupirocin ointment (BACTROBAN) 2 % Place 1 application into the nose as directed.    Marland Kitchen omeprazole (PRILOSEC) 40 MG capsule Take 1 capsule (40 mg total) by mouth daily. Take before dinner 90 capsule 0  . SYNTHROID 50 MCG tablet TAKE 1 TABLET ONCE DAILY BEFORE BREAKFAST MON THRU SAT, 2 TABLETS ON SUNDAYS 102 tablet 1  . TURMERIC PO Take by  mouth daily.    Marland Kitchen HYDROcodone bit-homatropine (HYCODAN) 5-1.5 MG/5ML syrup Take 5 mLs by mouth every 6 (six) hours as needed for cough. 100 mL 0  . simvastatin (ZOCOR) 10 MG tablet TAKE ONE TABLET AT BEDTIME. 90 tablet 0   No facility-administered medications prior to visit.     Allergies:   Morphine and related and Codeine   Social History   Socioeconomic History  . Marital status: Married    Spouse name: Not on file  . Number of children: 2  . Years of education: Not on file  . Highest education level: Not on file  Occupational History  . Occupation: retired Teaching laboratory technician  Tobacco Use  . Smoking status: Never Smoker  . Smokeless tobacco: Never Used  Vaping Use  . Vaping Use: Never used  Substance and Sexual Activity  . Alcohol use: Yes    Alcohol/week:  0.0 standard drinks    Comment: 3-4/week  . Drug use: No  . Sexual activity: Yes    Partners: Male    Comment: 1st intercourse- 85, partners- 61, married- 29 yrs   Other Topics Concern  . Not on file  Social History Narrative   Lives with husband.  Son in [place], [place] in [place], Daughter is in [place]. Has 4 grandsons (2 here, 2 in [place]). Grandson (born 04/2015, in [place], with speech delay/learning issues).   Son with alcohol issues (and arrested for domestic violence summer/fall 2017)      Social Determinants of Health   Financial Resource Strain: Not on file  Food Insecurity: Not on file  Transportation Needs: Not on file  Physical Activity: Not on file  Stress: Not on file  Social Connections: Not on file     Family History:  The patient's ***family history includes ADD / ADHD in her daughter and son; Alzheimer's disease in her maternal grandmother; Arthritis in her mother; Asthma in her mother; Cancer in her maternal grandfather and paternal grandfather; Heart disease in her father; Hyperlipidemia in her father and mother; Hypertension in her father and mother; Osteoporosis in her mother; Sleep apnea in her sister.   ROS General: Negative; No fevers, chills, or night sweats;  HEENT: Negative; No changes in vision or hearing, sinus congestion, difficulty swallowing Pulmonary: Negative; No cough, wheezing, shortness of breath, hemoptysis Cardiovascular: Negative; No chest pain, presyncope, syncope, palpitations GI: Negative; No nausea, vomiting, diarrhea, or abdominal pain GU: Negative; No dysuria, hematuria, or difficulty voiding Musculoskeletal: Negative; no myalgias, joint pain, or weakness Hematologic/Oncology: Negative; no easy bruising, bleeding Endocrine: Negative; no heat/cold intolerance; no diabetes Neuro: Negative; no changes in balance, headaches Skin: Negative; No rashes or skin lesions Psychiatric: Negative; No behavioral problems, depression Sleep: Negative; No  snoring, daytime sleepiness, hypersomnolence, bruxism, restless legs, hypnogognic hallucinations, no cataplexy Other comprehensive 14 point system review is negative.   PHYSICAL EXAM:   VS:  BP (!) 146/62   Pulse 72   Ht _0  (1.473 m)   Wt 106 lb (48.1 kg)   LMP  (LMP Unknown)   SpO2 99%   BMI 22.15 kg/m    Wt Readings from Last 3 Encounters:  11/17/20 106 lb (48.1 kg)  11/09/20 107 lb (48.5 kg)  10/13/20 107 lb (48.5 kg)    General: Alert, oriented, no distress.  Skin: normal turgor, no rashes, warm and dry HEENT: Normocephalic, atraumatic. Pupils equal round and reactive to light; sclera anicteric; extraocular muscles intact; Fundi ** Nose without nasal septal hypertrophy Mouth/Parynx benign; Mallinpatti scale Neck:  No JVD, no carotid bruits; normal carotid upstroke Lungs: clear to ausculatation and percussion; no wheezing or rales Chest wall: without tenderness to palpitation Heart: PMI not displaced, RRR, s1 s2 normal, 1/6 systolic murmur, no diastolic murmur, no rubs, gallops, thrills, or heaves Abdomen: soft, nontender; no hepatosplenomehaly, BS+; abdominal aorta nontender and not dilated by palpation. Back: no CVA tenderness Pulses 2+ Musculoskeletal: full range of motion, normal strength, no joint deformities Extremities: no clubbing cyanosis or edema, Homan's sign negative  Neurologic: grossly nonfocal; Cranial nerves grossly wnl Psychologic: Normal mood and affect   Studies/Labs Reviewed:   EKG:  EKG is ordered today. ECG (independently read by me): NSR at 72     Recent Labs: BMP Latest Ref Rng & Units 05/23/2020 09/22/2019 09/16/2018  Glucose 65 - 99 mg/dL 102(H) 93 88  BUN 8 - 27 mg/dL _0 Creatinine 0.57 - 1.00 mg/dL 0.62 0.63 0.62  BUN/Creat Ratio 12 - _1 Sodium 134 - 144 mmol/L 143 142 141  Potassium 3.5 - 5.2 mmol/L 4.5 4.9 4.7  Chloride 96 - 106 mmol/L 103 103 101  CO2 20 - 29 mmol/L _2 Calcium 8.7 - 10.3 mg/dL 9.4 9.4 9.5      Hepatic Function Latest Ref Rng & Units 05/23/2020 09/22/2019 09/16/2018  Total Protein 6.0 - 8.5 g/dL 6.5 6.6 6.5  Albumin 3.8 - 4.8 g/dL 4.5 4.4 4.3  AST 0 - 40 IU/L _3 ALT 0 - 32 IU/L _4 Alk Phosphatase 44 - 121 IU/L 58 64 68  Total Bilirubin 0.0 - 1.2 mg/dL <0.2 0.4 0.4  Bilirubin, Direct 0.0 - 0.2 mg/dL - - -    CBC Latest Ref Rng & Units 05/23/2020 09/22/2019 09/16/2018  WBC 3.4 - 10.8 x10E3/uL 5.7 5.5 5.8  Hemoglobin 11.1 - 15.9 g/dL 13.0 12.5 12.6  Hematocrit 34.0 - 46.6 % 37.9 35.5 36.0  Platelets 150 - 450 x10E3/uL 257 239 230   Lab Results  Component Value Date   MCV 86 05/23/2020   MCV 86 09/22/2019   MCV 85 09/16/2018   Lab Results  Component Value Date   TSH 1.350 05/23/2020   No results found for: HGBA1C   BNP No results found for: BNP  ProBNP No results found for: PROBNP   Lipid Panel     Component Value Date/Time   CHOL 222 (H) 10/13/2020 0900   TRIG 110 10/13/2020 0900   HDL 79 10/13/2020 0900   CHOLHDL 2.8 10/13/2020 0900   CHOLHDL 2.1 07/05/2016 0724   VLDL 21 07/05/2016 0724   LDLCALC 124 (H) 10/13/2020 0900   LABVLDL 19 10/13/2020 0900     RADIOLOGY: No results found.   Additional studies/ records that were reviewed today include:  ***    ASSESSMENT:    1. Obstructive sleep apnea (adult) (pediatric)   2. Attention deficit hyperactivity disorder (ADHD), unspecified ADHD type      PLAN:  ***   Medication Adjustments/Labs and Tests Ordered: Current medicines are reviewed at length with the patient today.  Concerns regarding medicines are outlined above.  Medication changes, Labs and Tests ordered today are listed in the Patient Instructions below. Patient Instructions  Medication Instructions:  Your physician recommends that you continue on your current medications as directed. Please refer to the Current Medication list given to you today.  *If you need a refill on your cardiac medications before your  next appointment, please call your pharmacy*  Lab Work: None ordered.   Testing/Procedures: None ordered.    Follow-Up: At [facility], you and your health needs are our priority.  As part of our continuing mission to provide you with exceptional heart care, we have created designated Provider Care Teams.  These Care Teams include your primary Cardiologist (physician) and Advanced Practice Providers (APPs -  Physician Assistants and Nurse Practitioners) who all work together to provide you with the care you need, when you need it.  We recommend signing up for the patient portal called "MyChart".  Sign up information is provided on this After Visit Summary.  MyChart is used to connect with patients for Virtual Visits (Telemedicine).  Patients are able to view lab/test results, encounter notes, upcoming appointments, etc.  Non-urgent messages can be sent to your provider as well.   To learn more about what you can do with MyChart, go to [email or web address].    Your next appointment:   3 month(s)  The format for your next appointment:   In Person  Provider:   Shelva Majestic, MD       Signed, Shelva Majestic, MD  11/17/2020 12:24 PM    [facility] [address] [place], [place], [place]  [address] Phone: [phone]

## 2020-11-17 NOTE — Patient Instructions (Signed)
Medication Instructions:  Your physician recommends that you continue on your current medications as directed. Please refer to the Current Medication list given to you today.  *If you need a refill on your cardiac medications before your next appointment, please call your pharmacy*   Lab Work: None ordered.   Testing/Procedures: None ordered.    Follow-Up: At [facility], you and your health needs are our priority.  As part of our continuing mission to provide you with exceptional heart care, we have created designated Provider Care Teams.  These Care Teams include your primary Cardiologist (physician) and Advanced Practice Providers (APPs -  Physician Assistants and Nurse Practitioners) who all work together to provide you with the care you need, when you need it.  We recommend signing up for the patient portal called "MyChart".  Sign up information is provided on this After Visit Summary.  MyChart is used to connect with patients for Virtual Visits (Telemedicine).  Patients are able to view lab/test results, encounter notes, upcoming appointments, etc.  Non-urgent messages can be sent to your provider as well.   To learn more about what you can do with MyChart, go to [email or web address].    Your next appointment:   3 month(s)  The format for your next appointment:   In Person  Provider:   Shelva Majestic, MD

## 2020-11-19 ENCOUNTER — Encounter: Payer: Self-pay | Admitting: Cardiovascular Disease

## 2020-11-19 NOTE — Progress Notes (Signed)
Cardiology Office Note    Date:  11/19/2020   ID:  CLAIRESSA Allen, DOB 04-22-1951, MRN [id number]  PCP:  Joselyn Arrow, MD  Cardiologist:  Nicki Guadalajara, MD    On year follow-up evaluation  History of Present Illness:  Nicole Allen is a 70 y.o. female who had undergone a sleep study interpreted by Dr. Fannie Knee in August 2017.  The patient  continued to have significant difficulty with sleep and is was referred by Dr. Lynelle Doctor for sleep evaluation and possible restless legs.  I saw her for initial evaluation on 04/16/2017, and last saw her in November 2019. She presents for F/U evaluation after CPAP institution.  Nicole Allen has a history of hypothyroidism, limb movements of sleep, attention deficit disorder, hyperlipidemia, and GERD.  She was having significant difficulty with sleep with some insomnia and ultimately was referred for sleep study in 02/24/2016 which was interpreted by Dr. Maple Hudson.  I personally reviewed this sleep study in detail.  The patient had reduced sleep efficiency at 51.7%.  She had markedly prolonged rim latency at 266 minutes.  She also had significant reduction in rems sleep at only 4%.  Wake after sleep onset was 137 minutes.  She was found to have an overall AHI of 4.5 per hour.  Do not see any mention of the respiratory disturbance index, but I suspect this was increased based on at least 17 respiratory effort related arousals.  AHI while sleeping supine, was 11.7 and consistent with mild sleep apnea.  She had moderate snoring.  There was no significant oxygen desaturation.  She had slight increase in periodic limb movements of sleep at 51, resulting in index of 16.36.  Associated arousals with leg movement index was 5.8. When I initially saw her, she the patient denied any urge to move or painful restless legs , and her symptoms were not suggestive of restless leg syndrome.  Reportedly, she was told to have a trial of Mirapex by Dr. Lynelle Doctor, which she never did.  She had  difficulty with sleep initiation and maintenance.  When I initially saw her she was having difficulty particularly with sleep initiation and maintenance.  She typically goes to bed between 10 and 11 PM at night, but may stay awake for several hours before she falls asleep.  However, if she stays up until 1 AM and tries to go to sleep then she typically falls asleep in about 30 minutes.  She often wakes up between 8:30 AM and 10 AM.  She notes frequent awakenings.  She was given a prescription for trazodone for sleep, which has not been very helpful.  She did not tolerate Ambien in the past.  She was complaining of being tired.  When the day and was at a point where she not feel safe driving toward the daytime since she may fall a sleep.   When I saw her, I recommended a trial of Lunesta 2 mg, which would be helpful both for sleep initiation and maintenance.  She had issues with her insurance company covering this, but ultimately she instituted this therapy.  I referred her for follow-up sleep study which was done on 06/08/2017.  Unfortunately, this was the night of the snowstorm.  She did not have a good experience in the lab, particularly with reference to the sleep tech.  She often sleeps with a sound machine and the tech would not let her use it as result, she was very anxious and again had difficulty with  sleep initiation.  Of note, since starting the Lunesta at home, she states that this has been very effective 90% of the time and allowing her to fall sleep within 30 minutes and usually sleep duration and maintenance was significantly improved.  Unfortunately, the night of the sleep study because of difficulty with sleep initiation, a cold environment, and not feeling comfortable with tech sleep efficiency was poor at 51.4%.  Wake after sleep onset was 161 minutes.  Her AHIwas only minimally increased at 5.4 per hour with an RDI of 6.9 per hour.  There was a positional component with supine sleep, AHI of  11.6.  She was unable to achieve rems sleep.  In contrast, since she had been on Lunesta at home and was sleeping for longer duration.  She had noticed over the past month that she was dreaming.  Interestingly, she feels that since she is started [name].  She is not moving her legs were feeling this jittery sensation.  In contrast to her previous sleep study, on her most recent sleep study she did not have any periodic limb movement disorder of sleep.  She continues to take Aldderiall XR for her ADD.  She is on omeprazole for GERD.  She takes simvastatin 10 mg at bedtime for hyperlipidemia.  She also is on levothyroxine 50 g.    At her office visit on December 17,2018 I further titrated Lunesta to 3 mg daily.  She believes that she is sleeping and dreaming well.  However she continues to experience significant fatigue.  She typically goes to bed at 11 PM.  She often reads a Kindl before going to sleep.    She underwent repeat sleep evaluation and had a diagnostic polysomnogram on February 25, 2018.  AHI was 9.6/h, RDI 16.9/h.  AHI during supine sleep was 60.9/h.  Minimum oxygen saturation was 89%.  He underwent a CPAP titration trial on May 06, 2018 and was titrated up to 11 cm water pressure.  AHI at 11 cm was 0 with an oxygen nadir of 95%.  During that study, he had taken her almost 2 hours to fall asleep and this was predominantly due to a sense of cold air on her upper lip.  Apparently she was given a full facemask for her titration trial.  She wanted to further discuss CPAP prior to implementing treatment and ice saw her May 19, 2018 for follow-up evaluation.  I saw her in February and over the several prior months she was utilizing CPAP therapy.  She noted significant improvement in her energy level and felt better.  U  I obtained a download in the [facility] July 26, 2018 through August 24, 2018.  She has excellent compliance and is averaging 7 hours and 45 minutes of use per night.  She  has been set at an auto pressure of 6-14.  AHI is 1.9.  There is no leak.  Her 95th percentile pressure is 10.2 with a maximum average pressure of 11.5.  She has had an intermittent stuffy nose and when this occurs the nasal treatment is not as smooth since she feels she may have to breathe at times from her mouth.  He denies chest pain PND orthopnea.  She denies residual daytime sleepiness.   I last evaluated her in a telemedicine visit in April 2021.  At that time she was having progressive obstruction to her right nostril secondary to his a significant deviated septum.  She was using nasal pillows.  She was to undergo  surgery for her deviated septum by Dr. Wilburn Cornelia.  She had been on AutoPap pressure.  Since I last saw her, she underwent successful surgery for her deviated septum.  However, recently she has not been able to use CPAP because she states she has a rash in her nose but also has had irritation resulting from the nasal p pillow mask.  Over the past several months, she is only used CPAP on 2 days.  She does not feel as alert and her sleep is not as restorative.  She presents for yearly evaluation.   Past Medical History:  Diagnosis Date  . ADD (attention deficit disorder)   . Allergy    SEASONAL  . Anxiety   . BCC (basal cell carcinoma of skin)    chest; Dr. Tonia Brooms  . BCC (basal cell carcinoma)    Tonia Brooms  . Cervical spondylosis   . GERD (gastroesophageal reflux disease)   . Hearing loss    high frequency (Dr. Prescott Parma); hearing aids recommended; bilateral  . History of kidney stones    x3  . Hypothyroidism 2015  . Insomnia    worse with menopause  . Interstitial keratitis 2021   under care of ophtho  . Kidney stone    x3, Dr. Reece Agar  . Osteopenia   . PONV (postoperative nausea and vomiting)   . Postmenopausal    on HRT--sees GYN  . Pure hypercholesterolemia   . Sleep apnea    uses CPAP    Past Surgical History:  Procedure Laterality Date  . CATARACT EXTRACTION  Bilateral L 10/18/15 R 12/13/15   Dr. Herbert Deaner  . CESAREAN SECTION  [id number]  . COLONOSCOPY  2003, 2013   Dr. Collene Mares  . HYMENECTOMY  1993  . KNEE SURGERY Right 09/29/2014   Dr. Jim Desanctis replacement/reconstruction  . NASAL SEPTOPLASTY W/ TURBINOPLASTY Bilateral 11/11/2019   Procedure: NASAL SEPTOPLASTY WITH TURBINATE REDUCTION;  Surgeon: Jerrell Belfast, MD;  Location: [facility];  Service: ENT;  Laterality: Bilateral;  . TONSILLECTOMY AND ADENOIDECTOMY    . TUBAL LIGATION  1991    Current Medications: Outpatient Medications Prior to Visit  Medication Sig Dispense Refill  . ALPRAZolam (XANAX) 0.25 MG tablet Take 1-2 tablets (0.25-0.5 mg total) by mouth 2 (two) times daily as needed for anxiety. 15 tablet 0  . amphetamine-dextroamphetamine (ADDERALL XR) 30 MG 24 hr capsule Take 1 capsule (30 mg total) by mouth every morning. 90 capsule 0  . cetirizine (ZYRTEC) 10 MG tablet Take 10 mg by mouth daily.    . Coenzyme Q10 (COQ10 PO) Take 1 tablet by mouth daily.    Marland Kitchen conjugated estrogens (PREMARIN) vaginal cream Insert 1 applicator twice weekly vaginally. 42.5 g 4  . eszopiclone (LUNESTA) 2 MG TABS tablet Take 1 tablet (2 mg total) by mouth at bedtime. Take immediately before bedtime 90 tablet 0  . famotidine (PEPCID) 40 MG tablet Take 40 mg by mouth daily.    . hydrocortisone 2.5 % ointment Apply topically 2 (two) times daily as needed (rash on nose). 30 g 0  . ibandronate (BONIVA) 150 MG tablet Take 1 tablet by mouth every 30 days--on empty stomach, with a full glass of water. No food/meds or laying down for 30 min, 3 tablet 11  . Multiple Vitamin (MULTIVITAMIN) tablet Take 1 tablet by mouth daily.    . mupirocin ointment (BACTROBAN) 2 % Place 1 application into the nose as directed.    Marland Kitchen omeprazole (PRILOSEC) 40 MG capsule Take 1 capsule (40 mg  total) by mouth daily. Take before dinner 90 capsule 0  . SYNTHROID 50 MCG tablet TAKE 1 TABLET ONCE DAILY BEFORE BREAKFAST MON THRU SAT,  2 TABLETS ON SUNDAYS 102 tablet 1  . TURMERIC PO Take by mouth daily.    Marland Kitchen HYDROcodone bit-homatropine (HYCODAN) 5-1.5 MG/5ML syrup Take 5 mLs by mouth every 6 (six) hours as needed for cough. 100 mL 0  . simvastatin (ZOCOR) 10 MG tablet TAKE ONE TABLET AT BEDTIME. 90 tablet 0   No facility-administered medications prior to visit.     Allergies:   Morphine and related and Codeine   Social History   Socioeconomic History  . Marital status: Married    Spouse name: Not on file  . Number of children: 2  . Years of education: Not on file  . Highest education level: Not on file  Occupational History  . Occupation: retired Teaching laboratory technician  Tobacco Use  . Smoking status: Never Smoker  . Smokeless tobacco: Never Used  Vaping Use  . Vaping Use: Never used  Substance and Sexual Activity  . Alcohol use: Yes    Alcohol/week: 0.0 standard drinks    Comment: 3-4/week  . Drug use: No  . Sexual activity: Yes    Partners: Male    Comment: 1st intercourse- 56, partners- 31, married- 54 yrs   Other Topics Concern  . Not on file  Social History Narrative   Lives with husband.  Son in [place], [place] in [place], Daughter is in [place]. Has 4 grandsons (2 here, 2 in [place]). Grandson (born 04/2015, in [place], with speech delay/learning issues).   Son with alcohol issues (and arrested for domestic violence summer/fall 2017)      Social Determinants of Health   Financial Resource Strain: Not on file  Food Insecurity: Not on file  Transportation Needs: Not on file  Physical Activity: Not on file  Stress: Not on file  Social Connections: Not on file     Additional social history is notable that she was born in [place], [place].  She is a Engineer, water by trade.  She has ADD.  There is no tobacco history.  Family History:  The patient's family history includes ADD / ADHD in her daughter and son; Alzheimer's disease in her maternal grandmother; Arthritis in her mother; Asthma in her mother;  Cancer in her maternal grandfather and paternal grandfather; Heart disease in her father; Hyperlipidemia in her father and mother; Hypertension in her father and mother; Osteoporosis in her mother; Sleep apnea in her sister.   Her mother died at 32, father died at age 32 and had suffered a myocardial infarction.  She has one sister with obstructive sleep apnea and her father also had obstructive sleep apnea.  ROS General: Negative; No fevers, chills, or night sweats;  HEENT: Previous nasal congestion and significant obstruction due to deviated septum.  Status post recent surgery.  Sclerosis Pulmonary: Negative; No cough, wheezing, shortness of breath, hemoptysis Cardiovascular: Negative; No chest pain, presyncope, syncope, palpitations GI: Negative; No nausea, vomiting, diarrhea, or abdominal pain GU: Negative; No dysuria, hematuria, or difficulty voiding Musculoskeletal: Negative; no myalgias, joint pain, or weakness Hematologic/Oncology: Negative; no easy bruising, bleeding Endocrine: Negative; no heat/cold intolerance; no diabetes Neuro: Negative; no changes in balance, headaches Skin: Negative; No rashes or skin lesions Psychiatric: Negative; No behavioral problems, depression Sleep: Positive for obstructive sleep apnea, now on CPAP with significant improvement in prior snoring and fatigability. no hypnogognic hallucinations, no cataplexy Other comprehensive 14 point system review  is negative.   PHYSICAL EXAM:   VS:  BP (!) 146/62   Pulse 72   Ht _0  (1.473 m)   Wt 106 lb (48.1 kg)   LMP  (LMP Unknown)   SpO2 99%   BMI 22.15 kg/m     Repeat blood pressure by me was 118/66  Wt Readings from Last 3 Encounters:  11/17/20 106 lb (48.1 kg)  11/09/20 107 lb (48.5 kg)  10/13/20 107 lb (48.5 kg)      Physical Exam BP (!) 146/62   Pulse 72   Ht _1  (1.473 m)   Wt 106 lb (48.1 kg)   LMP  (LMP Unknown)   SpO2 99%   BMI 22.15 kg/m  General: Alert, oriented, no distress.   Skin: normal turgor, no rashes, warm and dry HEENT: Normocephalic, atraumatic. Pupils equal round and reactive to light; sclera anicteric; extraocular muscles intact;  Nose; mild erythema and irritation around the nares Mouth/Parynx benign; Mallinpatti scale 3 Neck: No JVD, no carotid bruits; normal carotid upstroke Lungs: clear to ausculatation and percussion; no wheezing or rales Chest wall: without tenderness to palpitation Heart: PMI not displaced, RRR, s1 s2 normal, 1/6 systolic murmur, no diastolic murmur, no rubs, gallops, thrills, or heaves Abdomen: soft, nontender; no hepatosplenomehaly, BS+; abdominal aorta nontender and not dilated by palpation. Back: no CVA tenderness Pulses 2+ Musculoskeletal: full range of motion, normal strength, no joint deformities Extremities: no clubbing cyanosis or edema, Homan's sign negative  Neurologic: grossly nonfocal; Cranial nerves grossly wnl Psychologic: Normal mood and affect   Studies/Labs Reviewed:   EKG:  EKG is ordered today. ECG (independently read by me): NSR at 71; normal intervals  05/19/2018 ECG (independently read by me): NSR with mild sinus arrythmia  July 2019 ECG (independently read by me): Sinus rhythm at 83 bpm.  No ectopy.  QTc interval 479 NicoleC  December 2018 ECG (independently read by me): Normal sinus rhythm at 67 bpm.  No ectopy.  QTC interval 467 ms.  Recent Labs: BMP Latest Ref Rng & Units 05/23/2020 09/22/2019 09/16/2018  Glucose 65 - 99 mg/dL 102(H) 93 88  BUN 8 - 27 mg/dL _2 Creatinine 0.57 - 1.00 mg/dL 0.62 0.63 0.62  BUN/Creat Ratio 12 - _3 Sodium 134 - 144 mmol/L 143 142 141  Potassium 3.5 - 5.2 mmol/L 4.5 4.9 4.7  Chloride 96 - 106 mmol/L 103 103 101  CO2 20 - 29 mmol/L _4 Calcium 8.7 - 10.3 mg/dL 9.4 9.4 9.5     Hepatic Function Latest Ref Rng & Units 05/23/2020 09/22/2019 09/16/2018  Total Protein 6.0 - 8.5 g/dL 6.5 6.6 6.5  Albumin 3.8 - 4.8 g/dL 4.5 4.4 4.3  AST 0 - 40 IU/L  _5 ALT 0 - 32 IU/L _6 Alk Phosphatase 44 - 121 IU/L 58 64 68  Total Bilirubin 0.0 - 1.2 mg/dL <0.2 0.4 0.4  Bilirubin, Direct 0.0 - 0.2 mg/dL - - -    CBC Latest Ref Rng & Units 05/23/2020 09/22/2019 09/16/2018  WBC 3.4 - 10.8 x10E3/uL 5.7 5.5 5.8  Hemoglobin 11.1 - 15.9 g/dL 13.0 12.5 12.6  Hematocrit 34.0 - 46.6 % 37.9 35.5 36.0  Platelets 150 - 450 x10E3/uL 257 239 230   Lab Results  Component Value Date   MCV 86 05/23/2020   MCV 86 09/22/2019   MCV 85 09/16/2018   Lab Results  Component Value Date  TSH 1.350 05/23/2020   No results found for: HGBA1C   BNP No results found for: BNP  ProBNP No results found for: PROBNP   Lipid Panel     Component Value Date/Time   CHOL 222 (H) 10/13/2020 0900   TRIG 110 10/13/2020 0900   HDL 79 10/13/2020 0900   CHOLHDL 2.8 10/13/2020 0900   CHOLHDL 2.1 07/05/2016 0724   VLDL 21 07/05/2016 0724   LDLCALC 124 (H) 10/13/2020 0900     RADIOLOGY: No results found.   Additional studies/ records that were reviewed today include:  I reviewed the patient's previous sleep studies.  A new download was obtained in the office today from January 25 through August 24, 2018.  Downloads were obtained over the past 3 months.  ASSESSMENT:      PLAN:  Ms. 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  She haws a history of hypothyroidism which has been stable on levothyroxine 50 g.  She has attention deficit disorder and takes adderall XR  30 mg every morning.  .  She has had issues with sleep initiation and sleep maintenance.  On her initial sleep study she was found to have very mild sleep apnea, unspecified type and was found to have significant periodic limb movement disorder of sleep with a PLM asked index of 16.4.  Since institution of Lunesta, she has felt improved with reference to sleep initiation and  maintenance and also had noticed improvements in her periodic limb movements of sleep.  On her initial sleep study in December 2018 she did not develop any REM sleep and had mild overall sleep apnea with an AHI 5.4 and RDI of 6.9 with slight increase in sleep apnea with supine posture.  On follow-up assessment in  August 2019 overall AHI was 9.6/h with an RDI of 16.9/h.  She had moderate sleep apnea with supine sleep with an AHI of 16.9/h.  She was initiated on CPAP therapy and initially had achieved remarkable benefit with an AHI at 1.9 and average sleep duration at 7 hours and 45 minutes.  However, she developed progressive obstruction to her right nostril secondary to significant deviated septum.  She is now undergone successful surgery.  However following her surgery she has had some issues with a rash inside her nose and has undergone several different evaluations.  More recently, she also notes irritation which she states intensifies when she had tried to use a nasal pillow mask.  I have suggested a trial of the ResMed AirFit N 30i mask to see if she will be able to tolerate this since there should not be any nasal irritation or nares inflammation resulting from this mask.  She states she will give this a try.  If she is still unable to tolerate therapy, at present I am not sure her sleep apnea is severe enough to consider inspire technology which could be an alternative versus initiation of an oral appliance.  Her blood pressure today was initially mildly increased but on repeat by me was improved at 118/66.  In the past she had been on simvastatin for hyperlipidemia but this is not on her current medications.  She is followed by Dr. Rita Ohara for primary care who has been checking laboratory.  She continues to be on levothyroxine for hypothyroidism.  I will see her in 3 months for reevaluation   Medication Adjustments/Labs and Tests  Ordered: Current medicines are reviewed at length with the patient  today.  Concerns regarding medicines are outlined above.  Medication changes, Labs and Tests ordered today are listed in the Patient Instructions below. Patient Instructions  Medication Instructions:  Your physician recommends that you continue on your current medications as directed. Please refer to the Current Medication list given to you today.  *If you need a refill on your cardiac medications before your next appointment, please call your pharmacy*   Lab Work: None ordered.   Testing/Procedures: None ordered.    Follow-Up: At [facility], you and your health needs are our priority.  As part of our continuing mission to provide you with exceptional heart care, we have created designated Provider Care Teams.  These Care Teams include your primary Cardiologist (physician) and Advanced Practice Providers (APPs -  Physician Assistants and Nurse Practitioners) who all work together to provide you with the care you need, when you need it.  We recommend signing up for the patient portal called "MyChart".  Sign up information is provided on this After Visit Summary.  MyChart is used to connect with patients for Virtual Visits (Telemedicine).  Patients are able to view lab/test results, encounter notes, upcoming appointments, etc.  Non-urgent messages can be sent to your provider as well.   To learn more about what you can do with MyChart, go to [email or web address].    Your next appointment:   3 month(s)  The format for your next appointment:   In Person  Provider:   Shelva Majestic, MD       Signed, Shelva Majestic, MD  11/19/2020 3:19 PM    [facility] [address], [place], [place], [place]  [address] Phone: [phone]

## 2020-11-20 ENCOUNTER — Encounter: Payer: Self-pay | Admitting: Family Medicine

## 2020-11-21 ENCOUNTER — Other Ambulatory Visit: Payer: Self-pay | Admitting: *Deleted

## 2020-11-21 DIAGNOSIS — K219 Gastro-esophageal reflux disease without esophagitis: Secondary | ICD-10-CM

## 2020-11-21 MED ORDER — OMEPRAZOLE 40 MG PO CPDR: 40.0000 mg | DELAYED_RELEASE_CAPSULE | Freq: Every day | ORAL | 0 refills | Status: DC

## 2020-12-05 DIAGNOSIS — F419 Anxiety disorder, unspecified: Secondary | ICD-10-CM | POA: Diagnosis not present

## 2020-12-05 DIAGNOSIS — F902 Attention-deficit hyperactivity disorder, combined type: Secondary | ICD-10-CM | POA: Diagnosis not present

## 2020-12-21 DIAGNOSIS — F419 Anxiety disorder, unspecified: Secondary | ICD-10-CM | POA: Diagnosis not present

## 2020-12-21 DIAGNOSIS — F902 Attention-deficit hyperactivity disorder, combined type: Secondary | ICD-10-CM | POA: Diagnosis not present

## 2021-01-01 ENCOUNTER — Encounter: Payer: Self-pay | Admitting: Family Medicine

## 2021-01-01 ENCOUNTER — Other Ambulatory Visit: Payer: Self-pay | Admitting: Family Medicine

## 2021-01-01 DIAGNOSIS — Z9189 Other specified personal risk factors, not elsewhere classified: Secondary | ICD-10-CM

## 2021-01-12 ENCOUNTER — Encounter: Payer: Self-pay | Admitting: Family Medicine

## 2021-01-16 DIAGNOSIS — F902 Attention-deficit hyperactivity disorder, combined type: Secondary | ICD-10-CM | POA: Diagnosis not present

## 2021-01-16 DIAGNOSIS — F419 Anxiety disorder, unspecified: Secondary | ICD-10-CM | POA: Diagnosis not present

## 2021-01-18 NOTE — Progress Notes (Signed)
Start time: 9:11 End time: 9:31  Virtual Visit via Video Note  I connected with Nicole Allen on 01/19/21 by a video enabled telemedicine application and verified that I am speaking with the correct person using two identifiers.  Location: Patient: home Provider: office   I discussed the limitations of evaluation and management by telemedicine and the availability of in person appointments. The patient expressed understanding and agreed to proceed.  History of Present Illness:  Chief Complaint  Patient presents with   MRSA    VIRTUAL MRSA infection on her face.   Patient presents to discuss treatment of her rash around her nostrils. She is asking for an oral antibiotic to treat MRSA.  A number of years ago, when she could get in to see a derm, she was told it was MRSA. At that time the rash was spreading to her cheek, up toward eye. She tried calling dermatologist (Dr. Maurie Boettcher office), was told 6 months for an appointment, and is on a waiting list, but never called.  She states "Nothing has stopped it." She has tried HC ointment (rx and OTC), mupirocin. Hasn't been using anything lately. Hasn't been using CPAP for months. It gets much worse when she uses CPAP.  She spoke to Dr. Claiborne Billings about this, who suggested a different type of equipment around the nose--only had sample, which didn't fit right, unable to get due to supply chain issues. She hasn't been able to sleep at all, since not wearing CPAP and is very frustrated.  Currently she has a few bumps, is slightly itchy.  Notices a bump below the nose on the left, and a spot on the chin.  [place] the [place].  She recalls having some GI issues with sulfa drugs in the past.  Chart reviewed regarding prior discussions of this problem: We discussed this at her 09/2020 visit ([facility]), when she was trying to get in with her sleep doctor to discuss this, as the rash made it painful to use her CPAP, so she hasn't used  it for >2 months at that time. She had reported that the rash would get a little better, never completely resolve, and then would recur.  She described the rash as sore, itches, and sometimes get blisters. At that time (09/2020) exam revealed mild erythema at base of nares, at rim. There is a small dot/papule below left side of nose. No vesicles. She was prescribed 2.5% hydrocortisone cream.  She was seen for similar issue in 05/2020. At that time she used bactroban, but felt it made it more inflamed ("red and stinging").  She then used hydrocortisone, as it has been itchy, which decreased the swelling, and then the area just looked pink. At that time she had stopped using her Flonase and CPAP related to this rash (wasn't sure if the vaseline she needs to use to make the mask/nasal pillows fit correctly was contributing/worsening things).  She states she asked ENT about rash, she still had it at the time, didn't have any answers.  She stopped using everything for a while, it finally resolved, but recurred about 3 months after getting new parts for CPAP in late Fall.    PMH, PSH, SH reviewed  Outpatient Encounter Medications as of 01/19/2021  Medication Sig   amphetamine-dextroamphetamine (ADDERALL XR) 30 MG 24 hr capsule Take 1 capsule (30 mg total) by mouth every morning.   cetirizine (ZYRTEC) 10 MG tablet Take 10 mg by mouth daily.  conjugated estrogens (PREMARIN) vaginal cream Insert 1 applicator twice weekly vaginally.   eszopiclone (LUNESTA) 2 MG TABS tablet Take 1 tablet (2 mg total) by mouth at bedtime. Take immediately before bedtime   famotidine (PEPCID) 40 MG tablet Take 40 mg by mouth daily.   ibandronate (BONIVA) 150 MG tablet TAKE ONE TABLET ONCE A MONTH IN THE MORNING ON AN EMPTY STOMACH WITH WATER. REMAIN UPRIGHT.   Multiple Vitamin (MULTIVITAMIN) tablet Take 1 tablet by mouth daily.   omeprazole (PRILOSEC) 40 MG capsule Take 1 capsule (40 mg total) by mouth daily. Take before  dinner   SYNTHROID 50 MCG tablet TAKE 1 TABLET ONCE DAILY BEFORE BREAKFAST MON THRU SAT, 2 TABLETS ON SUNDAYS   TURMERIC PO Take by mouth daily.   [DISCONTINUED] Coenzyme Q10 (COQ10 PO) Take 1 tablet by mouth daily.   ALPRAZolam (XANAX) 0.25 MG tablet Take 1-2 tablets (0.25-0.5 mg total) by mouth 2 (two) times daily as needed for anxiety. (Patient not taking: Reported on 01/19/2021)   hydrocortisone 2.5 % ointment Apply topically 2 (two) times daily as needed (rash on nose). (Patient not taking: Reported on 01/19/2021)   mupirocin ointment (BACTROBAN) 2 % Place 1 application into the nose as directed. (Patient not taking: Reported on 01/19/2021)   No facility-administered encounter medications on file as of 01/19/2021.   Allergies  Allergen Reactions   Morphine And Related Other (See Comments)    Family history.   Codeine Itching and Rash   ROS: no fever, chills, URI symptoms, n/v/d.  Rash on face, no other skin concerns.  Fatigue due to not sleeping, since not using CPAP. See HPI    Observations/Objective:  Temp (!) 96.6 F (35.9 C) (Oral)   Ht 4\' 10"  (1.473 m)   LMP  (LMP Unknown)   BMI 22.15 kg/m   Well-appearing female, in no distress She is alert, oriented. Evaluation of her face-- Slight erythema at L>R nores Small papule noted inferolat to L nares, small spot on chin. No crusting, swelling.   Assessment and Plan:   MRSA infection - suspected MRSA of face.  Failed treatment with bactroban in past. Will tx with oral ABX. May use OTC HC prn itching. Derm referral if doesn't resolve - Plan: doxycycline (VIBRA-TABS) 100 MG tablet  OSA (obstructive sleep apnea) - not able to use CPAP due to rash around nose.  Understands the importance of treatment--she feels awful, hasn't been sleeping well without it   Risks/SE of meds reviewed.  Even though at beach, prefers doxy over sulfa, given her h/o GI side effects. Discussed proper sunscreen use.  Call next week if not resolved  for more urgent derm referral for evaluation.    Follow Up Instructions:    I discussed the assessment and treatment plan with the patient. The patient was provided an opportunity to ask questions and all were answered. The patient agreed with the plan and demonstrated an understanding of the instructions.   The patient was advised to call back or seek an in-person evaluation if the symptoms worsen or if the condition fails to improve as anticipated.  I spent 23 minutes dedicated to the care of this patient, including pre-visit review of records, face to face time, post-visit ordering of testing and documentation.   Vikki Ports, MD

## 2021-01-19 ENCOUNTER — Encounter: Payer: Self-pay | Admitting: Family Medicine

## 2021-01-19 ENCOUNTER — Other Ambulatory Visit: Payer: Self-pay

## 2021-01-19 ENCOUNTER — Telehealth (INDEPENDENT_AMBULATORY_CARE_PROVIDER_SITE_OTHER): Payer: Medicare PPO | Admitting: Family Medicine

## 2021-01-19 VITALS — Temp 96.6°F | Ht <= 58 in

## 2021-01-19 DIAGNOSIS — A4902 Methicillin resistant Staphylococcus aureus infection, unspecified site: Secondary | ICD-10-CM | POA: Diagnosis not present

## 2021-01-19 DIAGNOSIS — G4733 Obstructive sleep apnea (adult) (pediatric): Secondary | ICD-10-CM | POA: Diagnosis not present

## 2021-01-19 MED ORDER — DOXYCYCLINE HYCLATE 100 MG PO TABS: 100.0000 mg | ORAL_TABLET | Freq: Two times a day (BID) | ORAL | 0 refills | Status: DC

## 2021-01-19 NOTE — Patient Instructions (Signed)
Take the antibiotic twice daily. Be sure to use sunscreen to avoid sun sensitivity reactions that can occur with this medication.  Call next week if not resolved for more urgent derm referral for evaluation.  You may use hydrocortisone if needed for any itching

## 2021-01-25 ENCOUNTER — Encounter (HOSPITAL_BASED_OUTPATIENT_CLINIC_OR_DEPARTMENT_OTHER): Payer: Self-pay | Admitting: Obstetrics & Gynecology

## 2021-01-25 ENCOUNTER — Other Ambulatory Visit: Payer: Self-pay

## 2021-01-25 ENCOUNTER — Ambulatory Visit (HOSPITAL_BASED_OUTPATIENT_CLINIC_OR_DEPARTMENT_OTHER): Payer: Medicare PPO | Admitting: Obstetrics & Gynecology

## 2021-01-25 VITALS — BP 130/57 | HR 75 | Ht 58.5 in | Wt 109.8 lb

## 2021-01-25 DIAGNOSIS — N9489 Other specified conditions associated with female genital organs and menstrual cycle: Secondary | ICD-10-CM

## 2021-01-25 DIAGNOSIS — N83201 Unspecified ovarian cyst, right side: Secondary | ICD-10-CM | POA: Diagnosis not present

## 2021-01-25 DIAGNOSIS — Z9851 Tubal ligation status: Secondary | ICD-10-CM | POA: Diagnosis not present

## 2021-01-25 DIAGNOSIS — R9389 Abnormal findings on diagnostic imaging of other specified body structures: Secondary | ICD-10-CM

## 2021-01-25 DIAGNOSIS — Z98891 History of uterine scar from previous surgery: Secondary | ICD-10-CM

## 2021-01-25 NOTE — Progress Notes (Signed)
70 y.o. G2P2 Married White or Caucasian female here for new patient visit and second opinion regarding her recent GYN surgical recommendations.  Patient underwent a pelvic ultrasound in December due to abdominal bloating.  Ultrasound showed the uterus to measure 5.4 x 3.8 x 2.6 cm.  There was a thickened endometrial lining with echogenic focus and cystic spaces suggestive of an endometrial polyp.  The endometrial lining thickness was 6.07 mm.  Ovaries were atrophic and the right ovary contained a 9 mm simple residual avascular cyst.    Patient then underwent sonohysterogram in March of this year which showed the endometrial thickness to be 4.5 mm with a 9 x 10 mm echogenic focus which was consistent with polyp the same avascular cyst was noted on the right ovary to measure 1.1 cm.  I have personally reviewed all these images and reviewed them with the patient as well.  My interpretation of the right ovarian finding is that this is more likely a paratubal cyst as it appears to be sitting beside the ovary itself coming off the ovary.    Due to the ultrasound findings of a hysteroscopy with polyp resection, D&C would be recommended.  The procedure was reviewed with the patient.  Pre and postoperative course reviewed.  Recovery and pain management discussed.  Risks discussed including but not limited to bleeding, rare risk of transfusion, infection, 1% risk of uterine perforation with subsequent risks as well as possibly needing to stop procedure stop procedure early.  Fluid emboli and rare risk of death discussed.  DVT/PE, rare risk of risk of bowel/bladder/ureteral/vascular injury.  Patient aware if pathology abnormal she may need additional treatment but I suspect this appears to be a benign endometrial polyp.  All questions answered.    Pt desired second opinion as she did not feel she got enough information prior to having the sononhysterogram and, therefore, questioned the recommendations.  Just did not feel  educated enough to make decision.  She is comfortable with the recommendations and ready to proceed with scheduling.   She had not undergone an endometrial biopsy but I will have pathology with this procedure so feel it is not necessary to do a biopsy.  Pt comfortable with this plan.  No LMP recorded (lmp unknown). Patient is postmenopausal.           Health Maintenance: Pap:  07/09/2018 negative MMG:  09/17/2019 Colonoscopy:  12/19/2011   reports that she has never smoked. She has never used smokeless tobacco. She reports current alcohol use. She reports that she does not use drugs.  Past Medical History:  Diagnosis Date   ADD (attention deficit disorder)    Allergy    SEASONAL   Anxiety    BCC (basal cell carcinoma of skin)    chest; Dr. Tonia Brooms   [facility] (basal cell carcinoma)    Tonia Brooms   Cervical spondylosis    GERD (gastroesophageal reflux disease)    Hearing loss    high frequency (Dr. Prescott Parma); hearing aids recommended; bilateral   History of kidney stones    x3   Hypothyroidism 2015   Insomnia    worse with menopause   Interstitial keratitis 2021   under care of ophtho   Kidney stone    x3, Dr. Reece Agar   Osteopenia    PONV (postoperative nausea and vomiting)    Postmenopausal    on HRT--sees GYN   Pure hypercholesterolemia    Sleep apnea    uses CPAP    Past Surgical  History:  Procedure Laterality Date   CATARACT EXTRACTION Bilateral L 10/18/15 R 12/13/15   Dr. Hollice Espy SECTION  [id number]   COLONOSCOPY  2003, 2013   Dr. Collene Mares   HYMENECTOMY  1993   KNEE SURGERY Right 09/29/2014   Dr. Jim Desanctis replacement/reconstruction   NASAL SEPTOPLASTY W/ TURBINOPLASTY Bilateral 11/11/2019   Procedure: NASAL SEPTOPLASTY WITH TURBINATE REDUCTION;  Surgeon: Jerrell Belfast, MD;  Location: [place];  Service: ENT;  Laterality: Bilateral;   [place]   with second cesarean    Current Outpatient  Medications  Medication Sig Dispense Refill   amphetamine-dextroamphetamine (ADDERALL XR) 30 MG 24 hr capsule Take 1 capsule (30 mg total) by mouth every morning. 90 capsule 0   cetirizine (ZYRTEC) 10 MG tablet Take 10 mg by mouth daily.     conjugated estrogens (PREMARIN) vaginal cream Insert 1 applicator twice weekly vaginally. 42.5 g 4   doxycycline (VIBRA-TABS) 100 MG tablet Take 1 tablet (100 mg total) by mouth 2 (two) times daily. 20 tablet 0   eszopiclone (LUNESTA) 2 MG TABS tablet Take 1 tablet (2 mg total) by mouth at bedtime. Take immediately before bedtime 90 tablet 0   famotidine (PEPCID) 40 MG tablet Take 40 mg by mouth daily.     ibandronate (BONIVA) 150 MG tablet TAKE ONE TABLET ONCE A MONTH IN THE MORNING ON AN EMPTY STOMACH WITH WATER. REMAIN UPRIGHT. 3 tablet 4   Multiple Vitamin (MULTIVITAMIN) tablet Take 1 tablet by mouth daily.     omeprazole (PRILOSEC) 40 MG capsule Take 1 capsule (40 mg total) by mouth daily. Take before dinner 90 capsule 0   SYNTHROID 50 MCG tablet TAKE 1 TABLET ONCE DAILY BEFORE BREAKFAST MON THRU SAT, 2 TABLETS ON 'SUNDAYS 102 tablet 1   TURMERIC PO Take by mouth daily.     ALPRAZolam (XANAX) 0.25 MG tablet Take 1-2 tablets (0.25-0.5 mg total) by mouth 2 (two) times daily as needed for anxiety. (Patient not taking: No sig reported) 15 tablet 0   hydrocortisone 2.5 % ointment Apply topically 2 (two) times daily as needed (rash on nose). (Patient not taking: No sig reported) 30 g 0   mupirocin ointment (BACTROBAN) 2 % Place 1 application into the nose as directed. (Patient not taking: No sig reported)     No current facility-administered medications for this visit.    Family History  Problem Relation Age of Onset   Asthma Mother    Arthritis Mother    Hypertension Mother    Hyperlipidemia Mother    Osteoporosis Mother    Hypertension Father    Heart disease Father        70'$ 's   Hyperlipidemia Father    ADD / ADHD Daughter    ADD / ADHD Son     Sleep apnea Sister    Alzheimer's disease Maternal Grandmother    Cancer Maternal Grandfather        lung   Cancer Paternal Grandfather        lung    Review of Systems  Constitutional: Negative.   Gastrointestinal: Negative.   Genitourinary: Negative.    Exam:   BP (!) 130/57   Pulse 75   Ht 4' 10.5" (1.486 m)   Wt 109 lb 12.8 oz (49.8 kg)   LMP  (LMP Unknown)   BMI 22.56 kg/m   Height: 4' 10.5" (148.6 cm)  Physical Exam Constitutional:  Appearance: Normal appearance.  HENT:     Head: Normocephalic and atraumatic.  Cardiovascular:     Rate and Rhythm: Normal rate and regular rhythm.     Heart sounds: Normal heart sounds.  Pulmonary:     Effort: Pulmonary effort is normal.     Breath sounds: Normal breath sounds.  Neurological:     General: No focal deficit present.     Mental Status: She is alert.  Psychiatric:        Mood and Affect: Mood normal.   Assessment/Plan: 1. Endometrial mass -Hysteroscopy with polyp resection, D&C will be planned.  Information has been sent to surgical scheduling.  Patient aware preoperative nurse will call prior to the surgery as well with additional instructions.  Preoperative COVID testing discussed.  All questions answered.  2. Thickened endometrium  3. Right ovarian cyst -I do not recommend any ultrasound follow-up for this at this time.  4. History of C-section  5. History of tubal ligation  Total time with pt and documentation:  35 minutes

## 2021-01-26 ENCOUNTER — Encounter: Payer: Self-pay | Admitting: Family Medicine

## 2021-01-26 ENCOUNTER — Encounter (HOSPITAL_BASED_OUTPATIENT_CLINIC_OR_DEPARTMENT_OTHER): Payer: Self-pay | Admitting: Obstetrics & Gynecology

## 2021-01-26 DIAGNOSIS — F988 Other specified behavioral and emotional disorders with onset usually occurring in childhood and adolescence: Secondary | ICD-10-CM

## 2021-01-26 DIAGNOSIS — F419 Anxiety disorder, unspecified: Secondary | ICD-10-CM | POA: Diagnosis not present

## 2021-01-26 DIAGNOSIS — F902 Attention-deficit hyperactivity disorder, combined type: Secondary | ICD-10-CM | POA: Diagnosis not present

## 2021-01-27 MED ORDER — ESZOPICLONE 3 MG PO TABS: 3.0000 mg | ORAL_TABLET | Freq: Every day | ORAL | 0 refills | Status: DC

## 2021-01-31 DIAGNOSIS — H524 Presbyopia: Secondary | ICD-10-CM | POA: Diagnosis not present

## 2021-01-31 DIAGNOSIS — Z9849 Cataract extraction status, unspecified eye: Secondary | ICD-10-CM | POA: Diagnosis not present

## 2021-01-31 DIAGNOSIS — H5212 Myopia, left eye: Secondary | ICD-10-CM | POA: Diagnosis not present

## 2021-01-31 DIAGNOSIS — H52221 Regular astigmatism, right eye: Secondary | ICD-10-CM | POA: Diagnosis not present

## 2021-02-01 DIAGNOSIS — F902 Attention-deficit hyperactivity disorder, combined type: Secondary | ICD-10-CM | POA: Diagnosis not present

## 2021-02-01 DIAGNOSIS — F419 Anxiety disorder, unspecified: Secondary | ICD-10-CM | POA: Diagnosis not present

## 2021-02-02 ENCOUNTER — Encounter (HOSPITAL_BASED_OUTPATIENT_CLINIC_OR_DEPARTMENT_OTHER): Payer: Self-pay

## 2021-02-02 ENCOUNTER — Encounter (HOSPITAL_BASED_OUTPATIENT_CLINIC_OR_DEPARTMENT_OTHER): Payer: Self-pay | Admitting: *Deleted

## 2021-02-02 NOTE — Telephone Encounter (Signed)
Call to patient. Female states she is unavailable all afternoon. Left number to call back. ROI  allows for discussion with Louie Casa- spouse. Female confirmed name- date options reviewed. Per Louie Casa, plan for 02-28-21 as she is limited availability other than this date. Patient to call back tomorrow.

## 2021-02-02 NOTE — Telephone Encounter (Signed)
Surgery scheduled, letter sent with hospital brochure and message sent to patient.   Encounter closed.

## 2021-02-07 MED ORDER — AMPHETAMINE-DEXTROAMPHET ER 30 MG PO CP24: 30.0000 mg | ORAL_CAPSULE | ORAL | 0 refills | Status: DC

## 2021-02-07 NOTE — Addendum Note (Signed)
Addended byRita Ohara on: 02/07/2021 10:17 AM   Modules accepted: Orders

## 2021-02-09 ENCOUNTER — Other Ambulatory Visit (HOSPITAL_BASED_OUTPATIENT_CLINIC_OR_DEPARTMENT_OTHER): Payer: Self-pay | Admitting: Obstetrics & Gynecology

## 2021-02-17 DIAGNOSIS — G4733 Obstructive sleep apnea (adult) (pediatric): Secondary | ICD-10-CM | POA: Diagnosis not present

## 2021-02-20 ENCOUNTER — Other Ambulatory Visit: Payer: Self-pay | Admitting: *Deleted

## 2021-02-20 ENCOUNTER — Encounter: Payer: Self-pay | Admitting: Family Medicine

## 2021-02-20 DIAGNOSIS — K219 Gastro-esophageal reflux disease without esophagitis: Secondary | ICD-10-CM

## 2021-02-20 DIAGNOSIS — F902 Attention-deficit hyperactivity disorder, combined type: Secondary | ICD-10-CM | POA: Diagnosis not present

## 2021-02-20 DIAGNOSIS — F419 Anxiety disorder, unspecified: Secondary | ICD-10-CM | POA: Diagnosis not present

## 2021-02-20 MED ORDER — OMEPRAZOLE 40 MG PO CPDR
40.0000 mg | DELAYED_RELEASE_CAPSULE | Freq: Every day | ORAL | 0 refills | Status: DC
Start: 2021-02-20 — End: 2021-05-19

## 2021-02-21 DIAGNOSIS — F902 Attention-deficit hyperactivity disorder, combined type: Secondary | ICD-10-CM | POA: Diagnosis not present

## 2021-02-21 DIAGNOSIS — F411 Generalized anxiety disorder: Secondary | ICD-10-CM | POA: Diagnosis not present

## 2021-02-27 ENCOUNTER — Telehealth: Payer: Self-pay | Admitting: *Deleted
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
## 2021-02-28 ENCOUNTER — Ambulatory Visit (HOSPITAL_COMMUNITY): Admission: RE | Admit: 2021-02-28 | Payer: Medicare PPO | Source: Home / Self Care | Admitting: Obstetrics & Gynecology

## 2021-02-28 ENCOUNTER — Encounter (HOSPITAL_COMMUNITY): Admission: RE | Payer: Self-pay | Source: Home / Self Care

## 2021-02-28 SURGERY — DILATATION & CURETTAGE/HYSTEROSCOPY WITH MYOSURE
Anesthesia: Choice

## 2021-03-21 DIAGNOSIS — F902 Attention-deficit hyperactivity disorder, combined type: Secondary | ICD-10-CM | POA: Diagnosis not present

## 2021-03-21 DIAGNOSIS — F411 Generalized anxiety disorder: Secondary | ICD-10-CM | POA: Diagnosis not present

## 2021-03-23 NOTE — Telephone Encounter (Signed)
Patient had this surgery performed by Dr. Hale Bogus 02/28/21.

## 2021-03-30 ENCOUNTER — Telehealth: Payer: Self-pay | Admitting: *Deleted

## 2021-03-30 NOTE — Telephone Encounter (Signed)
Voice mail from patient requesting to reschedule surgery she had to cancel. Call back to patient. Left message that 10-18 is first available and can discuss other options.  Request call back to [phone].

## 2021-04-04 NOTE — Telephone Encounter (Signed)
Call to patient. Left message returning her call to reschedule surgery. Left message 04-18-21 is available if desires.

## 2021-04-07 ENCOUNTER — Telehealth: Payer: Self-pay

## 2021-04-07 MED ORDER — FLUTICASONE PROPIONATE 50 MCG/ACT NA SUSP: 1.0000 | Freq: Every evening | NASAL | 0 refills | Status: DC

## 2021-04-07 NOTE — Telephone Encounter (Signed)
Rx refilled.

## 2021-04-07 NOTE — Telephone Encounter (Signed)
Pt called and needs refills Flonase to [facility] said her refills were expired

## 2021-04-11 NOTE — Telephone Encounter (Signed)
Third attempt to contact patient for rescheduling. Left message to call back.

## 2021-04-25 ENCOUNTER — Other Ambulatory Visit: Payer: Self-pay

## 2021-04-25 ENCOUNTER — Other Ambulatory Visit: Payer: Medicare PPO

## 2021-04-25 DIAGNOSIS — E7800 Pure hypercholesterolemia, unspecified: Secondary | ICD-10-CM | POA: Diagnosis not present

## 2021-04-25 DIAGNOSIS — E039 Hypothyroidism, unspecified: Secondary | ICD-10-CM | POA: Diagnosis not present

## 2021-04-25 DIAGNOSIS — Z5181 Encounter for therapeutic drug level monitoring: Secondary | ICD-10-CM | POA: Diagnosis not present

## 2021-04-26 LAB — CBC WITH DIFFERENTIAL/PLATELET
Basophils Absolute: 0.1 10*3/uL (ref 0.0–0.2)
Basos: 2 %
EOS (ABSOLUTE): 0.2 10*3/uL (ref 0.0–0.4)
Eos: 4 %
Hematocrit: 36 % (ref 34.0–46.6)
Hemoglobin: 12.5 g/dL (ref 11.1–15.9)
Immature Grans (Abs): 0 10*3/uL (ref 0.0–0.1)
Immature Granulocytes: 0 %
Lymphocytes Absolute: 1.9 10*3/uL (ref 0.7–3.1)
Lymphs: 35 %
MCH: 30.2 pg (ref 26.6–33.0)
MCHC: 34.7 g/dL (ref 31.5–35.7)
MCV: 87 fL (ref 79–97)
Monocytes Absolute: 0.6 10*3/uL (ref 0.1–0.9)
Monocytes: 10 %
Neutrophils Absolute: 2.6 10*3/uL (ref 1.4–7.0)
Neutrophils: 49 %
Platelets: 235 10*3/uL (ref 150–450)
RBC: 4.14 x10E6/uL (ref 3.77–5.28)
RDW: 13.2 % (ref 11.7–15.4)
WBC: 5.4 10*3/uL (ref 3.4–10.8)

## 2021-04-26 LAB — TSH: TSH: 1.96 u[IU]/mL (ref 0.450–4.500)

## 2021-04-26 LAB — COMPREHENSIVE METABOLIC PANEL
ALT: 9 IU/L (ref 0–32)
AST: 20 IU/L (ref 0–40)
Albumin/Globulin Ratio: 2.2 (ref 1.2–2.2)
Albumin: 4.2 g/dL (ref 3.8–4.8)
Alkaline Phosphatase: 56 IU/L (ref 44–121)
BUN/Creatinine Ratio: 17 (ref 12–28)
BUN: 12 mg/dL (ref 8–27)
Bilirubin Total: 0.5 mg/dL (ref 0.0–1.2)
CO2: 25 mmol/L (ref 20–29)
Calcium: 9.3 mg/dL (ref 8.7–10.3)
Chloride: 102 mmol/L (ref 96–106)
Creatinine, Ser: 0.69 mg/dL (ref 0.57–1.00)
Globulin, Total: 1.9 g/dL (ref 1.5–4.5)
Glucose: 87 mg/dL (ref 70–99)
Potassium: 4.5 mmol/L (ref 3.5–5.2)
Sodium: 140 mmol/L (ref 134–144)
Total Protein: 6.1 g/dL (ref 6.0–8.5)
eGFR: 93 mL/min/{1.73_m2} (ref >59)

## 2021-04-26 LAB — LIPID PANEL
Chol/HDL Ratio: 4.1 ratio (ref 0.0–4.4)
Cholesterol, Total: 300 mg/dL — ABNORMAL HIGH (ref 100–199)
HDL: 73 mg/dL (ref >39)
LDL Chol Calc (NIH): 206 mg/dL — ABNORMAL HIGH (ref 0–99)
Triglycerides: 121 mg/dL (ref 0–149)
VLDL Cholesterol Cal: 21 mg/dL (ref 5–40)
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
## 2021-04-27 ENCOUNTER — Ambulatory Visit: Payer: Medicare PPO | Admitting: Family Medicine

## 2021-04-27 ENCOUNTER — Other Ambulatory Visit: Payer: Self-pay

## 2021-04-27 ENCOUNTER — Encounter: Payer: Self-pay | Admitting: Family Medicine

## 2021-04-27 VITALS — BP 102/60 | HR 71 | Ht 58.5 in | Wt 107.2 lb

## 2021-04-27 DIAGNOSIS — N840 Polyp of corpus uteri: Secondary | ICD-10-CM

## 2021-04-27 DIAGNOSIS — Z5181 Encounter for therapeutic drug level monitoring: Secondary | ICD-10-CM

## 2021-04-27 DIAGNOSIS — F988 Other specified behavioral and emotional disorders with onset usually occurring in childhood and adolescence: Secondary | ICD-10-CM

## 2021-04-27 DIAGNOSIS — E7800 Pure hypercholesterolemia, unspecified: Secondary | ICD-10-CM | POA: Diagnosis not present

## 2021-04-27 DIAGNOSIS — E039 Hypothyroidism, unspecified: Secondary | ICD-10-CM

## 2021-04-27 DIAGNOSIS — F411 Generalized anxiety disorder: Secondary | ICD-10-CM

## 2021-04-27 DIAGNOSIS — K219 Gastro-esophageal reflux disease without esophagitis: Secondary | ICD-10-CM | POA: Diagnosis not present

## 2021-04-27 DIAGNOSIS — G4733 Obstructive sleep apnea (adult) (pediatric): Secondary | ICD-10-CM

## 2021-04-27 MED ORDER — SIMVASTATIN 10 MG PO TABS: 10.0000 mg | ORAL_TABLET | Freq: Every day | ORAL | 3 refills | Status: DC

## 2021-04-27 MED ORDER — SYNTHROID 50 MCG PO TABS: ORAL_TABLET | ORAL | 3 refills | Status: DC

## 2021-04-27 NOTE — Patient Instructions (Addendum)
Restart your daily multivitamin. Continue current dose of Synthroid. Restart the 10mg  of simvastatin.  We will be checking your cholester and liver tests prior to your physical in April.  Other labs aren't needed, as they were all normal this time. HOWEVER, if you are feeling poorly, please contact us PRIOR to your labs and let us know what is going on, so that we can adjust which labs may be indicated.  I will be sending in the Adderall refill this evening. If you find that your ADHD is not adequately controlled, and you are discussing this with Dr. Albertine Patricia and change are being made, please let me know, and I will defer the treatment to her.

## 2021-04-29 MED ORDER — AMPHETAMINE-DEXTROAMPHET ER 30 MG PO CP24: 30.0000 mg | ORAL_CAPSULE | ORAL | 0 refills | Status: DC

## 2021-05-19 ENCOUNTER — Encounter (HOSPITAL_BASED_OUTPATIENT_CLINIC_OR_DEPARTMENT_OTHER): Payer: Self-pay | Admitting: Obstetrics & Gynecology

## 2021-05-19 ENCOUNTER — Encounter: Payer: Self-pay | Admitting: Family Medicine

## 2021-05-19 ENCOUNTER — Other Ambulatory Visit: Payer: Self-pay | Admitting: Family Medicine

## 2021-05-19 DIAGNOSIS — K219 Gastro-esophageal reflux disease without esophagitis: Secondary | ICD-10-CM

## 2021-05-19 MED ORDER — FLUTICASONE PROPIONATE 50 MCG/ACT NA SUSP: 1.0000 | Freq: Every evening | NASAL | 0 refills | Status: DC

## 2021-05-19 MED ORDER — OMEPRAZOLE 40 MG PO CPDR
40.0000 mg | DELAYED_RELEASE_CAPSULE | Freq: Every day | ORAL | 0 refills | Status: DC
Start: 2021-05-19 — End: 2021-08-21

## 2021-05-19 NOTE — Telephone Encounter (Signed)
Pt wants to know if you will fill famotidine for her

## 2021-05-20 MED ORDER — FAMOTIDINE 40 MG PO TABS: 40.0000 mg | ORAL_TABLET | Freq: Every day | ORAL | 2 refills | Status: DC

## 2021-05-20 NOTE — Telephone Encounter (Signed)
Chart reviewed.  Last saw Dr. Collene Mares in 04/2020, so refills have expired.  We discussed this at recent visit, and she is doing well.  I will send in 9 months of meds.  She will be due for a colonoscopy with Dr. Collene Mares in 11/2021 (per her last note).

## 2021-05-31 ENCOUNTER — Encounter (HOSPITAL_BASED_OUTPATIENT_CLINIC_OR_DEPARTMENT_OTHER): Payer: Self-pay

## 2021-06-07 ENCOUNTER — Telehealth (INDEPENDENT_AMBULATORY_CARE_PROVIDER_SITE_OTHER): Payer: Medicare PPO | Admitting: Family Medicine

## 2021-06-07 ENCOUNTER — Encounter: Payer: Self-pay | Admitting: Family Medicine

## 2021-06-07 ENCOUNTER — Encounter (HOSPITAL_BASED_OUTPATIENT_CLINIC_OR_DEPARTMENT_OTHER): Payer: Self-pay | Admitting: Obstetrics & Gynecology

## 2021-06-07 VITALS — Temp 97.7°F | Ht 58.5 in | Wt 108.6 lb

## 2021-06-07 DIAGNOSIS — R058 Other specified cough: Secondary | ICD-10-CM

## 2021-06-07 MED ORDER — PROMETHAZINE-CODEINE 6.25-10 MG/5ML PO SYRP: 5.0000 mL | ORAL_SOLUTION | Freq: Four times a day (QID) | ORAL | 0 refills | Status: DC | PRN

## 2021-06-07 MED ORDER — AZITHROMYCIN 250 MG PO TABS: ORAL_TABLET | ORAL | 0 refills | Status: DC

## 2021-06-07 NOTE — Patient Instructions (Signed)
Please repeat your COVID test prior to your family coming--your negative test was done too early and could miss it. There are lots of other viruses that could also be causing your symptoms (one of which is RSV, as well as flu and other viruses). Given the rapid onset of your symptoms, I truly suspect a viral infection and not bacterial.  Typical course of virus is to feel your worst for the first 3-5 days, start to improve over 5-7 days, but can take up to 2 weeks to feel completely better.  You wanted the antibiotic, so it was prescribed. Know that if this is viral, you will still be contagious to your family when they come--the antibiotic only helps with bacterial infections.  I encourage you to drink plenty of water, and to take Mucinex (plain or DM, twice daily).  Remember not to drive while taking codeine, not to take lunesta along with it (especially since you need to take the benadryl along with it to prevent the itching)--all of this is too sedating to take together. Use the cough syrup sparingly.  Follow up if your symptoms persist or worsen, or if ne symptoms develop (fever, pain with breathing, shortness of breath, etc).  I hope you feel better soon!

## 2021-06-07 NOTE — Progress Notes (Signed)
Start time: 2:38 End time: 3pm  Virtual Visit via Video Note  I connected with Nicole Allen on 06/07/21 by a video enabled telemedicine application and verified that I am speaking with the correct person using two identifiers.  Location: Patient: home Provider: office   I discussed the limitations of evaluation and management by telemedicine and the availability of in person appointments. The patient expressed understanding and agreed to proceed.  History of Present Illness:  Chief Complaint  Patient presents with   Facial Pain    VIRTUAL sinus drainage, green/bloody mucus. ST. Now she is coughing like crazy-cannot sleep. Symptoms started Monday at lunch with a sore throat, thought maybe it was allergies. Yesterday became worse and started coughing. No fever.    2 days ago she started with sore throat.  Yesterday she noticed more head congestion.  Sore throat was a little better. Started coughing.  Couldn't sleep due to the cough. Some chest tightness.  Blowing out green mucus, rare blood tinge.  Cough is mainly nonproductive, sometimes sees yellow.  Negative COVID test last night. Needed to take a lot of ibuprofen, and alternate with tylenol for her throat pain. She is concerned because her grandchildren are coming on Friday.  "This is how my sinus infections always start." She states she gets one every year, antibiotics always work, and nothing works for her cough except for codeine syrup.  She states tussionex didn't work--has a bottle from May (when she had COVID), still has a whole bottle, didn't work. Rx's reviewed--hycodan in 2019. I do not see any codeine prescriptions.  I see one rx for phenergan-DM. Pt insists that codeine is the only thing that works, and she takes it with 2 benadryl and is fine (prevents itching; listed as allergy)  PMH, PSH, SH reviewed  Outpatient Encounter Medications as of 06/07/2021  Medication Sig Note   amphetamine-dextroamphetamine  (ADDERALL XR) 30 MG 24 hr capsule Take 1 capsule (30 mg total) by mouth every morning.    azithromycin (ZITHROMAX) 250 MG tablet Take 2 tablets by mouth on first day, then 1 tablet by mouth on days 2 through 5    cetirizine (ZYRTEC) 10 MG tablet Take 10 mg by mouth daily.    conjugated estrogens (PREMARIN) vaginal cream Insert 1 applicator twice weekly vaginally. (Patient taking differently: Place 1 Applicatorful vaginally 2 (two) times a week. Insert 1 applicator twice weekly vaginally.)    eszopiclone 3 MG TABS Take 1 tablet (3 mg total) by mouth at bedtime. Take immediately before bedtime    famotidine (PEPCID) 40 MG tablet Take 1 tablet (40 mg total) by mouth daily.    fluticasone (FLONASE) 50 MCG/ACT nasal spray Place 1 spray into both nostrils every evening. Place 1 spray into both nostrils every evening.    ibandronate (BONIVA) 150 MG tablet TAKE ONE TABLET ONCE A MONTH IN THE MORNING ON AN EMPTY STOMACH WITH WATER. REMAIN UPRIGHT.    Multiple Vitamin (MULTIVITAMIN) tablet Take 1 tablet by mouth daily. 04/27/2021: Hasnt taken since over summer   omeprazole (PRILOSEC) 40 MG capsule Take 1 capsule (40 mg total) by mouth daily. Take before dinner 06/07/2021: Will take tonight   Probiotic Product (ALIGN) 4 MG CAPS Take 4 mg by mouth daily.    promethazine-codeine (PHENERGAN WITH CODEINE) 6.25-10 MG/5ML syrup Take 5 mLs by mouth every 6 (six) hours as needed for cough.    simvastatin (ZOCOR) 10 MG tablet Take 1 tablet (10 mg total) by mouth at bedtime.  SYNTHROID 50 MCG tablet TAKE 1 TABLET ONCE DAILY BEFORE BREAKFAST MON THRU SAT, 2 TABLETS ON SUNDAYS    TURMERIC PO Take 1 capsule by mouth daily.    ALPRAZolam (XANAX) 0.25 MG tablet Take 1-2 tablets (0.25-0.5 mg total) by mouth 2 (two) times daily as needed for anxiety. (Patient not taking: Reported on 01/19/2021) 04/27/2021: Has never used   hydrocortisone 2.5 % ointment Apply topically 2 (two) times daily as needed (rash on nose). (Patient not  taking: Reported on 01/19/2021)    [DISCONTINUED] busPIRone (BUSPAR) 5 MG tablet Take 5 mg by mouth daily. 04/27/2021: Started this week from Dr. Albertine Patricia with psychiatry taking 1/2 tablet per day currently and will slowly increase with instructions from psychiatry.   [DISCONTINUED] escitalopram (LEXAPRO) 10 MG tablet Take 5 mg by mouth daily.    No facility-administered encounter medications on file as of 06/07/2021.   Not taking zpak or phenergan with codeine prior to today.  Allergies  Allergen Reactions   Morphine And Related Other (See Comments)    Family history.   Codeine Itching and Rash   ROS: Denies sinus pain, body aches. ST and cough per HPI. No fever or chills. No significant HA. No n/v/d, urinary complaints. No rashes or bruising.    Observations/Objective:  Temp 97.7 F (36.5 C) (Temporal)   Ht 4' 10.5" (1.486 m)   Wt 108 lb 9.6 oz (49.3 kg)   LMP  (LMP Unknown)   BMI 22.31 kg/m   Patient is alert and oriented. She is coughing frequently during the visit. She is in no distress, speaking easily between coughing spells. Video signal is poor with some freezing of picture. Exam is limited due to virtual nature of the visit.  Assessment and Plan:  Cough productive of purulent sputum - Suspect viral; supportive measures reviewed. Rec repeat COVID test, declines flu test. Pt insistent on ABX, zpak rx'd. - Plan: promethazine-codeine (PHENERGAN WITH CODEINE) 6.25-10 MG/5ML syrup, azithromycin (ZITHROMAX) 250 MG tablet  Discussed viral vs bacterial infections, and suspect this is viral--she likely will be better in 5-7d whether or not she takes the ABX.  Discussed that if her illness is viral, she will still be contagious to her grandchildren, even if she starts the antibiotics today.  She states antibiotics always help.  Chart reviewed--hasn't actually needed ABX in a while.  Risks/SE of cough syrup reviewed, reviewed her codeine allergy, and prior rx's for hydrocodone (and  not codeine), but insists she has had this and this is the only thing that works, so rx for phenergan with codeine was provided.  She states she will take with 2 benadryl, as this prevents the itching she had in the past.  Discussed risks associated with using this in conjunction with other sedating meds, not to take with lunesta, not to drive while taking.  Advised pt to take Mucinex (plain or DM)  Zpak Repeat COVID before they come--tomorrow  Follow Up Instructions:    I discussed the assessment and treatment plan with the patient. The patient was provided an opportunity to ask questions and all were answered. The patient agreed with the plan and demonstrated an understanding of the instructions.   The patient was advised to call back or seek an in-person evaluation if the symptoms worsen or if the condition fails to improve as anticipated.    Vikki Ports, MD

## 2021-06-19 ENCOUNTER — Encounter (HOSPITAL_BASED_OUTPATIENT_CLINIC_OR_DEPARTMENT_OTHER): Payer: Self-pay

## 2021-06-20 ENCOUNTER — Encounter (HOSPITAL_BASED_OUTPATIENT_CLINIC_OR_DEPARTMENT_OTHER): Payer: Self-pay | Admitting: Obstetrics & Gynecology

## 2021-06-20 ENCOUNTER — Other Ambulatory Visit (HOSPITAL_BASED_OUTPATIENT_CLINIC_OR_DEPARTMENT_OTHER): Payer: Self-pay | Admitting: Obstetrics & Gynecology

## 2021-06-20 DIAGNOSIS — R9389 Abnormal findings on diagnostic imaging of other specified body structures: Secondary | ICD-10-CM

## 2021-06-20 DIAGNOSIS — N9489 Other specified conditions associated with female genital organs and menstrual cycle: Secondary | ICD-10-CM

## 2021-06-23 ENCOUNTER — Other Ambulatory Visit (HOSPITAL_BASED_OUTPATIENT_CLINIC_OR_DEPARTMENT_OTHER): Payer: Self-pay | Admitting: Obstetrics & Gynecology

## 2021-07-04 ENCOUNTER — Encounter (HOSPITAL_BASED_OUTPATIENT_CLINIC_OR_DEPARTMENT_OTHER): Payer: Self-pay | Admitting: *Deleted

## 2021-07-19 ENCOUNTER — Telehealth: Payer: Self-pay

## 2021-07-19 ENCOUNTER — Encounter (HOSPITAL_BASED_OUTPATIENT_CLINIC_OR_DEPARTMENT_OTHER): Payer: Self-pay | Admitting: *Deleted

## 2021-07-19 ENCOUNTER — Telehealth (HOSPITAL_BASED_OUTPATIENT_CLINIC_OR_DEPARTMENT_OTHER): Payer: Self-pay | Admitting: *Deleted

## 2021-07-19 NOTE — Telephone Encounter (Signed)
Pt left message with questions about the date of her upcoming surgery. Attempted to call pt back. DPR reviewed. LMOVM that surgery is scheduled on 08/09/21. Advised that she would need a preop appt.

## 2021-07-19 NOTE — Telephone Encounter (Signed)
Called patient to discuss reschedule surgery dates, no answer, will schedule on Dr. Ammie Ferrier next available 2/28

## 2021-07-24 DIAGNOSIS — F411 Generalized anxiety disorder: Secondary | ICD-10-CM | POA: Diagnosis not present

## 2021-07-24 DIAGNOSIS — F902 Attention-deficit hyperactivity disorder, combined type: Secondary | ICD-10-CM | POA: Diagnosis not present

## 2021-07-28 ENCOUNTER — Other Ambulatory Visit (HOSPITAL_BASED_OUTPATIENT_CLINIC_OR_DEPARTMENT_OTHER): Payer: Self-pay | Admitting: Obstetrics & Gynecology

## 2021-08-01 DIAGNOSIS — F419 Anxiety disorder, unspecified: Secondary | ICD-10-CM | POA: Diagnosis not present

## 2021-08-01 DIAGNOSIS — F411 Generalized anxiety disorder: Secondary | ICD-10-CM | POA: Diagnosis not present

## 2021-08-01 DIAGNOSIS — F902 Attention-deficit hyperactivity disorder, combined type: Secondary | ICD-10-CM | POA: Diagnosis not present

## 2021-08-02 ENCOUNTER — Other Ambulatory Visit (HOSPITAL_BASED_OUTPATIENT_CLINIC_OR_DEPARTMENT_OTHER): Payer: Self-pay | Admitting: Obstetrics & Gynecology

## 2021-08-02 ENCOUNTER — Encounter (HOSPITAL_BASED_OUTPATIENT_CLINIC_OR_DEPARTMENT_OTHER): Payer: Self-pay

## 2021-08-02 DIAGNOSIS — Z01818 Encounter for other preprocedural examination: Secondary | ICD-10-CM

## 2021-08-09 ENCOUNTER — Ambulatory Visit (HOSPITAL_BASED_OUTPATIENT_CLINIC_OR_DEPARTMENT_OTHER): Admission: RE | Admit: 2021-08-09 | Payer: Medicare PPO | Source: Ambulatory Visit | Admitting: Obstetrics & Gynecology

## 2021-08-09 ENCOUNTER — Encounter (HOSPITAL_BASED_OUTPATIENT_CLINIC_OR_DEPARTMENT_OTHER): Admission: RE | Payer: Self-pay | Source: Ambulatory Visit

## 2021-08-09 SURGERY — DILATATION AND CURETTAGE /HYSTEROSCOPY
Anesthesia: Choice

## 2021-08-18 ENCOUNTER — Encounter (HOSPITAL_BASED_OUTPATIENT_CLINIC_OR_DEPARTMENT_OTHER): Payer: Self-pay | Admitting: Obstetrics & Gynecology

## 2021-08-21 ENCOUNTER — Encounter (HOSPITAL_COMMUNITY)
Admission: RE | Admit: 2021-08-21 | Discharge: 2021-08-21 | Disposition: A | Discharge status: Home / Self Care | Payer: Medicare PPO | Source: Ambulatory Visit | Attending: Obstetrics & Gynecology | Admitting: Obstetrics & Gynecology

## 2021-08-21 ENCOUNTER — Other Ambulatory Visit: Payer: Self-pay

## 2021-08-21 ENCOUNTER — Other Ambulatory Visit: Payer: Self-pay | Admitting: Family Medicine

## 2021-08-21 DIAGNOSIS — Z01812 Encounter for preprocedural laboratory examination: Secondary | ICD-10-CM | POA: Diagnosis not present

## 2021-08-21 DIAGNOSIS — Z01818 Encounter for other preprocedural examination: Secondary | ICD-10-CM

## 2021-08-21 DIAGNOSIS — K219 Gastro-esophageal reflux disease without esophagitis: Secondary | ICD-10-CM

## 2021-08-21 LAB — CBC
HCT: 36.3 % (ref 36.0–46.0)
Hemoglobin: 12.6 g/dL (ref 12.0–15.0)
MCH: 30.1 pg (ref 26.0–34.0)
MCHC: 34.7 g/dL (ref 30.0–36.0)
MCV: 86.6 fL (ref 80.0–100.0)
Platelets: 210 10*3/uL (ref 150–400)
RBC: 4.19 MIL/uL (ref 3.87–5.11)
RDW: 13.1 % (ref 11.5–15.5)
WBC: 5.6 10*3/uL (ref 4.0–10.5)
nRBC: 0 % (ref 0.0–0.2)

## 2021-08-21 LAB — BASIC METABOLIC PANEL
Anion gap: 7 (ref 5–15)
BUN: 16 mg/dL (ref 8–23)
CO2: 27 mmol/L (ref 22–32)
Calcium: 9.1 mg/dL (ref 8.9–10.3)
Chloride: 102 mmol/L (ref 98–111)
Creatinine, Ser: 0.65 mg/dL (ref 0.44–1.00)
GFR, Estimated: >60 mL/min (ref >60)
Glucose, Bld: 136 mg/dL — ABNORMAL HIGH (ref 70–99)
Potassium: 3.6 mmol/L (ref 3.5–5.1)
Sodium: 136 mmol/L (ref 135–145)

## 2021-08-22 ENCOUNTER — Encounter (HOSPITAL_BASED_OUTPATIENT_CLINIC_OR_DEPARTMENT_OTHER): Payer: Self-pay | Admitting: Obstetrics & Gynecology

## 2021-08-22 ENCOUNTER — Ambulatory Visit (INDEPENDENT_AMBULATORY_CARE_PROVIDER_SITE_OTHER): Payer: Medicare PPO | Admitting: Obstetrics & Gynecology

## 2021-08-22 VITALS — BP 128/42 | HR 77 | Ht 59.0 in | Wt 107.4 lb

## 2021-08-22 DIAGNOSIS — Z780 Asymptomatic menopausal state: Secondary | ICD-10-CM

## 2021-08-22 DIAGNOSIS — N9489 Other specified conditions associated with female genital organs and menstrual cycle: Secondary | ICD-10-CM

## 2021-08-22 MED ORDER — PREMARIN 0.625 MG/GM VA CREA: TOPICAL_CREAM | VAGINAL | 4 refills | Status: AC
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
## 2021-08-22 NOTE — Anesthesia Preprocedure Evaluation (Addendum)
Anesthesia Evaluation  Patient identified by MRN, date of birth, ID band Patient awake    Reviewed: Allergy & Precautions, NPO status , Patient's Chart, lab work & pertinent test results  History of Anesthesia Complications (+) PONV and history of anesthetic complications  Airway Mallampati: II  TM Distance: >3 FB Neck ROM: Full    Dental no notable dental hx.    Pulmonary sleep apnea and Continuous Positive Airway Pressure Ventilation ,    Pulmonary exam normal        Cardiovascular negative cardio ROS   Rhythm:Regular Rate:Normal     Neuro/Psych Anxiety negative neurological ROS     GI/Hepatic Neg liver ROS, GERD  Medicated,  Endo/Other  Hypothyroidism   Renal/GU   Female GU complaint Endometrial thickening    Musculoskeletal  (+) Arthritis , Osteoarthritis,    Abdominal Normal abdominal exam  (+)   Peds  (+) ATTENTION DEFICIT DISORDER WITHOUT HYPERACTIVITY Hematology   Anesthesia Other Findings   Reproductive/Obstetrics                            Anesthesia Physical Anesthesia Plan  ASA: 2  Anesthesia Plan: General   Post-op Pain Management:    Induction: Intravenous  PONV Risk Score and Plan: 4 or greater and Ondansetron, Aprepitant, Dexamethasone, Midazolam, Treatment may vary due to age or medical condition and Amisulpride  Airway Management Planned: Mask and LMA  Additional Equipment: None  Intra-op Plan:   Post-operative Plan: Extubation in OR  Informed Consent: I have reviewed the patients History and Physical, chart, labs and discussed the procedure including the risks, benefits and alternatives for the proposed anesthesia with the patient or authorized representative who has indicated his/her understanding and acceptance.     Dental advisory given  Plan Discussed with: CRNA  Anesthesia Plan Comments: (Lab Results      Component                Value                Date                      WBC                      5.6                 08/21/2021                HGB                      12.6                08/21/2021                HCT                      36.3                08/21/2021                MCV                      86.6  08/21/2021                PLT                      210                 08/21/2021           Lab Results      Component                Value               Date                      NA                       136                 08/21/2021                K                        3.6                 08/21/2021                CO2                      27                  08/21/2021                GLUCOSE                  136 (H)             08/21/2021                BUN                      16                  08/21/2021                CREATININE               0.65                08/21/2021                CALCIUM                  9.1                 08/21/2021                EGFR                     93                  04/25/2021                GFRNONAA                 >60                 08/21/2021          )

## 2021-08-22 NOTE — Progress Notes (Signed)
Unable to reach pt after multiple messages left over several days. Left voice mail message: npo after midnight, arrive 530 am @ [place], bring all current medications with you day of surgery. You may shower as usual nothing on skin after shower, remove all metal and body piercings from skin, call [phone] if sickness in am. Cbc bmp t & s done 08-21-2021 epic

## 2021-08-23 ENCOUNTER — Ambulatory Visit (HOSPITAL_BASED_OUTPATIENT_CLINIC_OR_DEPARTMENT_OTHER): Payer: Medicare PPO | Admitting: Anesthesiology

## 2021-08-23 ENCOUNTER — Ambulatory Visit (HOSPITAL_BASED_OUTPATIENT_CLINIC_OR_DEPARTMENT_OTHER)
Admission: RE | Admit: 2021-08-23 | Discharge: 2021-08-23 | Disposition: A | Discharge status: Home / Self Care | Payer: Medicare PPO | Source: Ambulatory Visit | Attending: Obstetrics & Gynecology | Admitting: Obstetrics & Gynecology

## 2021-08-23 ENCOUNTER — Encounter (HOSPITAL_BASED_OUTPATIENT_CLINIC_OR_DEPARTMENT_OTHER)
Admission: RE | Disposition: A | Discharge status: Home / Self Care | Payer: Self-pay | Source: Ambulatory Visit | Attending: Obstetrics & Gynecology

## 2021-08-23 ENCOUNTER — Encounter (HOSPITAL_BASED_OUTPATIENT_CLINIC_OR_DEPARTMENT_OTHER): Payer: Self-pay | Admitting: Obstetrics & Gynecology

## 2021-08-23 ENCOUNTER — Other Ambulatory Visit: Payer: Self-pay

## 2021-08-23 DIAGNOSIS — F909 Attention-deficit hyperactivity disorder, unspecified type: Secondary | ICD-10-CM | POA: Diagnosis not present

## 2021-08-23 DIAGNOSIS — G4730 Sleep apnea, unspecified: Secondary | ICD-10-CM | POA: Insufficient documentation

## 2021-08-23 DIAGNOSIS — K219 Gastro-esophageal reflux disease without esophagitis: Secondary | ICD-10-CM | POA: Diagnosis not present

## 2021-08-23 DIAGNOSIS — E039 Hypothyroidism, unspecified: Secondary | ICD-10-CM | POA: Diagnosis not present

## 2021-08-23 DIAGNOSIS — N840 Polyp of corpus uteri: Secondary | ICD-10-CM

## 2021-08-23 DIAGNOSIS — R9389 Abnormal findings on diagnostic imaging of other specified body structures: Secondary | ICD-10-CM | POA: Insufficient documentation

## 2021-08-23 HISTORY — PX: HYSTEROSCOPY WITH D & C: SHX1775

## 2021-08-23 LAB — ABO/RH: ABO/RH(D): A NEG

## 2021-08-23 LAB — TYPE AND SCREEN
ABO/RH(D): A NEG
Antibody Screen: NEGATIVE

## 2021-08-23 SURGERY — DILATATION AND CURETTAGE /HYSTEROSCOPY
Anesthesia: General | Site: Uterus

## 2021-08-23 MED ORDER — SODIUM CHLORIDE 0.9 % IR SOLN
Status: DC | PRN
  Administered 2021-08-23: 350 mL

## 2021-08-23 MED ORDER — PROPOFOL 10 MG/ML IV BOLUS
INTRAVENOUS | Status: DC | PRN
  Administered 2021-08-23: 150 mg via INTRAVENOUS

## 2021-08-23 MED ORDER — POVIDONE-IODINE 10 % EX SWAB: 2.0000 "application " | Freq: Once | CUTANEOUS | Status: DC

## 2021-08-23 MED ORDER — LACTATED RINGERS IV SOLN: INTRAVENOUS | Status: DC

## 2021-08-23 MED ORDER — LIDOCAINE-EPINEPHRINE 1 %-1:100000 IJ SOLN
INTRAMUSCULAR | Status: DC | PRN
  Administered 2021-08-23: 10 mL

## 2021-08-23 MED ORDER — LIDOCAINE HCL (PF) 2 % IJ SOLN
INTRAMUSCULAR | Status: AC
  Filled 2021-08-23: qty 5

## 2021-08-23 MED ORDER — DEXAMETHASONE SODIUM PHOSPHATE 4 MG/ML IJ SOLN
INTRAMUSCULAR | Status: DC | PRN
Start: 2021-08-23 — End: 2021-08-23
  Administered 2021-08-23: 10 mg via INTRAVENOUS

## 2021-08-23 MED ORDER — FENTANYL CITRATE (PF) 100 MCG/2ML IJ SOLN
INTRAMUSCULAR | Status: DC | PRN
  Administered 2021-08-23: 100 ug via INTRAVENOUS

## 2021-08-23 MED ORDER — APREPITANT 40 MG PO CAPS
ORAL_CAPSULE | ORAL | Status: AC
  Filled 2021-08-23: qty 1

## 2021-08-23 MED ORDER — FENTANYL CITRATE (PF) 100 MCG/2ML IJ SOLN
INTRAMUSCULAR | Status: AC
  Filled 2021-08-23: qty 2

## 2021-08-23 MED ORDER — ACETAMINOPHEN 10 MG/ML IV SOLN: 1000.0000 mg | Freq: Once | INTRAVENOUS | Status: DC | PRN

## 2021-08-23 MED ORDER — FENTANYL CITRATE (PF) 100 MCG/2ML IJ SOLN: 25.0000 ug | INTRAMUSCULAR | Status: DC | PRN

## 2021-08-23 MED ORDER — ONDANSETRON HCL 4 MG/2ML IJ SOLN
INTRAMUSCULAR | Status: DC | PRN
  Administered 2021-08-23: 4 mg via INTRAVENOUS

## 2021-08-23 MED ORDER — DEXAMETHASONE SODIUM PHOSPHATE 10 MG/ML IJ SOLN
INTRAMUSCULAR | Status: AC
  Filled 2021-08-23: qty 1

## 2021-08-23 MED ORDER — HYDROCODONE-ACETAMINOPHEN 5-325 MG PO TABS
1.0000 | ORAL_TABLET | Freq: Four times a day (QID) | ORAL | 0 refills | Status: DC | PRN
Start: 2021-08-23 — End: 2021-10-19

## 2021-08-23 MED ORDER — PROPOFOL 500 MG/50ML IV EMUL
INTRAVENOUS | Status: DC | PRN
  Administered 2021-08-23: 125 ug/kg/min via INTRAVENOUS

## 2021-08-23 MED ORDER — AMISULPRIDE (ANTIEMETIC) 5 MG/2ML IV SOLN: 10.0000 mg | Freq: Once | INTRAVENOUS | Status: DC | PRN

## 2021-08-23 MED ORDER — ONDANSETRON HCL 4 MG/2ML IJ SOLN
INTRAMUSCULAR | Status: AC
Start: 2021-08-23 — End: ?
  Filled 2021-08-23: qty 2

## 2021-08-23 MED ORDER — KETOROLAC TROMETHAMINE 15 MG/ML IJ SOLN
INTRAMUSCULAR | Status: DC | PRN
  Administered 2021-08-23: 15 mg via INTRAVENOUS

## 2021-08-23 MED ORDER — APREPITANT 40 MG PO CAPS
40.0000 mg | ORAL_CAPSULE | Freq: Once | ORAL | Status: AC
  Administered 2021-08-23: 40 mg via ORAL

## 2021-08-23 MED ORDER — PROPOFOL 500 MG/50ML IV EMUL
INTRAVENOUS | Status: AC
  Filled 2021-08-23: qty 50

## 2021-08-23 MED ORDER — LIDOCAINE HCL (CARDIAC) PF 100 MG/5ML IV SOSY
PREFILLED_SYRINGE | INTRAVENOUS | Status: DC | PRN
Start: 2021-08-23 — End: 2021-08-23
  Administered 2021-08-23: 60 mg via INTRAVENOUS

## 2021-08-23 MED ORDER — IBUPROFEN 800 MG PO TABS: 800.0000 mg | ORAL_TABLET | Freq: Three times a day (TID) | ORAL | 0 refills | Status: DC | PRN

## 2021-08-23 SURGICAL SUPPLY — 21 items
BIPOLAR CUTTING LOOP 21FR (ELECTRODE)
CATH ROBINSON RED A/P 16FR (CATHETERS) ×2 IMPLANT
DEVICE MYOSURE LITE (MISCELLANEOUS) ×1 IMPLANT
DEVICE MYOSURE REACH (MISCELLANEOUS) IMPLANT
DILATOR CANAL MILEX (MISCELLANEOUS) ×1 IMPLANT
DRSG TELFA 3X8 NADH (GAUZE/BANDAGES/DRESSINGS) ×2 IMPLANT
GAUZE 4X4 16PLY ~~LOC~~+RFID DBL (SPONGE) ×4 IMPLANT
GLOVE SURG LTX SZ6.5 (GLOVE) ×4 IMPLANT
GLOVE SURG UNDER POLY LF SZ7 (GLOVE) ×2 IMPLANT
GOWN STRL REUS W/TWL LRG LVL3 (GOWN DISPOSABLE) ×4 IMPLANT
IV NS IRRIG 3000ML ARTHROMATIC (IV SOLUTION) ×2 IMPLANT
KIT PROCEDURE FLUENT (KITS) ×2 IMPLANT
KIT TURNOVER CYSTO (KITS) ×2 IMPLANT
LOOP CUTTING BIPOLAR 21FR (ELECTRODE) IMPLANT
PACK VAGINAL MINOR WOMEN LF (CUSTOM PROCEDURE TRAY) ×2 IMPLANT
PAD DRESSING TELFA 3X8 NADH (GAUZE/BANDAGES/DRESSINGS) ×1 IMPLANT
PAD OB MATERNITY 4.3X12.25 (PERSONAL CARE ITEMS) ×2 IMPLANT
SEAL CERVICAL OMNI LOK (ABLATOR) IMPLANT
SEAL ROD LENS SCOPE MYOSURE (ABLATOR) ×2 IMPLANT
TOWEL OR 17X26 10 PK STRL BLUE (TOWEL DISPOSABLE) ×2 IMPLANT
WATER STERILE IRR 500ML POUR (IV SOLUTION) ×2 IMPLANT

## 2021-08-23 NOTE — Anesthesia Procedure Notes (Signed)
Procedure Name: LMA Insertion Date/Time: 08/23/2021 7:31 AM Performed by: Lieutenant Diego, CRNA Pre-anesthesia Checklist: Patient identified, Emergency Drugs available, Suction available and Patient being monitored Patient Re-evaluated:Patient Re-evaluated prior to induction Oxygen Delivery Method: Circle system utilized Preoxygenation: Pre-oxygenation with 100% oxygen Induction Type: IV induction Ventilation: Mask ventilation without difficulty LMA: LMA inserted LMA Size: 3.0 Number of attempts: 1 Placement Confirmation: positive ETCO2 and breath sounds checked- equal and bilateral Tube secured with: Tape Dental Injury: Teeth and Oropharynx as per pre-operative assessment

## 2021-08-23 NOTE — Anesthesia Postprocedure Evaluation (Signed)
Anesthesia Post Note  Patient: Nicole Allen  Procedure(s) Performed: DILATATION AND CURETTAGE /HYSTEROSCOPY WITH MYOSURE (Uterus)     Patient location during evaluation: PACU Anesthesia Type: General Level of consciousness: awake and alert Pain management: pain level controlled Vital Signs Assessment: post-procedure vital signs reviewed and stable Respiratory status: spontaneous breathing, nonlabored ventilation, respiratory function stable and patient connected to nasal cannula oxygen Cardiovascular status: blood pressure returned to baseline and stable Postop Assessment: no apparent nausea or vomiting Anesthetic complications: no   No notable events documented.  Last Vitals:  Vitals:   08/23/21 0845 08/23/21 0921  BP: 126/66 (!) 115/55  Pulse: 65 74  Resp: 10 20  Temp:    SpO2: 100% 100%    Last Pain:  Vitals:   08/23/21 0921  TempSrc:   PainSc: 0-No pain                 [name]

## 2021-08-23 NOTE — Op Note (Signed)
08/23/2021  8:08 AM  PATIENT:  Nicole Allen  71 y.o. female  PRE-OPERATIVE DIAGNOSIS:  Thickened Endometrium Polyp  POST-OPERATIVE DIAGNOSIS:  Thickened Endometrium, Endometrial polyp  PROCEDURE:  Procedure(s): DILATATION AND CURETTAGE /HYSTEROSCOPY WITH MYOSURE  SURGEON:  Megan Salon  ASSISTANTS: OR staff.    ANESTHESIA:   LMA without inhaled gas anesthesia to prevent postop nausea/vomiting  ESTIMATED BLOOD LOSS: 10cc  BLOOD ADMINISTERED:none   FLUIDS: 500cc LR  UOP: voided before going back to OR  SPECIMEN:  endometrial polyp and curetting  DISPOSITION OF SPECIMEN:  PATHOLOGY  FINDINGS: anterior endometrial polyp, thin and atrophic appearing endometrium  DESCRIPTION OF OPERATION: Patient was taken to the operating room.  She is placed in the supine position. SCDs were on her lower extremities and functioning properly. General anesthesia with an LMA was administered without difficulty. Dr. Rex Kras, anesthesia, oversaw case.  Legs were then placed in the [place] in the low lithotomy position. The legs were lifted to the high lithotomy position and the Betadine prep was used on the inner thighs perineum and vagina x3. Patient was draped in a normal standard fashion. A bivalve speculum was placed the vagina. The anterior lip of the cervix was grasped with single-tooth tenaculum.  A paracervical block of 1% lidocaine mixed one-to-one with epinephrine (1:100,000 units).  10 cc was used total. The cervix is dilated up to #21 [facility] dilators. The endometrial cavity sounded to 6 cm.   A 2.9 millimeter diagnostic hysteroscope was obtained. Normal saline was used as a hysteroscopic fluid. The hysteroscope was advanced through the endocervical canal into the endometrial cavity. The tubal ostia were noted bilaterally. Additional findings included Anterior endometrial polyp.  The Myosure lite device was obtained and passed into the endometrial cavity.  The polyp was fully  resected without difficulty.  At this point, the hysteroscope was removed. A #1 toothed curette was used to curette the cavity until rough gritty texture was noted in all quadrants. With revisualization of the hysteroscope, there was no longer any abnormal findings.  Photodocumentation was obtained.  At this point no other procedure was needed and this procedure was ended. The hysteroscope was removed. The fluid deficit was 100 cc. The tenaculum was removed from the anterior lip of the cervix. The speculum was removed from the vagina. The prep was cleansed of the patient's skin. The legs are positioned back in the supine position. Sponge, lap, needle, initially counts were correct x2. Patient was taken to recovery in stable condition.  COUNTS:  YES  PLAN OF CARE: Transfer to PACU

## 2021-08-23 NOTE — Transfer of Care (Signed)
Immediate Anesthesia Transfer of Care Note  Patient: Nicole Allen  Procedure(s) Performed: DILATATION AND CURETTAGE /HYSTEROSCOPY WITH MYOSURE (Uterus)  Patient Location: PACU  Anesthesia Type:General  Level of Consciousness: awake and alert   Airway & Oxygen Therapy: Patient Spontanous Breathing and Patient connected to face mask oxygen  Post-op Assessment: Report given to RN and Post -op Vital signs reviewed and stable  Post vital signs: Reviewed and stable  Last Vitals:  Vitals Value Taken Time  BP 98/57 08/23/21 0758  Temp    Pulse 83 08/23/21 0759  Resp 19 08/23/21 0759  SpO2 96 % 08/23/21 0759  Vitals shown include unvalidated device data.  Last Pain:  Vitals:   08/23/21 0554  TempSrc: Oral  PainSc: 0-No pain      Patients Stated Pain Goal: 5 (81/77/11 6579)  Complications: No notable events documented.

## 2021-08-23 NOTE — H&P (Signed)
Nicole Allen is an 71 y.o. female G2P2 MWF with probable endometrial polyp noted on ultrasound that was done for pelvic pain.  She is here for hysteroscopy with probable polyp resection, D&C for treatment.  Risks, benefits, alternatives have been discussed in office.  Pt is here and ready to proceed.  Pertinent Gynecological History: Menses: post-menopausal Bleeding: none Contraception: post menopausal status DES exposure: denies Blood transfusions: none Sexually transmitted diseases: no past history Previous GYN Procedures:  none   Last mammogram: normal Date: 09/17/2019 but pt reports she did do a [facility]G last year at [facility] Last pap: normal Date: 07/09/2018 OB History: G2, P2   Menstrual History: No LMP recorded (lmp unknown). Patient is postmenopausal.    Past Medical History:  Diagnosis Date   ADD (attention deficit disorder)    Allergy    SEASONAL   Anxiety    BCC (basal cell carcinoma of skin)    chest; Dr. Tonia Brooms   Cervical spondylosis    GERD (gastroesophageal reflux disease)    Hearing loss    high frequency (Dr. Prescott Parma); hearing aids recommended; bilateral   History of kidney stones    x3   Hypothyroidism 2015   Insomnia    worse with menopause   Interstitial keratitis 2021   under care of ophtho   Kidney stone    x3, Dr. Reece Agar   Osteopenia    Personal history of COVID-19 11/09/2020   PONV (postoperative nausea and vomiting)    Postmenopausal    on HRT--sees GYN   Pure hypercholesterolemia    Sleep apnea    uses CPAP    Past Surgical History:  Procedure Laterality Date   CATARACT EXTRACTION Bilateral L 10/18/15 R 12/13/15   Dr. Hollice Espy SECTION  [id number]   COLONOSCOPY  2003, 2013   Dr. Collene Mares   HYMENECTOMY  1993   KNEE SURGERY Right 09/29/2014   Dr. Jim Desanctis replacement/reconstruction   NASAL SEPTOPLASTY W/ TURBINOPLASTY Bilateral 11/11/2019   Procedure: NASAL SEPTOPLASTY WITH TURBINATE REDUCTION;  Surgeon: Jerrell Belfast, MD;   Location: [facility];  Service: ENT;  Laterality: Bilateral;   [place]   with second cesarean    Family History  Problem Relation Age of Onset   Asthma Mother    Arthritis Mother    Hypertension Mother    Hyperlipidemia Mother    Osteoporosis Mother    Hypertension Father    Heart disease Father        51's   Hyperlipidemia Father    ADD / ADHD Daughter    ADD / ADHD Son    Sleep apnea Sister    Alzheimer's disease Maternal Grandmother    Cancer Maternal Grandfather        lung   Cancer Paternal Grandfather        lung    Social History:  reports that she has never smoked. She has never used smokeless tobacco. She reports current alcohol use. She reports that she does not use drugs.  Allergies:  Allergies  Allergen Reactions   Morphine And Related Other (See Comments)    Family history.   Codeine Itching and Rash    Medications Prior to Admission  Medication Sig Dispense Refill Last Dose   amphetamine-dextroamphetamine (ADDERALL XR) 30 MG 24 hr capsule Take 1 capsule (30 mg total) by mouth every morning. 90 capsule 0 08/22/2021   cetirizine (ZYRTEC) 10 MG tablet Take 10 mg  by mouth daily.   08/22/2021   co-enzyme Q-10 30 MG capsule Take 30 mg by mouth 3 (three) times daily.   08/22/2021   eszopiclone 3 MG TABS Take 1 tablet (3 mg total) by mouth at bedtime. Take immediately before bedtime 30 tablet 0 08/22/2021   famotidine (PEPCID) 40 MG tablet Take 1 tablet (40 mg total) by mouth daily. 90 tablet 2 08/22/2021   fluticasone (FLONASE) 50 MCG/ACT nasal spray Place 1 spray into both nostrils every evening. Place 1 spray into both nostrils every evening. 14 g 0 08/22/2021   omeprazole (PRILOSEC) 40 MG capsule Take 1 capsule (40 mg total) by mouth daily. Take before dinner 90 capsule 0 08/22/2021   Probiotic Product (ALIGN) 4 MG CAPS Take 4 mg by mouth daily.   08/22/2021   simvastatin (ZOCOR) 10 MG tablet Take 1  tablet (10 mg total) by mouth at bedtime. 90 tablet 3 08/22/2021   SYNTHROID 50 MCG tablet TAKE 1 TABLET ONCE DAILY BEFORE BREAKFAST MON THRU SAT, 2 TABLETS ON SUNDAYS 102 tablet 3 08/22/2021   TURMERIC PO Take 1 capsule by mouth daily.   08/22/2021   conjugated estrogens (PREMARIN) vaginal cream Insert 0.5 grams vaginally twice weekly. 42.5 g 4 08/20/2021   ibandronate (BONIVA) 150 MG tablet TAKE ONE TABLET ONCE A MONTH IN THE MORNING ON AN EMPTY STOMACH WITH WATER. REMAIN UPRIGHT. 3 tablet 4 08/20/2021   Multiple Vitamin (MULTIVITAMIN) tablet Take 1 tablet by mouth daily.   08/21/2021    Review of Systems  All other systems reviewed and are negative.  Blood pressure (!) 124/58, pulse 71, temperature 98.1 F (36.7 C), temperature source Oral, resp. rate 14, height 4\' 11"  (1.499 m), weight 48.5 kg, SpO2 97 %. Physical Exam Constitutional:      Appearance: Normal appearance.  Cardiovascular:     Rate and Rhythm: Normal rate.     Pulses: Normal pulses.     Heart sounds: Normal heart sounds.  Pulmonary:     Effort: Pulmonary effort is normal.     Breath sounds: Normal breath sounds.  Neurological:     General: No focal deficit present.     Mental Status: She is alert.  Psychiatric:        Mood and Affect: Mood normal.    Results for orders placed or performed during the hospital encounter of 08/23/21 (from the past 24 hour(s))  ABO/Rh     Status: None (Preliminary result)   Collection Time: 08/23/21  6:08 AM  Result Value Ref Range   ABO/RH(D) PENDING     No results found.  Assessment/Plan: 71 yo G2P2 MWF with endometrial polyp here for hysteroscopy with polyp resection, D&C.  Questions answered.  Pt ready to proceed.  Megan Salon 08/23/2021, 7:03 AM

## 2021-08-23 NOTE — Discharge Instructions (Addendum)
  Post Anesthesia Home Care Instructions  Activity: Get plenty of rest for the remainder of the day. A responsible individual must stay with you for 24 hours following the procedure.  For the next 24 hours, DO NOT: -Drive a car -Operate machinery -Drink alcoholic beverages -Take any medication unless instructed by your physician -Make any legal decisions or sign important papers.  Meals: Start with liquid foods such as gelatin or soup. Progress to regular foods as tolerated. Avoid greasy, spicy, heavy foods. If nausea and/or vomiting occur, drink only clear liquids until the nausea and/or vomiting subsides. Call your physician if vomiting continues.  Special Instructions/Symptoms: Your throat may feel dry or sore from the anesthesia or the breathing tube placed in your throat during surgery. If this causes discomfort, gargle with warm salt water. The discomfort should disappear within 24 hours.  If you had a scopolamine patch placed behind your ear for the management of post- operative nausea and/or vomiting:  1. The medication in the patch is effective for 72 hours, after which it should be removed.  Wrap patch in a tissue and discard in the trash. Wash hands thoroughly with soap and water. 2. You may remove the patch earlier than 72 hours if you experience unpleasant side effects which may include dry mouth, dizziness or visual disturbances. 3. Avoid touching the patch. Wash your hands with soap and water after contact with the patch.      D & C Home care Instructions:   Personal hygiene:  Used sanitary napkins for vaginal drainage not tampons. Shower or tub bathe the day after your procedure. No douching until bleeding stops. Always wipe from front to back after  Elimination.  Activity: Do not drive or operate any equipment today. The effects of the anesthesia are still present and drowsiness may result. Rest today, not necessarily flat bed rest, just take it easy. You may resume your  normal activity in one to 2 days.  Sexual activity: No intercourse for one week or as indicated by your physician  Diet: Eat a light diet as desired this evening. You may resume a regular diet tomorrow.  Return to work: One to 2 days.  General Expectations of your surgery: Vaginal bleeding should be no heavier than a normal period. Spotting may continue up to 10 days. Mild cramps may continue for a couple of days. You may have a regular period in 2-6 weeks.  Unexpected observations call your doctor if these occur: persistent or heavy bleeding. Severe abdominal cramping or pain. Elevation of temperature greater than 100F.  Call for an appointment in one week.   

## 2021-08-24 ENCOUNTER — Encounter (HOSPITAL_BASED_OUTPATIENT_CLINIC_OR_DEPARTMENT_OTHER): Payer: Self-pay | Admitting: Obstetrics & Gynecology

## 2021-08-24 LAB — SURGICAL PATHOLOGY

## 2021-08-25 ENCOUNTER — Encounter: Payer: Self-pay | Admitting: Family Medicine

## 2021-08-25 ENCOUNTER — Other Ambulatory Visit: Payer: Self-pay | Admitting: Medical

## 2021-08-25 DIAGNOSIS — F988 Other specified behavioral and emotional disorders with onset usually occurring in childhood and adolescence: Secondary | ICD-10-CM

## 2021-08-25 MED ORDER — AMPHETAMINE-DEXTROAMPHET ER 30 MG PO CP24: 30.0000 mg | ORAL_CAPSULE | ORAL | 0 refills | Status: DC

## 2021-09-01 ENCOUNTER — Encounter: Payer: Self-pay | Admitting: Family Medicine

## 2021-09-04 ENCOUNTER — Encounter: Payer: Self-pay | Admitting: Family Medicine

## 2021-09-04 NOTE — Telephone Encounter (Signed)
Pt scheduled for tomorrow with Tawni Millers ?

## 2021-09-04 NOTE — Telephone Encounter (Signed)
Advise pt--there are lots of factors. Getting there early to acclimate to the altitude prior to ascending further with skiing is key. (Along with staying well hydrated, no alcohol, etc).  The medication is not without potential side effects.  I'm happy to have a visit with her to discuss the medications in more detail. ?

## 2021-09-05 ENCOUNTER — Other Ambulatory Visit: Payer: Self-pay

## 2021-09-05 ENCOUNTER — Telehealth: Payer: Medicare PPO | Admitting: Physician Assistant

## 2021-09-05 ENCOUNTER — Encounter: Payer: Self-pay | Admitting: Physician Assistant

## 2021-09-05 DIAGNOSIS — T7029XA Other effects of high altitude, initial encounter: Secondary | ICD-10-CM

## 2021-09-05 DIAGNOSIS — Z299 Encounter for prophylactic measures, unspecified: Secondary | ICD-10-CM

## 2021-09-05 DIAGNOSIS — F411 Generalized anxiety disorder: Secondary | ICD-10-CM | POA: Diagnosis not present

## 2021-09-05 DIAGNOSIS — F419 Anxiety disorder, unspecified: Secondary | ICD-10-CM | POA: Diagnosis not present

## 2021-09-05 DIAGNOSIS — F902 Attention-deficit hyperactivity disorder, combined type: Secondary | ICD-10-CM | POA: Diagnosis not present

## 2021-09-05 MED ORDER — ACETAZOLAMIDE 125 MG PO TABS: 125.0000 mg | ORAL_TABLET | Freq: Two times a day (BID) | ORAL | 0 refills | Status: DC

## 2021-09-05 NOTE — Progress Notes (Signed)
Start time: 10:30 am  ?End time: 10:50 am ? ?Virtual Visit via Video Note ? ? Patient ID: Nicole Allen, female    DOB: [birth year], 71 y.o.   MRN: [id number] ? ?I connected with above patient on 09/05/21 by a video enabled telemedicine application and verified that I am speaking with the correct person using two identifiers. ? ?Location: ?Patient: home ?Provider: office ?  ?I discussed the limitations of evaluation and management by telemedicine and the availability of in person appointments. The patient expressed understanding and agreed to proceed. ? ?History of Present Illness: ? ?Chief Complaint  ?Patient presents with  ? Medication Refill  ?  Wants medication for high altitude sickness. No other concerns  ? ?Reports she is doing well and uses her CPAP machine every night for her sleep apnea; will be travelling to [place] tomorrow with her Husband for  a 10 day ski trip and states the altitudes will be higher than they have been before at about 8,700 to 12,000 feet high; denies ever having altitude sickness in the past but requests to have medicine just in case; states her Husband was prescribed acetazolamide.  ? ?  ?Observations/Objective: ? ?LMP  (LMP Unknown)  ? ? ?Assessment: ?Encounter Diagnoses  ?Name Primary?  ? Encounter for prophylactic measures, unspecified Yes  ? Anoxia due to high altitude, initial encounter   ? ? ? ?Plan: ? ? ?Leetta was seen today for medication refill. ? ?Diagnoses and all orders for this visit: ? ?Encounter for prophylactic measures, unspecified ?-     acetaZOLAMIDE (DIAMOX) 125 MG tablet; Take 1 tablet (125 mg total) by mouth 2 (two) times daily. Start day before ascent and continue 2 to 3 days at maximum altitude; may use once at night thereafter to improve sleep ? ?Anoxia due to high altitude, initial encounter ?-     acetaZOLAMIDE (DIAMOX) 125 MG tablet; Take 1 tablet (125 mg total) by mouth 2 (two) times daily. Start day before ascent and continue 2 to 3 days at maximum  altitude; may use once at night thereafter to improve sleep ? ? ? ?Follow up: patient encouraged to follow all precautions for altitude sickness and medical attention if she isn't feeling well. ? ? ?I discussed the assessment and treatment plan with the patient. The patient was provided an opportunity to ask questions and all were answered. The patient agreed with the plan and demonstrated an understanding of the instructions. ?  ?The patient was advised to call back or seek an in-person evaluation if the symptoms worsen or if the condition fails to improve as anticipated. ? ?I spent 15 minutes dedicated to the care of this patient, including pre-visit review of records, face to face time, post-visit ordering of testing and documentation. ? ? ? ?Irene Pap, PA-C ?

## 2021-09-12 ENCOUNTER — Encounter (HOSPITAL_BASED_OUTPATIENT_CLINIC_OR_DEPARTMENT_OTHER): Payer: Self-pay | Admitting: Obstetrics & Gynecology

## 2021-09-19 DIAGNOSIS — F411 Generalized anxiety disorder: Secondary | ICD-10-CM | POA: Diagnosis not present

## 2021-09-19 DIAGNOSIS — F902 Attention-deficit hyperactivity disorder, combined type: Secondary | ICD-10-CM | POA: Diagnosis not present

## 2021-10-17 ENCOUNTER — Other Ambulatory Visit: Payer: Medicare PPO

## 2021-10-17 ENCOUNTER — Encounter (HOSPITAL_BASED_OUTPATIENT_CLINIC_OR_DEPARTMENT_OTHER): Payer: Self-pay | Admitting: Obstetrics & Gynecology

## 2021-10-18 ENCOUNTER — Other Ambulatory Visit: Payer: Medicare PPO

## 2021-10-18 DIAGNOSIS — E7800 Pure hypercholesterolemia, unspecified: Secondary | ICD-10-CM | POA: Diagnosis not present

## 2021-10-18 DIAGNOSIS — Z5181 Encounter for therapeutic drug level monitoring: Secondary | ICD-10-CM

## 2021-10-18 NOTE — Progress Notes (Signed)
?Chief Complaint  ?Patient presents with  ? Medicare Wellness  ?  Nonfasting AWV/CPE no gyn. No concerns. She did not have mammo in 2022-she will call and schedule mammo and DEXA.   ? ? ?Nicole Allen is a 71 y.o. female who presents for annual physical exam, Medicare wellness visit, follow-up on chronic medical conditions. See below for labs done prior to visit. ? ?She reports feeling well today.  On her recent ski trip to [place], she did not feel well. She felt fine at first, never started the acetazolamide preventatively.  She did end up feeling sick--dizzy, headache, nausea. She didn't take it for treatment.   ? ?Pt saw Dr. Sabra Heck in July 2022. Suspected endometrial polyp.  She ultimately had D&C, hysteroscopy with Myosure, and resection of polyp in 08/2021. Polyp was benign.  She plans to schedule f/u visit with Dr. Sabra Heck. She denies any bleeding or pelvic pain. ? ?OSA:  Under the care of Dr. Claiborne Billings.  She hasn't had any recurrent rashes since she got the new equipment. Compliant with use of CPAP.  Most days she feels better, refreshed.  Recently she has been struggling more with nasal congestion. Some nights she is so congested she has to take the CPAP off.  She is taking her allergy meds (zyrtec and flonase) faithfully.  She plans to contact ENT and/or Dr. Claiborne Billings to assess.  She has not tried taking any Afrin, though does recall this being mentioned in the past. But she also recalled being told Afrin was good to take regularly. ?  ?Anxiety: She remains under the care of Dr. Pearson Grippe at [facility].  She originally started her on Lexapro '5mg'$  in Aug/Sept 2022, added Buspar in October. Today she reports she was switched to Pristiq '50mg'$  after doing genetic testing. She is doing much better, tolerating it overall, but having some sexual side effects.  She plans to d/w psych. ? ?(She had been treated by our office prev for anxiety--previously stopped Lexapro due to anorgasmia, weight gain. Also prev rx'd  Buspar, didn't like how she felt on it, wasn't sure that it helped. 05/2020 depression was more of a factor, started on Wellbutrin, only took it for a month--felt dizzy, confused, hands trembling. Referred to psych at that time.) ?  ?Insomnia:  Dr. Albertine Patricia has been prescribing her lunesta '3mg'$ . Still has some nights where she can't sleep--some nights has trouble falling asleep (even with Lunesta), other nights her sleep is interrupted due to nasal congestion and can't wear the CPAP all night.  She feels it the next day, like she is in a fog.  This doesn't occur when she sleeps all night with CPAP--she feels great those days. ?  ?ADD: She continues to take Adderall.  We prescribed #90 in 05/27/21.  RF sent in by [facility] 2/24 for #90, but this wasn't picked up per PDMP. Picked up #30, prescribed by Dr. Albertine Patricia on 09/05/21. She states she picked up '25mg'$  Adderall this morning--dose was changed due to lack of availability of the '30mg'$ .  Issues related to national shortage. She plans to address ADHD with her psych, they were waiting to get the anxiety better managed before making changes. ?  ?Hypothyroidism:  She previously saw Dr. Chalmers Cater, now managed here.  She continues on the same dose, 50 mcg daily, but doubles up her dose on Sundays.  ?No changes to skin/bowels/hair/nail, energy (other than related to sleep), weight. ?Some ongoing GI issues related to foods (preservatives, chocolate). ?Lab Results  ?  Component Value Date  ? TSH 1.960 04/25/2021  ? ?Hyperlipidemia:  She was tried off simvastatin in the past (she was only on very low dose, tried without--still high, so then we tried treating mild hypothyroidism, but lipids didn't change). Went back on '10mg'$  of simvastatin (given family h/o CAD).   ?She saw Dr. Dorris Carnes in October 2017 for consult, who sent her for Ca score CT, which showed Ca score of 0, no evidence of calcified plaque. Review of her recommendations stated that she could cut back on simvastatin to '5mg'$ , follow  lipids, could stop simvastatin. Marland Kitchen   ?LDL was 124 in 09/2020, when on '10mg'$  simvastatin. We had discussed that with her excellent HDL and calcium score of 0, that LDL up to 160 would likely be acceptable, so she could do another trial off the statin if she was careful with her dietary cholesterol intake.  LDL off medication was 206, total cholesterol of 300; '10mg'$  simvastatin was restarted. She had labs repeated yesterday, see below. ?She denies any changes to her diet since the last check.  She continues to follow a low cholesterol diet. Doesn't eat red meat, has cheese, avoids mayonnaise, eats eggs once a week. She gave up chocolate and anything with preservatives, as these flare her GI issues. ?  ?Allergies:  Using flonase and zyrtec every night. Allergies are controlled, except for some nasal congestion as reported above, affecting her mainly at night in tolerating the CPAP. ?  ?GERD: She continues to take omeprazole '40mg'$  before dinner, and pepcid in the morning, per Dr. Collene Mares (after having flares of GERD not adequately controlled with omeprazole alone). This, along with cutting out preservatives and chocolate, has been effective.  Denies dysphagia. ?Uses Tums prn since returning from traveling with some dietary indiscretion. ? ?Osteopenia:  Last DEXA 08/2019-- T-2.1 at R femoral neck, -2.3 at L femoral neck, T-1.9 at spine, and -2.1 L total femur. FRAX score was 18% and 4.1% for hip fracture. ?Boniva was started in 12/2019. Due for recheck. ?Carries heavy things with gardening, carries grandkids occasionally. ?Denies side effects. ? ?Immunization History  ?Administered Date(s) Administered  ? Fluad Quad(high Dose 65+) 04/13/2019, 05/23/2020  ? Hep A / Hep B 05/08/2018  ? Hepatitis A 06/12/2018  ? Hepatitis B 06/12/2018  ? Influenza Split 04/23/2011  ? Influenza, High Dose Seasonal PF 07/11/2016, 04/10/2017, 03/27/2018  ? Influenza, Seasonal, Injecte, Preservative Fre 06/09/2012  ? Influenza,inj,Quad PF,6+ Mos  04/22/2014, 04/14/2015  ? Influenza-Unspecified 04/11/2021  ? Moderna Covid-19 Vaccine Bivalent Booster 58yr & up 04/11/2021  ? Moderna SARS-COV2 Booster Vaccination 10/13/2020  ? Moderna Sars-Covid-2 Vaccination 08/05/2019, 09/02/2019, 05/04/2020  ? Pneumococcal Conjugate-13 07/11/2016  ? Pneumococcal Polysaccharide-23 09/16/2017  ? Tdap 12/26/2005  ? Typhoid Inactivated 05/08/2018  ? Zoster Recombinat (Shingrix) 05/08/2018, 07/10/2018  ? Zoster, Live 05/14/2011  ? ?She got vaccines through health dept (travel clinic) for trip to [place](that was cancelled). [facility] reviewed--no tetanus documented as given then. Others have been abstracted into chart and noted above. She truly thinks she got a tetanus shot then. ? ?Last Pap smear: 07/2018, normal.  ?Last mammogram: 08/2019 ?Last colonoscopy: 12/2011, small polyp. She called and was told that her next colonoscopy was due 2023, not 5 years (criteria changed) ?Last DEXA:  08/2019  T-2.1 R fem neck, -2.3 L fem neck, -1.9 spine, -2.1 L total femur. FRAX 18% and 4.1% ?Ophtho: yearly  ?Dentist: every 6 months   ?Exercise:  Recent skiing.  Less active due to her  husband's knee issues (not biking or playing pickleball).  Little regular exercise currently. ? ?Vitamin D was 36 in 09/2016 ?  ?Patient Care Team: ?Rita Ohara, MD as PCP - General (Family Medicine) ?Troy Sine, MD as PCP - Cardiology (Cardiology) ?GYN: Dr. Sabra Heck ?Ophtho: Dr. Herbert Deaner (and Dr. Marica Otter as Optometrist) ?GI: Dr. Collene Mares ?Dermatologist: Dr. Delman Cheadle ?Ortho: Dr. Noemi Chapel ?Cardiology: previously saw Dr. Harrington Challenger, more recently Dr. Claiborne Billings (for sleep eval) ?Audiology: [facility] ?ENT: Dr. Wilburn Cornelia ?Psych: Dr. Pearson Grippe ([facility]) ?Psychologist: Ronda Fairly ([facility]) ?Dentist: Dr. Talbert Nan ([facility]) ? ?Depression Screening: ? ? ?  10/19/2021  ?  1:43 PM 08/22/2021  ? 10:25 AM 10/13/2020  ?  2:00 PM 05/23/2020  ? 12:10 PM 02/22/2020  ?  9:33 AM  ?Depression screen PHQ 2/9  ?Decreased Interest 0  0 '1 1 1  '$ ?Down, Depressed, Hopeless 0 0 '2 1 1  '$ ?PHQ - 2 Score 0 0 '3 2 2  '$ ?Altered sleeping 2  0 0   ?Tired, decreased energy '2  2 1   '$ ?Change in appetite 0  0 1   ?Feeling bad or failure about yourself  0  0

## 2021-10-18 NOTE — Patient Instructions (Addendum)
?HEALTH MAINTENANCE RECOMMENDATIONS: ? ?It is recommended that you get at least 30 minutes of aerobic exercise at least 5 days/week (for weight loss, you may need as much as 60-90 minutes). This can be any activity that gets your heart rate up. This can be divided in 10-15 minute intervals if needed, but try and build up your endurance at least once a week.  Weight bearing exercise is also recommended twice weekly. ? ?Eat a healthy diet with lots of vegetables, fruits and fiber.  "Colorful" foods have a lot of vitamins (ie green vegetables, tomatoes, red peppers, etc).  Limit sweet tea, regular sodas and alcoholic beverages, all of which has a lot of calories and sugar.  Up to 1 alcoholic drink daily may be beneficial for women (unless trying to lose weight, watch sugars).  Drink a lot of water. ? ?Calcium recommendations are 1200-1500 mg daily (1500 mg for postmenopausal women or women without ovaries), and vitamin D 1000 IU daily.  This should be obtained from diet and/or supplements (vitamins), and calcium should not be taken all at once, but in divided doses. ? ?Monthly self breast exams and yearly mammograms for women over the age of 71 is recommended. ? ?Sunscreen of at least SPF 30 should be used on all sun-exposed parts of the skin when outside between the hours of 10 am and 4 pm (not just when at beach or pool, but even with exercise, golf, tennis, and yard work!)  Use a sunscreen that says "broad spectrum" so it covers both UVA and UVB rays, and make sure to reapply every 1-2 hours. ? ?Remember to change the batteries in your smoke detectors when changing your clock times in the spring and fall. Carbon monoxide detectors are recommended for your home. ? ?Use your seat belt every time you are in a car, and please drive safely and not be distracted with cell phones and texting while driving. ? ? ?Ms. Rosiak , ?Thank you for taking time to come for your Medicare Wellness Visit. I appreciate your ongoing  commitment to your health goals. Please review the following plan we discussed and let me know if I can assist you in the future.  ? ?This is a list of the screening recommended for you and due dates:  ?Health Maintenance  ?Topic Date Due  ? Tetanus Vaccine  12/27/2015  ? Mammogram  09/16/2021  ? Colon Cancer Screening  12/18/2021  ? Flu Shot  01/30/2022  ? Pneumonia Vaccine  Completed  ? DEXA scan (bone density measurement)  Completed  ? COVID-19 Vaccine  Completed  ? Hepatitis C Screening: USPSTF Recommendation to screen - Ages 66-79 yo.  Completed  ? Zoster (Shingles) Vaccine  Completed  ? HPV Vaccine  Aged Out  ? ?Please get mammograms yearly. ?Please call [facility] and schedule for both your mammogram, and your follow-up bone density test.  (They will fax me the order to sign) ? ?Consider getting a tetanus booster (TdaP) from the pharmacy.  There is no record of a booster (I believe you said your travel was in 2019). No harm in getting it a little early.  There is no longer an out of pocket charge when getting this vaccine from the pharmacy (covered by Medicare part D). ? ?You are due for colonoscopy this year.  Contact Dr. Lorie Apley office to schedule.  ? ?You can try using Afrin prior to your CPAP. This should help with tolerability of the CPAP. ?Let's try switching from Flonase to Nasonex  to see if a change helps with the allergies/congestoin. ? ?Continue the simvastatin '10mg'$  daily. ?

## 2021-10-19 ENCOUNTER — Encounter: Payer: Self-pay | Admitting: Family Medicine

## 2021-10-19 ENCOUNTER — Ambulatory Visit: Payer: Medicare PPO | Admitting: Family Medicine

## 2021-10-19 VITALS — BP 132/84 | HR 84 | Ht 59.0 in | Wt 110.0 lb

## 2021-10-19 DIAGNOSIS — E039 Hypothyroidism, unspecified: Secondary | ICD-10-CM | POA: Diagnosis not present

## 2021-10-19 DIAGNOSIS — J3089 Other allergic rhinitis: Secondary | ICD-10-CM | POA: Diagnosis not present

## 2021-10-19 DIAGNOSIS — Z0000 Encounter for general adult medical examination without abnormal findings: Secondary | ICD-10-CM

## 2021-10-19 DIAGNOSIS — F988 Other specified behavioral and emotional disorders with onset usually occurring in childhood and adolescence: Secondary | ICD-10-CM

## 2021-10-19 DIAGNOSIS — F411 Generalized anxiety disorder: Secondary | ICD-10-CM

## 2021-10-19 DIAGNOSIS — M85851 Other specified disorders of bone density and structure, right thigh: Secondary | ICD-10-CM | POA: Diagnosis not present

## 2021-10-19 DIAGNOSIS — K219 Gastro-esophageal reflux disease without esophagitis: Secondary | ICD-10-CM

## 2021-10-19 DIAGNOSIS — E7800 Pure hypercholesterolemia, unspecified: Secondary | ICD-10-CM | POA: Diagnosis not present

## 2021-10-19 DIAGNOSIS — M85852 Other specified disorders of bone density and structure, left thigh: Secondary | ICD-10-CM

## 2021-10-19 DIAGNOSIS — E559 Vitamin D deficiency, unspecified: Secondary | ICD-10-CM

## 2021-10-19 DIAGNOSIS — Z5181 Encounter for therapeutic drug level monitoring: Secondary | ICD-10-CM

## 2021-10-19 DIAGNOSIS — G4733 Obstructive sleep apnea (adult) (pediatric): Secondary | ICD-10-CM | POA: Diagnosis not present

## 2021-10-19 LAB — LIPID PANEL
Chol/HDL Ratio: 3.2 ratio (ref 0.0–4.4)
Cholesterol, Total: 239 mg/dL — ABNORMAL HIGH (ref 100–199)
HDL: 74 mg/dL (ref >39)
LDL Chol Calc (NIH): 147 mg/dL — ABNORMAL HIGH (ref 0–99)
Triglycerides: 102 mg/dL (ref 0–149)
VLDL Cholesterol Cal: 18 mg/dL (ref 5–40)

## 2021-10-19 LAB — HEPATIC FUNCTION PANEL
ALT: 17 IU/L (ref 0–32)
AST: 23 IU/L (ref 0–40)
Albumin: 4.3 g/dL (ref 3.7–4.7)
Alkaline Phosphatase: 61 IU/L (ref 44–121)
Bilirubin Total: 0.2 mg/dL (ref 0.0–1.2)
Bilirubin, Direct: <0.1 mg/dL (ref 0.00–0.40)
Total Protein: 6.1 g/dL (ref 6.0–8.5)

## 2021-10-19 MED ORDER — MOMETASONE FUROATE 50 MCG/ACT NA SUSP: 2.0000 | Freq: Every day | NASAL | 12 refills | Status: DC

## 2021-10-23 ENCOUNTER — Telehealth (HOSPITAL_BASED_OUTPATIENT_CLINIC_OR_DEPARTMENT_OTHER): Payer: Self-pay | Admitting: Obstetrics & Gynecology

## 2021-10-23 NOTE — Telephone Encounter (Signed)
Called patient and left message to please call the office to set up appointment . ?

## 2021-10-24 DIAGNOSIS — F411 Generalized anxiety disorder: Secondary | ICD-10-CM | POA: Diagnosis not present

## 2021-10-24 DIAGNOSIS — F902 Attention-deficit hyperactivity disorder, combined type: Secondary | ICD-10-CM | POA: Diagnosis not present

## 2021-10-26 DIAGNOSIS — F411 Generalized anxiety disorder: Secondary | ICD-10-CM | POA: Diagnosis not present

## 2021-10-26 DIAGNOSIS — F902 Attention-deficit hyperactivity disorder, combined type: Secondary | ICD-10-CM | POA: Diagnosis not present

## 2021-11-20 ENCOUNTER — Other Ambulatory Visit (HOSPITAL_COMMUNITY)
Admission: RE | Admit: 2021-11-20 | Discharge: 2021-11-20 | Disposition: A | Discharge status: Home / Self Care | Payer: Medicare PPO | Source: Ambulatory Visit | Attending: Obstetrics & Gynecology | Admitting: Obstetrics & Gynecology

## 2021-11-20 ENCOUNTER — Encounter (HOSPITAL_BASED_OUTPATIENT_CLINIC_OR_DEPARTMENT_OTHER): Payer: Self-pay | Admitting: Obstetrics & Gynecology

## 2021-11-20 ENCOUNTER — Ambulatory Visit (HOSPITAL_BASED_OUTPATIENT_CLINIC_OR_DEPARTMENT_OTHER): Payer: Medicare PPO | Admitting: Obstetrics & Gynecology

## 2021-11-20 VITALS — BP 139/64 | HR 75 | Ht 58.5 in | Wt 110.8 lb

## 2021-11-20 DIAGNOSIS — F411 Generalized anxiety disorder: Secondary | ICD-10-CM | POA: Diagnosis not present

## 2021-11-20 DIAGNOSIS — F902 Attention-deficit hyperactivity disorder, combined type: Secondary | ICD-10-CM | POA: Diagnosis not present

## 2021-11-20 DIAGNOSIS — N898 Other specified noninflammatory disorders of vagina: Secondary | ICD-10-CM | POA: Insufficient documentation

## 2021-11-20 MED ORDER — TINIDAZOLE 500 MG PO TABS: ORAL_TABLET | ORAL | 0 refills | Status: DC

## 2021-11-20 NOTE — Progress Notes (Signed)
GYNECOLOGY  VISIT  CC:   vaginal bleeding  HPI: 71 y.o. G2P2 Married White or Caucasian female here for complaint of vaginal discharge.  She is having a little more itching.  She use OTC 3 day monistat treatment.  She has been using boric acid suppositories for a week.  This didn't really seem to help either.  Denies vaginal bleeding.  There is some odor.    Denies urinary symptoms.  Denies urinary incontinence.     Patient Active Problem List   Diagnosis Date Noted   Deviated septum 11/11/2019   Obstructive sleep apnea 11/11/2019   PLMD (periodic limb movement disorder) 07/08/2016   PONV (postoperative nausea and vomiting) 10/13/2014   Esophageal reflux 07/14/2014   Hypothyroidism 08/03/2013   Osteopenia 08/03/2013   Pure hypercholesterolemia 04/24/2011   ADD (attention deficit disorder) 04/24/2011    Past Medical History:  Diagnosis Date   ADD (attention deficit disorder)    Allergy    SEASONAL   Anxiety    BCC (basal cell carcinoma of skin)    chest; Dr. Tonia Brooms   Cervical spondylosis    Endometrial polyp    benign, removed 08/2021 (Dr. Sabra Heck)   GERD (gastroesophageal reflux disease)    Hearing loss    high frequency (Dr. Prescott Parma); hearing aids recommended; bilateral   History of kidney stones    x3   Hypothyroidism 2015   Insomnia    worse with menopause   Interstitial keratitis 2021   under care of ophtho   Kidney stone    x3, Dr. Reece Agar   Osteopenia    Personal history of COVID-19 11/09/2020   PONV (postoperative nausea and vomiting)    Postmenopausal    on HRT--sees GYN   Pure hypercholesterolemia    Sleep apnea    uses CPAP    Past Surgical History:  Procedure Laterality Date   CATARACT EXTRACTION Bilateral L 10/18/15 R 12/13/15   Dr. Hollice Espy SECTION  2202,5427   COLONOSCOPY  2003, 2013   Dr. Collene Mares   HYMENECTOMY  1993   HYSTEROSCOPY WITH D & C N/A 08/23/2021   Procedure: DILATATION AND CURETTAGE [place] WITH MYOSURE;  Surgeon:  Megan Salon, MD;  Location: [facility];  Service: Gynecology;  Laterality: N/A;   KNEE SURGERY Right 09/29/2014   Dr. Jim Desanctis replacement/reconstruction   NASAL SEPTOPLASTY W/ TURBINOPLASTY Bilateral 11/11/2019   Procedure: NASAL SEPTOPLASTY WITH TURBINATE REDUCTION;  Surgeon: Jerrell Belfast, MD;  Location: [facility];  Service: ENT;  Laterality: Bilateral;   [place]   with second cesarean    MEDS:   Current Outpatient Medications on File Prior to Visit  Medication Sig Dispense Refill   amphetamine-dextroamphetamine (ADDERALL XR) 25 MG 24 hr capsule Take 25 mg by mouth every morning.     cetirizine (ZYRTEC) 10 MG tablet Take 10 mg by mouth daily.     co-enzyme Q-10 30 MG capsule Take 30 mg by mouth 3 (three) times daily.     conjugated estrogens (PREMARIN) vaginal cream Insert 0.5 grams vaginally twice weekly. 42.5 g 4   desvenlafaxine (PRISTIQ) 50 MG 24 hr tablet Take 50 mg by mouth daily.     eszopiclone 3 MG TABS Take 1 tablet (3 mg total) by mouth at bedtime. Take immediately before bedtime 30 tablet 0   famotidine (PEPCID) 40 MG tablet Take 1 tablet (40 mg total) by mouth daily. 90 tablet 2  ibandronate (BONIVA) 150 MG tablet TAKE ONE TABLET ONCE A MONTH IN THE MORNING ON AN EMPTY STOMACH WITH WATER. REMAIN UPRIGHT. 3 tablet 4   ibuprofen (ADVIL) 800 MG tablet Take 1 tablet (800 mg total) by mouth every 8 (eight) hours as needed. 20 tablet 0   mometasone (NASONEX) 50 MCG/ACT nasal spray Place 2 sprays into the nose daily. 1 each 12   Multiple Vitamin (MULTIVITAMIN) tablet Take 1 tablet by mouth daily.     omeprazole (PRILOSEC) 40 MG capsule Take 1 capsule (40 mg total) by mouth daily. Take before dinner 90 capsule 0   Probiotic Product (ALIGN) 4 MG CAPS Take 4 mg by mouth daily.     simvastatin (ZOCOR) 10 MG tablet Take 1 tablet (10 mg total) by mouth at bedtime. 90 tablet 3   SYNTHROID 50 MCG  tablet TAKE 1 TABLET ONCE DAILY BEFORE BREAKFAST MON THRU SAT, 2 TABLETS ON 'SUNDAYS 102 tablet 3   TURMERIC PO Take 1 capsule by mouth daily.     No current facility-administered medications on file prior to visit.    ALLERGIES: Morphine and related and Codeine  Family History  Problem Relation Age of Onset   Asthma Mother    Arthritis Mother    Hypertension Mother    Hyperlipidemia Mother    Osteoporosis Mother    Hypertension Father    Heart disease Father        70'$ 's   Hyperlipidemia Father    ADD / ADHD Daughter    ADD / ADHD Son    Sleep apnea Sister    Alzheimer's disease Maternal Grandmother    Cancer Maternal Grandfather        lung   Cancer Paternal Grandfather        lung    SH:  married, non smoker  Review of Systems  Genitourinary: Negative.    PHYSICAL EXAMINATION:    BP 139/64 (BP Location: Right Leg, Patient Position: Sitting, Cuff Size: Normal)   Pulse 75   Ht 4' 10.5" (1.486 m) Comment: reported  Wt 110 lb 12.8 oz (50.3 kg)   LMP  (LMP Unknown)   BMI 22.76 kg/m     General appearance: alert, cooperative and appears stated age Lymph:  no inguinal LAD noted  Pelvic: External genitalia:  no lesions              Urethra:  normal appearing urethra with no masses, tenderness or lesions              Bartholins and Skenes: normal                 Vagina: normal appearing vagina with normal color and whitish discharge noted              Chaperone, Octaviano Batty, CMA, was present for exam.  Assessment/Plan: 1. Vaginal discharge - Cervicovaginal ancillary only( [place]) - Pt is going out of town tomorrow so will prescribe tindamax now.  Pt aware may need to change medication based on test results.  Tinidazole (TINDAMAX) 500 MG tablet; Take 2 grams po x 1 today and repeat in 24 hours  Dispense: 8 tablet; Refill: 0

## 2021-11-21 LAB — CERVICOVAGINAL ANCILLARY ONLY
Bacterial Vaginitis (gardnerella): NEGATIVE
Candida Glabrata: NEGATIVE
Candida Vaginitis: NEGATIVE
Comment: NEGATIVE
Comment: NEGATIVE
Comment: NEGATIVE

## 2021-12-06 ENCOUNTER — Other Ambulatory Visit: Payer: Self-pay | Admitting: Family Medicine

## 2021-12-06 DIAGNOSIS — K219 Gastro-esophageal reflux disease without esophagitis: Secondary | ICD-10-CM

## 2021-12-08 ENCOUNTER — Encounter: Payer: Self-pay | Admitting: Family Medicine

## 2021-12-26 DIAGNOSIS — F411 Generalized anxiety disorder: Secondary | ICD-10-CM | POA: Diagnosis not present

## 2021-12-26 DIAGNOSIS — F902 Attention-deficit hyperactivity disorder, combined type: Secondary | ICD-10-CM | POA: Diagnosis not present

## 2022-03-01 ENCOUNTER — Other Ambulatory Visit: Payer: Self-pay | Admitting: Family Medicine

## 2022-03-01 DIAGNOSIS — K219 Gastro-esophageal reflux disease without esophagitis: Secondary | ICD-10-CM

## 2022-03-07 ENCOUNTER — Encounter: Payer: Self-pay | Admitting: Internal Medicine

## 2022-04-10 ENCOUNTER — Encounter: Payer: Self-pay | Admitting: Internal Medicine

## 2022-04-12 ENCOUNTER — Encounter: Payer: Self-pay | Admitting: Family Medicine

## 2022-04-13 ENCOUNTER — Encounter: Payer: Self-pay | Admitting: Medical

## 2022-04-13 ENCOUNTER — Telehealth: Payer: Medicare PPO | Admitting: Medical

## 2022-04-13 VITALS — Temp 99.6°F | Wt 110.0 lb

## 2022-04-13 DIAGNOSIS — R509 Fever, unspecified: Secondary | ICD-10-CM

## 2022-04-13 DIAGNOSIS — J988 Other specified respiratory disorders: Secondary | ICD-10-CM

## 2022-04-13 DIAGNOSIS — R058 Other specified cough: Secondary | ICD-10-CM

## 2022-04-13 MED ORDER — ALBUTEROL SULFATE HFA 108 (90 BASE) MCG/ACT IN AERS: 2.0000 | INHALATION_SPRAY | Freq: Four times a day (QID) | RESPIRATORY_TRACT | 0 refills | Status: DC | PRN

## 2022-04-13 MED ORDER — HYDROCODONE BIT-HOMATROP MBR 5-1.5 MG/5ML PO SOLN: 5.0000 mL | Freq: Three times a day (TID) | ORAL | 0 refills | Status: AC | PRN

## 2022-04-13 MED ORDER — AZITHROMYCIN 250 MG PO TABS: ORAL_TABLET | ORAL | 0 refills | Status: DC

## 2022-04-13 NOTE — Progress Notes (Signed)
Subjective:     Patient ID: Nicole Allen, female   DOB: 12/18/1950, 71 y.o.   MRN: [id number]  This visit type was conducted due to national recommendations for restrictions regarding the COVID-19 Pandemic (e.g. social distancing) in an effort to limit this patient's exposure and mitigate transmission in our community.  Due to their co-morbid illnesses, this patient is at least at moderate risk for complications without adequate follow up.  This format is felt to be most appropriate for this patient at this time.    Documentation for virtual audio and video telecommunications through [facility] encounter:  The patient was located at home. The provider was located in the office. The patient did consent to this visit and is aware of possible charges through their insurance for this visit.  The other persons participating in this telemedicine service were none. Time spent on call was 20 minutes and in review of previous records 20 minutes total.  This virtual service is not related to other E/M service within previous 7 days.   HPI Chief Complaint  Patient presents with   other    Cough, fever, chills, fatigue, started last Saturday night, scratchy throat, tested negative for covid 3 times, head ache, hot and cold at night time.    Virtual for illness.  She notes 6 days of illness that is not letting up.  She notes a variety of symptoms including bad cough, fever, chills, fatigue, scratchy throat, hot and cold feeling, headache.  She has had 3 separate negative test over the last week.  No sick contacts.  She is bothered by the constant fever and cough.  She notes that her normal temperature is 97 but she says she has been running about 99.  Using ibuprofen and acetaminophen alternating.  Using some guaifenesin day, night time version at night with cough suppressant.  Has used some benadryl.  She has a complete lack of energy.  No loss of taste or smell.  No hx/o lung disease or asthma.    Nonsmoker.  No recent travel.  No swelling or pain in calveNo other aggravating or relieving factors. No other complaint. .   Past Medical History:  Diagnosis Date   ADD (attention deficit disorder)    Allergy    SEASONAL   Anxiety    BCC (basal cell carcinoma of skin)    chest; Dr. Tonia Brooms   Cervical spondylosis    Endometrial polyp    benign, removed 08/2021 (Dr. Sabra Heck)   GERD (gastroesophageal reflux disease)    Hearing loss    high frequency (Dr. Prescott Parma); hearing aids recommended; bilateral   History of kidney stones    x3   Hypothyroidism 2015   Insomnia    worse with menopause   Interstitial keratitis 2021   under care of ophtho   Kidney stone    x3, Dr. Reece Agar   Osteopenia    Personal history of COVID-19 11/09/2020   PONV (postoperative nausea and vomiting)    Postmenopausal    on HRT--sees GYN   Pure hypercholesterolemia    Sleep apnea    uses CPAP   Current Outpatient Medications on File Prior to Visit  Medication Sig Dispense Refill   amphetamine-dextroamphetamine (ADDERALL XR) 25 MG 24 hr capsule Take 25 mg by mouth every morning.     cetirizine (ZYRTEC) 10 MG tablet Take 10 mg by mouth daily.     co-enzyme Q-10 30 MG capsule Take 30 mg by mouth 3 (three) times daily.  conjugated estrogens (PREMARIN) vaginal cream Insert 0.5 grams vaginally twice weekly. 42.5 g 4   desvenlafaxine (PRISTIQ) 50 MG 24 hr tablet Take 50 mg by mouth daily.     eszopiclone 3 MG TABS Take 1 tablet (3 mg total) by mouth at bedtime. Take immediately before bedtime 30 tablet 0   famotidine (PEPCID) 40 MG tablet Take 1 tablet (40 mg total) by mouth daily. 90 tablet 2   ibandronate (BONIVA) 150 MG tablet TAKE ONE TABLET ONCE A MONTH IN THE MORNING ON AN EMPTY STOMACH WITH WATER. REMAIN UPRIGHT. 3 tablet 4   ibuprofen (ADVIL) 800 MG tablet Take 1 tablet (800 mg total) by mouth every 8 (eight) hours as needed. 20 tablet 0   mometasone (NASONEX) 50 MCG/ACT nasal spray Place 2 sprays  into the nose daily. 1 each 12   Multiple Vitamin (MULTIVITAMIN) tablet Take 1 tablet by mouth daily.     omeprazole (PRILOSEC) 40 MG capsule TAKE ONE CAPSULE BY MOUTH DAILY BEFORE dinner 90 capsule 0   Probiotic Product (ALIGN) 4 MG CAPS Take 4 mg by mouth daily.     simvastatin (ZOCOR) 10 MG tablet Take 1 tablet (10 mg total) by mouth at bedtime. 90 tablet 3   SYNTHROID 50 MCG tablet TAKE 1 TABLET ONCE DAILY BEFORE BREAKFAST MON THRU SAT, 2 TABLETS ON SUNDAYS 102 tablet 3   tinidazole (TINDAMAX) 500 MG tablet Take 2 grams po x 1 today and repeat in 24 hours 8 tablet 0   TURMERIC PO Take 1 capsule by mouth daily.     No current facility-administered medications on file prior to visit.    Review of Systems As in subjective    Objective:   Physical Exam Due to coronavirus pandemic stay at home measures, patient visit was virtual and they were not examined in person.   Gen: wd, wn, nad Coughing, clearing throat at times but no obvious wheezing or dyspnea Answers questions appropriately     Assessment:     Encounter Diagnoses  Name Primary?   Respiratory tract infection Yes   Productive cough    Fever, unspecified fever cause        Plan:     We discussed symptoms and concerns, recommended in person eval and possible labs and chest x-ray and vital signs.  She is not able to come to our office and does not want to pursue those at this time.  We discussed the potential differential and potential for worsening over the weekend.  Continue Tylenol as she is doing help with fever and aches, continue over-the-counter cough medicine.  She wants something stronger for cough and has used Hycodan before.  We will use Hycodan but discussed the risk and potential for sedation.  During the day if she does not need a stronger cough medicine she will do the over-the-counter cough suppressant.  Continue to rest and hydrate well.  Can use albuterol as needed.  She has used albuterol in the past for  coughing fits or shortness of breath.  Begin Z-Pak  Advised if much worse over the next few days or if not improving to call or recheck or get reevaluated over the weekend  Carianne was seen today for other.  Diagnoses and all orders for this visit:  Respiratory tract infection  Productive cough  Fever, unspecified fever cause  Other orders -     HYDROcodone bit-homatropine (HYCODAN) 5-1.5 MG/5ML syrup; Take 5 mLs by mouth every 8 (eight) hours as needed for up  to 5 days for cough. -     azithromycin (ZITHROMAX) 250 MG tablet; 2 tablets day 1, then 1 tablet days 2-4 -     albuterol (VENTOLIN HFA) 108 (90 Base) MCG/ACT inhaler; Inhale 2 puffs into the lungs every 6 (six) hours as needed for wheezing or shortness of breath.    F/u prn

## 2022-04-16 ENCOUNTER — Other Ambulatory Visit: Payer: Self-pay | Admitting: Family Medicine

## 2022-04-16 DIAGNOSIS — Z9189 Other specified personal risk factors, not elsewhere classified: Secondary | ICD-10-CM

## 2022-04-24 DIAGNOSIS — J3489 Other specified disorders of nose and nasal sinuses: Secondary | ICD-10-CM | POA: Diagnosis not present

## 2022-04-24 DIAGNOSIS — G4733 Obstructive sleep apnea (adult) (pediatric): Secondary | ICD-10-CM | POA: Diagnosis not present

## 2022-04-26 ENCOUNTER — Other Ambulatory Visit: Payer: Medicare PPO

## 2022-04-26 DIAGNOSIS — Z5181 Encounter for therapeutic drug level monitoring: Secondary | ICD-10-CM

## 2022-04-26 DIAGNOSIS — M85852 Other specified disorders of bone density and structure, left thigh: Secondary | ICD-10-CM | POA: Diagnosis not present

## 2022-04-26 DIAGNOSIS — E559 Vitamin D deficiency, unspecified: Secondary | ICD-10-CM

## 2022-04-26 DIAGNOSIS — M85851 Other specified disorders of bone density and structure, right thigh: Secondary | ICD-10-CM

## 2022-04-26 DIAGNOSIS — E039 Hypothyroidism, unspecified: Secondary | ICD-10-CM | POA: Diagnosis not present

## 2022-04-27 LAB — COMPREHENSIVE METABOLIC PANEL
ALT: 10 IU/L (ref 0–32)
AST: 16 IU/L (ref 0–40)
Albumin/Globulin Ratio: 2 (ref 1.2–2.2)
Albumin: 4.3 g/dL (ref 3.8–4.8)
Alkaline Phosphatase: 75 IU/L (ref 44–121)
BUN/Creatinine Ratio: 18 (ref 12–28)
BUN: 11 mg/dL (ref 8–27)
Bilirubin Total: <0.2 mg/dL (ref 0.0–1.2)
CO2: 25 mmol/L (ref 20–29)
Calcium: 9.2 mg/dL (ref 8.7–10.3)
Chloride: 104 mmol/L (ref 96–106)
Creatinine, Ser: 0.61 mg/dL (ref 0.57–1.00)
Globulin, Total: 2.1 g/dL (ref 1.5–4.5)
Glucose: 87 mg/dL (ref 70–99)
Potassium: 4.1 mmol/L (ref 3.5–5.2)
Sodium: 143 mmol/L (ref 134–144)
Total Protein: 6.4 g/dL (ref 6.0–8.5)
eGFR: 96 mL/min/{1.73_m2} (ref >59)

## 2022-04-27 LAB — CBC WITH DIFFERENTIAL/PLATELET
Basophils Absolute: 0.1 10*3/uL (ref 0.0–0.2)
Basos: 2 %
EOS (ABSOLUTE): 0.3 10*3/uL (ref 0.0–0.4)
Eos: 5 %
Hematocrit: 32.5 % — ABNORMAL LOW (ref 34.0–46.6)
Hemoglobin: 11.6 g/dL (ref 11.1–15.9)
Immature Grans (Abs): 0 10*3/uL (ref 0.0–0.1)
Immature Granulocytes: 1 %
Lymphocytes Absolute: 2.2 10*3/uL (ref 0.7–3.1)
Lymphs: 40 %
MCH: 32 pg (ref 26.6–33.0)
MCHC: 35.7 g/dL (ref 31.5–35.7)
MCV: 90 fL (ref 79–97)
Monocytes Absolute: 0.5 10*3/uL (ref 0.1–0.9)
Monocytes: 9 %
Neutrophils Absolute: 2.4 10*3/uL (ref 1.4–7.0)
Neutrophils: 43 %
Platelets: 284 10*3/uL (ref 150–450)
RBC: 3.63 x10E6/uL — ABNORMAL LOW (ref 3.77–5.28)
RDW: 13.4 % (ref 11.7–15.4)
WBC: 5.4 10*3/uL (ref 3.4–10.8)

## 2022-04-27 LAB — VITAMIN D 25 HYDROXY (VIT D DEFICIENCY, FRACTURES): Vit D, 25-Hydroxy: 36.8 ng/mL (ref 30.0–100.0)

## 2022-04-27 LAB — TSH: TSH: 4.29 u[IU]/mL (ref 0.450–4.500)

## 2022-04-30 ENCOUNTER — Encounter: Payer: Self-pay | Admitting: Family Medicine

## 2022-05-01 DIAGNOSIS — F411 Generalized anxiety disorder: Secondary | ICD-10-CM | POA: Diagnosis not present

## 2022-05-01 DIAGNOSIS — F902 Attention-deficit hyperactivity disorder, combined type: Secondary | ICD-10-CM | POA: Diagnosis not present

## 2022-05-03 ENCOUNTER — Other Ambulatory Visit: Payer: Self-pay | Admitting: *Deleted

## 2022-05-03 DIAGNOSIS — E039 Hypothyroidism, unspecified: Secondary | ICD-10-CM

## 2022-05-03 MED ORDER — SYNTHROID 50 MCG PO TABS: ORAL_TABLET | ORAL | 0 refills | Status: DC

## 2022-05-06 NOTE — Patient Instructions (Incomplete)
I recommend getting the new RSV vaccine. You will need to get this from the pharmacy, and separate it from other vaccines by at least 2 weeks.  We never got the information regarding the date of your last tetanus, which you state was from the [facility].  Please try and get [place] this information so that we will know when your next booster is due.

## 2022-05-06 NOTE — Progress Notes (Signed)
Chief Complaint  Patient presents with   Follow-up    Here to follow up on labs. Has been battling URI for weeks. Still not better. Has appt with [facility] for flu and covid. In two weeks she will get RSV.     Patient presents to follow-up on recent labs.  She hasn't been feeling good since early summer--more fatigue. She hasn't used her CPAP since the summer.  She followed up with her ENT, was referred to an internist in town who specializes in sleep (Dr. Betti Cruz) as she reports she can't get in to see Dr. Tresa Endo.  Her TSH came back borderline high, and she thinks this may be contributing. Continues to have some hair loss (noted in the Spring, more than in the past).  Shedding more, but not really thinning. Nails have been brittle for years. GI issues come and go. She has some "brain fog" feeling, started again over the summer, and feeling depressed and sluggish. She thought it could have been from the Pristiq, so she took herself off of this about 2 months ago. She feels less depressed, fatigue is unchanged.  She is compliant with taking Synthroid daily, and doubling up on Sundays. TSH has been at goal on this regimen for a long time. She denies missed doses.  Component Ref Range & Units 1 yr ago (04/25/21) 1 yr ago (05/23/20) 2 yr ago (09/22/19) 3 yr ago (09/16/18) 4 yr ago (09/12/17)    TSH 0.450 - 4.500 uIU/mL 1.960 1.350 1.270 2.350 3.380      Anxiety/ADD:  under treatment of psych (Dr. Madaline Guthrie). She stopped her Pristiq 2 months ago. Saw her last 2 weeks ago. No changes were made. She continues on Vyvanse 50mg . Pristiq worsened depression, though helped the anxiety.  Hemoglobin dropped from 12.6 to 11.6 on recent labs.  She denies any bleeding, donating blood or other reasons for drop in hemoglobin.  She has fatigue as reported above.  She reports some new dizziness, since her respiratory illness.  She has persistent congestion/mucus.  Cough has improved during the day, still  some cough at night. She was requesting refill on cough medication at night so she could sleep, and was asked to schedule a visit to for re-exam, and to discuss her results. Chart reviewed--she had a virtual visit with Shane on 10/13, and was prescribed Z-pak, albuterol and hycodan syrup (75 mL). She has a lot of mucus, cough at night.  She can't sleep without the hycodan due to coughing spasms. She was taking Mucinex DM (daytime/nighttime, states it is 12 hours), but didn't stop her cough. She is now only taking the DM at night, not taking any Mucinex during the day. Took the daytime version just for the first 2 weeks.  She denies discolored mucus, sinus pain or fever.  Recent labs:  Lab Results  Component Value Date   TSH 4.290 04/26/2022   Lab Results  Component Value Date   WBC 5.4 04/26/2022   HGB 11.6 04/26/2022   HCT 32.5 (L) 04/26/2022   MCV 90 04/26/2022   PLT 284 04/26/2022     Chemistry      Component Value Date/Time   NA 143 04/26/2022 0908   K 4.1 04/26/2022 0908   CL 104 04/26/2022 0908   CO2 25 04/26/2022 0908   BUN 11 04/26/2022 0908   CREATININE 0.61 04/26/2022 0908   CREATININE 0.63 07/05/2016 0724      Component Value Date/Time   CALCIUM 9.2 04/26/2022 0908  ALKPHOS 75 04/26/2022 0908   AST 16 04/26/2022 0908   ALT 10 04/26/2022 0908   BILITOT <0.2 04/26/2022 0908     Glucose 87 Vitamin D-OH 36.8   PMH, PSH, SH reviewed  Outpatient Encounter Medications as of 05/07/2022  Medication Sig Note   cetirizine (ZYRTEC) 10 MG tablet Take 10 mg by mouth daily.    co-enzyme Q-10 30 MG capsule Take 30 mg by mouth 3 (three) times daily.    conjugated estrogens (PREMARIN) vaginal cream Insert 0.5 grams vaginally twice weekly.    eszopiclone 3 MG TABS Take 1 tablet (3 mg total) by mouth at bedtime. Take immediately before bedtime    famotidine (PEPCID) 40 MG tablet Take 1 tablet (40 mg total) by mouth daily.    HYDROcodone bit-homatropine (HYCODAN) 5-1.5  MG/5ML syrup Take 5 mLs by mouth at bedtime as needed for cough.    ibandronate (BONIVA) 150 MG tablet TAKE ONE TABLET ONCE A MONTH IN THE MORNING ON AN EMPTY STOMACH WITH WATER. REMAIN UPRIGHT.    mometasone (NASONEX) 50 MCG/ACT nasal spray Place 2 sprays into the nose daily.    Multiple Vitamin (MULTIVITAMIN) tablet Take 1 tablet by mouth daily.    omeprazole (PRILOSEC) 40 MG capsule TAKE ONE CAPSULE BY MOUTH DAILY BEFORE dinner    Probiotic Product (ALIGN) 4 MG CAPS Take 4 mg by mouth daily.    SYNTHROID 50 MCG tablet TAKE 1 TABLET ONCE DAILY BEFORE BREAKFAST MON THRU SAT, 2 TABLETS ON SUNDAYS    TURMERIC PO Take 1 capsule by mouth daily.    VYVANSE 50 MG capsule Take 50 mg by mouth every morning.    [DISCONTINUED] simvastatin (ZOCOR) 10 MG tablet Take 1 tablet (10 mg total) by mouth at bedtime.    [DISCONTINUED] VYVANSE 30 MG capsule Take 30-60 mg by mouth every morning.    albuterol (VENTOLIN HFA) 108 (90 Base) MCG/ACT inhaler Inhale 2 puffs into the lungs every 6 (six) hours as needed for wheezing or shortness of breath. (Patient not taking: Reported on 05/07/2022) 05/07/2022: prn   ibuprofen (ADVIL) 800 MG tablet Take 1 tablet (800 mg total) by mouth every 8 (eight) hours as needed. (Patient not taking: Reported on 05/07/2022) 10/19/2021: prn   simvastatin (ZOCOR) 10 MG tablet Take 1 tablet (10 mg total) by mouth at bedtime.    [DISCONTINUED] amphetamine-dextroamphetamine (ADDERALL XR) 25 MG 24 hr capsule Take 25 mg by mouth every morning.    [DISCONTINUED] azithromycin (ZITHROMAX) 250 MG tablet 2 tablets day 1, then 1 tablet days 2-4    [DISCONTINUED] desvenlafaxine (PRISTIQ) 50 MG 24 hr tablet Take 50 mg by mouth daily.    [DISCONTINUED] tinidazole (TINDAMAX) 500 MG tablet Take 2 grams po x 1 today and repeat in 24 hours    No facility-administered encounter medications on file as of 05/07/2022.   Allergies  Allergen Reactions   Morphine And Related Other (See Comments)    Family history.    Codeine Itching and Rash    ROS: no fever, chills.  Cough per HPI. Fatigue per HPI. No chest pain, palpitations, nausea, vomiting. Intermittent GI issues, chronic. No changes to hair/skin/nails--some ongoing shedding and chronically brittle nails. Fatigue per HPI. Depression improved.   PHYSICAL EXAM:  BP 120/70   Pulse 64   Ht 4' 10.5" (1.486 m)   Wt 112 lb 6.4 oz (51 kg)   LMP  (LMP Unknown)   BMI 23.09 kg/m   Wt Readings from Last 3 Encounters:  05/07/22 112  lb 6.4 oz (51 kg)  04/13/22 110 lb (49.9 kg)  11/20/21 110 lb 12.8 oz (50.3 kg)   Pleasant female.  There is frequent throat-clearing (nonstop during visit), no coughing. She is speaking easily, comfortably, in no distress HEENT: conjunctiva and sclera are clear, EOMI. TM's and EAC's normal. Nasal mucosa fairly normal, no erythema or purulence. OP with mild erythema posteriorly Neck: no lymphadenopathy, thyromegaly or mass Heart: regular rate and rhythm Lungs: clear bilaterally Abdomen: soft, nontender, no mass Extremities no edema Psych: mood ok (appears tired, frustrated), normal eye contact, speech, grooming Neuro: alert and oriented, cranial nerves intact, normal gait.   ASSESSMENT/PLAN:  Hypothyroidism, unspecified type - There is more risk than benefit to increasing dose; I suspect fatigue is from untreated OSA, not her thyroid. Recheck TSH in 3 mos, cont current dose - Plan: TSH  Drop in hemoglobin - will check iron studies.  If iron deficient, will need to f/u with Dr. Loreta Ave (is already past due for colonoscopy) - Plan: CBC with Differential/Platelet, Iron, TIBC and Ferritin Panel  Obstructive sleep apnea syndrome - not using CPAP since the summer, same time frame as worsening fatigue. Encouraged to re-try CPAP while awaiting sleep consult from new provider  Fatigue, unspecified type - likely multifactorial.  Previously her brain fog and fatigue improved when CPAP started.  Will check iron, follow CBC  and thyroid. Moods may also contribute - Plan: TSH, CBC with Differential/Platelet  Medication monitoring encounter - Plan: TSH  Subacute cough - recent URI. Clearly has PND, no coughing during visit. Supportive measures reviewed. Risks of narcotics reviewed; will RF hycodan - Plan: HYDROcodone bit-homatropine (HYCODAN) 5-1.5 MG/5ML syrup  Pure hypercholesterolemia - RF needed. Cont low cholesterol diet, reviewed. Lipids to be checked at CPE in the spring. - Plan: simvastatin (ZOCOR) 10 MG tablet  Vaccine counseling - encouraged to get copy of vaccine records from the [facility]--need to update our records and give appropriate vaccines to complete course (Hepatitis A/B)   She has appt at pharmacy tomorrow for Flu shot, COVID booster. She plans to get RSV from pharmacy 2 weeks later.  Never got date of her last tetanus (she states she got at health dept), not in [place].   Immunizations were reviewed with pt--unclear about the HepA and B.  Didn't have last vaccines (because her trip was cancelled due to COVID)  Encouraged her to get vaccine record from Health Dept.  (Likely needs last dose of HepB (unless she was given Heplisav), and Hep A--unsure if she truly got 2 doses, but weren't 6 months apart, need to see vaccine record).    Drink plenty of water. Use lozenges to help prevent throat-clearing and coughing. Use the Mucinex DM 12 hour TWICE daily. Consider trying a short-acting pseudoephedrine to help dry up the nose and lessen the drainage down the throat.  You may not want to use this in the afternoon/evening due to side effects you've had in the past, and hold off on the Vyvanse when using it. Use the cough syrup sparingly.  Re-try the CPAP when you are able to. I agree with seeing a sleep specialist to follow-up. I suspect that your fatigue and brain fog is more likely related to sleep apnea rather than the thyroid.  Let's recheck the thyroid in 3 months, as well as another blood  count. We are checking iron studies today.  If these are abnormal, we will need you to follow up with Dr. Loreta Ave. You are due for routine  colonoscopy regardless.  If there is a new iron deficiency, she may consider upper endoscopy as well.

## 2022-05-07 ENCOUNTER — Ambulatory Visit (INDEPENDENT_AMBULATORY_CARE_PROVIDER_SITE_OTHER): Payer: Medicare PPO | Admitting: Family Medicine

## 2022-05-07 VITALS — BP 120/70 | HR 64 | Ht 58.5 in | Wt 112.4 lb

## 2022-05-07 DIAGNOSIS — R710 Precipitous drop in hematocrit: Secondary | ICD-10-CM | POA: Diagnosis not present

## 2022-05-07 DIAGNOSIS — R5383 Other fatigue: Secondary | ICD-10-CM | POA: Diagnosis not present

## 2022-05-07 DIAGNOSIS — H11151 Pinguecula, right eye: Secondary | ICD-10-CM | POA: Diagnosis not present

## 2022-05-07 DIAGNOSIS — G4733 Obstructive sleep apnea (adult) (pediatric): Secondary | ICD-10-CM

## 2022-05-07 DIAGNOSIS — E039 Hypothyroidism, unspecified: Secondary | ICD-10-CM | POA: Diagnosis not present

## 2022-05-07 DIAGNOSIS — Z5181 Encounter for therapeutic drug level monitoring: Secondary | ICD-10-CM

## 2022-05-07 DIAGNOSIS — H524 Presbyopia: Secondary | ICD-10-CM | POA: Diagnosis not present

## 2022-05-07 DIAGNOSIS — R052 Subacute cough: Secondary | ICD-10-CM

## 2022-05-07 DIAGNOSIS — Z7185 Encounter for immunization safety counseling: Secondary | ICD-10-CM

## 2022-05-07 DIAGNOSIS — H52221 Regular astigmatism, right eye: Secondary | ICD-10-CM | POA: Diagnosis not present

## 2022-05-07 DIAGNOSIS — E7800 Pure hypercholesterolemia, unspecified: Secondary | ICD-10-CM

## 2022-05-07 DIAGNOSIS — H5212 Myopia, left eye: Secondary | ICD-10-CM | POA: Diagnosis not present

## 2022-05-07 MED ORDER — SIMVASTATIN 10 MG PO TABS: 10.0000 mg | ORAL_TABLET | Freq: Every day | ORAL | 1 refills | Status: DC

## 2022-05-07 MED ORDER — HYDROCODONE BIT-HOMATROP MBR 5-1.5 MG/5ML PO SOLN: 5.0000 mL | Freq: Every evening | ORAL | 0 refills | Status: DC | PRN

## 2022-05-08 ENCOUNTER — Other Ambulatory Visit: Payer: Self-pay

## 2022-05-08 ENCOUNTER — Encounter: Payer: Self-pay | Admitting: Family Medicine

## 2022-05-08 ENCOUNTER — Encounter (HOSPITAL_BASED_OUTPATIENT_CLINIC_OR_DEPARTMENT_OTHER): Payer: Self-pay

## 2022-05-08 DIAGNOSIS — E039 Hypothyroidism, unspecified: Secondary | ICD-10-CM | POA: Diagnosis not present

## 2022-05-08 DIAGNOSIS — Z85828 Personal history of other malignant neoplasm of skin: Secondary | ICD-10-CM | POA: Insufficient documentation

## 2022-05-08 DIAGNOSIS — R220 Localized swelling, mass and lump, head: Secondary | ICD-10-CM | POA: Diagnosis not present

## 2022-05-08 DIAGNOSIS — Z8616 Personal history of COVID-19: Secondary | ICD-10-CM | POA: Insufficient documentation

## 2022-05-08 LAB — IRON,TIBC AND FERRITIN PANEL
Ferritin: 59 ng/mL (ref 15–150)
Iron Saturation: 16 % (ref 15–55)
Iron: 47 ug/dL (ref 27–139)
Total Iron Binding Capacity: 299 ug/dL (ref 250–450)
UIBC: 252 ug/dL (ref 118–369)

## 2022-05-08 NOTE — ED Notes (Signed)
RT at bedside to evaluate patient. No stridor or throat swelling noted.

## 2022-05-08 NOTE — ED Triage Notes (Signed)
Pt states that she started having tongue swelling an hour ago. Pt denies difficulty breathing or throat swelling. Pt had covid booster and flu shot this morning. Pt has taken 2 benadryl 30 minutes ago.

## 2022-05-08 NOTE — ED Notes (Signed)
RT called to triage to assess. No stridor noted. BBS clear. No distress noted. SpO2 100%

## 2022-05-09 ENCOUNTER — Emergency Department (HOSPITAL_BASED_OUTPATIENT_CLINIC_OR_DEPARTMENT_OTHER)
Admission: EM | Admit: 2022-05-09 | Discharge: 2022-05-09 | Disposition: A | Discharge status: Home / Self Care | Payer: Medicare PPO | Attending: Emergency Medicine | Admitting: Emergency Medicine

## 2022-05-09 DIAGNOSIS — R220 Localized swelling, mass and lump, head: Secondary | ICD-10-CM

## 2022-05-09 MED ORDER — DEXAMETHASONE 4 MG PO TABS
10.0000 mg | ORAL_TABLET | Freq: Once | ORAL | Status: AC
  Administered 2022-05-09: 10 mg via ORAL
  Filled 2022-05-09: qty 3

## 2022-05-09 MED ORDER — EPINEPHRINE 0.3 MG/0.3ML IJ SOAJ: 0.3000 mg | INTRAMUSCULAR | 1 refills | Status: AC | PRN
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
## 2022-05-09 NOTE — Discharge Instructions (Signed)
You were evaluated in the Emergency Department and after careful evaluation, we did not find any emergent condition requiring admission or further testing in the hospital.  Your exam/testing today was overall reassuring.  Symptoms may have been due to allergic reaction to your vaccinations.  Recommend continued Benadryl and/or Pepcid as we discussed.  Going to fill the EpiPen and use as discussed.  Please return to the Emergency Department if you experience any worsening of your condition.  Thank you for allowing [place] to be a part of your care.

## 2022-06-01 ENCOUNTER — Other Ambulatory Visit: Payer: Self-pay | Admitting: Family Medicine

## 2022-06-01 DIAGNOSIS — E039 Hypothyroidism, unspecified: Secondary | ICD-10-CM

## 2022-06-01 NOTE — Telephone Encounter (Signed)
Has an appt in April 

## 2022-06-22 ENCOUNTER — Other Ambulatory Visit: Payer: Self-pay | Admitting: Family Medicine

## 2022-06-22 DIAGNOSIS — K219 Gastro-esophageal reflux disease without esophagitis: Secondary | ICD-10-CM

## 2022-08-21 ENCOUNTER — Other Ambulatory Visit: Payer: Medicare PPO

## 2022-08-23 DIAGNOSIS — F5104 Psychophysiologic insomnia: Secondary | ICD-10-CM | POA: Diagnosis not present

## 2022-08-23 DIAGNOSIS — G4733 Obstructive sleep apnea (adult) (pediatric): Secondary | ICD-10-CM | POA: Diagnosis not present

## 2022-08-23 DIAGNOSIS — G4721 Circadian rhythm sleep disorder, delayed sleep phase type: Secondary | ICD-10-CM | POA: Diagnosis not present

## 2022-08-29 DIAGNOSIS — F411 Generalized anxiety disorder: Secondary | ICD-10-CM | POA: Diagnosis not present

## 2022-08-29 DIAGNOSIS — F419 Anxiety disorder, unspecified: Secondary | ICD-10-CM | POA: Diagnosis not present

## 2022-08-29 DIAGNOSIS — F902 Attention-deficit hyperactivity disorder, combined type: Secondary | ICD-10-CM | POA: Diagnosis not present

## 2022-10-02 ENCOUNTER — Other Ambulatory Visit: Payer: Self-pay | Admitting: Family Medicine

## 2022-10-02 DIAGNOSIS — K219 Gastro-esophageal reflux disease without esophagitis: Secondary | ICD-10-CM

## 2022-10-03 NOTE — Telephone Encounter (Signed)
Left message asking patient if she needs these refilled prior to 10/25/22 appt.

## 2022-10-05 NOTE — Telephone Encounter (Signed)
Left message for pt to call back to see if she needs meds refills until her appt

## 2022-10-18 DIAGNOSIS — S0502XA Injury of conjunctiva and corneal abrasion without foreign body, left eye, initial encounter: Secondary | ICD-10-CM | POA: Diagnosis not present

## 2022-10-24 NOTE — Progress Notes (Unsigned)
Chief Complaint  Patient presents with   Medicare Wellness    Nonfasting AWV/CPE no pap. Had flu/covid in Nov and had an allergic reaction, not sure to whicn one so not sure if she wants another covid today. Has another sleep study in Feb but there was not enough data as she only slept 4hrs, once she is free of allergies she will schedule another one. She has not scheduled colonoscopy, tried to schedule today they did not answer-she will call back. Scheduled DEXA and mammo-in two weeks. No new concerns.     Nicole Allen is a 72 y.o. female who presents for annual physical exam, Medicare wellness visit, follow-up on chronic medical conditions. She has the following concern: She had bumps/rash develop all around her eyes during COVID. She wasn't able to get in with a dermatologist during COVID.  The bumps had all resolved except 2 places on the right eye (one on the upper lid, one on the lower), had been pink/fleshy, but the upper one is now brown. She noticed this last week, thought it was makeup, but it won't come off.  She had an allergic reaction (poss angioedema) after getting flu and COVID vaccines together in November. Treated with steroid, benadryl and pepcid, given epi-pen (didn't need). She has had no further reactions. She prefers to get only 1 vaccine at a time, going forward.  She was last seen in 05/2022, at which time she reported not feeling good since early summer, complaining of fatigue. She hadn't used CPAP since summer, couldn't get in with Dr. Tresa Endo, had scheduled to see Dr. Betti Cruz. She reports seeing her in December, and was last seen in 08/2022. She had a sleep study, but didn't sleep enough (only 4 hours, only slept 1 hour on her back, recalls oxygen saturation decreased to 82%).  She was told to have a repeat study, but to try and improve her sleep before doing it again.  She recommended melatonin 2-3 hours prior to bedtime and use light therapy 20-30 mins upon awakening.  Patient reports her sleep has improved.  She is taking melatonin 5mg , got herself off the [place]. She is going to bed at 11:30 to read, asleep with CPAP by 12:30. She gets up at 8:30 am no matter what (even if it took her a long time to get to sleep). She has been feeling a lot better (other than allergies flaring). She still struggles with the mask--hasn't been able to wear it for the last few nights due to allergies  In November, her TSH came back borderline high, and she thought this may be contributing to how poorly she felt. TSH had been at goal for a long time on regimen of taking Synthroid 50 mcg daily, and doubling up on Sundays.  She reported some hair loss (shedding more, not thinning), chronic brittle nails, GI issues come/go, no significant change.  She felt depressed, sluggish, with some brain fog since the summer.  She thought Pristiq was contributing, so she stopped it 2 months prior to that November visit.  She felt less depressed, still fatigued. She was supposed to return for TSH in February (she missed her lab visit). She denies any changes to hair/nails/bowels/moods or weight.   Component Ref Range & Units 6 mo ago (04/26/22) 1 yr ago (04/25/21) 2 yr ago (05/23/20) 3 yr ago (09/22/19) 4 yr ago (09/16/18) 5 yr ago (09/12/17) 5 yr ago (04/10/17)  TSH 0.450 - 4.500 uIU/mL 4.290 1.960 1.350 1.270 2.350 3.380 1.40  R    Anxiety/ADD: She stopped her Pristiq in September, felt it worsened her depression, but helped anxiety.  She stopped taking Vyvanse in Dec or January (after meeting with Dr. Betti Cruz, in order to try and help with her sleep).  She last saw Dr. Madaline Guthrie in February.Marland Kitchen  "It did not go well", she didn't remember things about her meds. Sleep is better, but ADD is not controlled.  Eventually she plans to explore other treatments (possibly with another doctor) once sleep is figured out.   Hemoglobin had dropped from 12.6 to 11.6 on October labs. She denied any bleeding,  donating blood or other reasons for drop in hemoglobin.  She had felt fatigued, had slight dizziness related to respiratory symptoms/congestion at that time.  She was supposed to have labs repeated in February (didn't). Denies any bleeding.   Component Ref Range & Units 6 mo ago (04/26/22) 1 yr ago (08/21/21) 1 yr ago (04/25/21) 2 yr ago (05/23/20) 3 yr ago (09/22/19) 4 yr ago (09/16/18) 5 yr ago (09/12/17)  WBC 3.4 - 10.8 x10E3/uL 5.4 5.6 R 5.4 5.7 5.5 5.8 5.8  RBC 3.77 - 5.28 x10E6/uL 3.63 Low  4.19 R 4.14 4.41 4.13 4.25 4.31  Hemoglobin 11.1 - 15.9 g/dL 40.9 81.1 R 91.4 78.2 95.6 12.6 12.7  Hematocrit 34.0 - 46.6 % 32.5 Low  36.3 R 36.0 37.9 35.5 36.0 37.3  MCV 79 - 97 fL 90 86.6 R 87 86 86 85 87  MCH 26.6 - 33.0 pg 32.0 30.1 R 30.2 29.5 30.3 29.6 29.5  MCHC 31.5 - 35.7 g/dL 21.3 08.6 R 57.8 46.9 62.9 35.0 34.0  RDW 11.7 - 15.4 % 13.4 13.1 R 13.2 13.2 13.2 13.2 14.0 R  Platelets 150 - 450 x10E3/uL 284 210 R 235 257 239 230 204 R    Hyperlipidemia:  She was tried off simvastatin in the past (she was only on very low dose, tried without--still high, so then we tried treating mild hypothyroidism, but lipids didn't change). Went back on 10mg  of simvastatin (given family h/o CAD).   She saw Dr. Dietrich Pates in October 2017 for consult, who sent her for Ca score CT, which showed Ca score of 0, no evidence of calcified plaque. Review of her recommendations stated that she could cut back on simvastatin to 5mg , follow lipids, could stop simvastatin. Marland Kitchen   LDL was 124 in 09/2020, when on 10mg  simvastatin. We had discussed that with her excellent HDL and calcium score of 0, that LDL up to 160 would likely be acceptable, so she could do another trial off the statin if she was careful with her dietary cholesterol intake.  LDL off medication was 206, total cholesterol of 300; 10mg  simvastatin was restarted, and last LDL was 147 in 09/2021.  Due for recheck. She denies any changes to her diet since the  last check.  She continues to follow a low cholesterol diet. Doesn't eat red meat, has cheese (3x/wk), avoids mayonnaise, eats eggs once a week. She gave up chocolate and anything with preservatives, as these flare her GI issues.   Lab Results  Component Value Date   CHOL 239 (H) 10/18/2021   HDL 74 10/18/2021   LDLCALC 147 (H) 10/18/2021   TRIG 102 10/18/2021   CHOLHDL 3.2 10/18/2021    Allergies:  Using mometasone and zyrtec every night. She ran out of mometasone a few nights ago, has been using an old bottle of flonase instead.  Allergies are flaring--sniffling, PND, slight cough,  watery eyes. Hasn't been able to use CPAP for the last few nights (she starts with it, but can't breathe and ends up taking it off).   GERD: She continues to take omeprazole 40mg  before dinner, and pepcid in the morning, per Dr. Loreta Ave (after having flares of GERD not adequately controlled with omeprazole alone). This, along with cutting out preservatives and chocolate, has been effective.  Denies dysphagia.  Osteopenia:  Last DEXA 08/2019-- T-2.1 at R femoral neck, -2.3 at L femoral neck, T-1.9 at spine, and -2.1 L total femur. FRAX score was 18% and 4.1% for hip fracture. Boniva was started in 12/2019. She was due for recheck in 12/2021. She has DEXA scheduled for next month. No side effects, no dysphagia. She is compliant, but might take it a week late sometimes. Carries heavy things with gardening, no other weight-bearing exercise.  Immunization History  Administered Date(s) Administered   COVID-19, mRNA, vaccine(Comirnaty)12 years and older 05/08/2022   Fluad Quad(high Dose 65+) 04/13/2019, 05/23/2020, 05/08/2022   Hep A / Hep B 05/08/2018   Hepatitis A 06/12/2018   Hepatitis B 06/12/2018   Influenza Split 04/23/2011   Influenza, High Dose Seasonal PF 07/11/2016, 04/10/2017, 03/27/2018   Influenza, Seasonal, Injecte, Preservative Fre 06/09/2012   Influenza,inj,Quad PF,6+ Mos 04/22/2014, 04/14/2015    Influenza-Unspecified 04/11/2021   Moderna Covid-19 Vaccine Bivalent Booster 46yrs & up 04/11/2021   Moderna SARS-COV2 Booster Vaccination 10/13/2020   Moderna Sars-Covid-2 Vaccination 08/05/2019, 09/02/2019, 05/04/2020   Pneumococcal Conjugate-13 07/11/2016   Pneumococcal Polysaccharide-23 09/16/2017   Tdap 12/26/2005   Typhoid Inactivated 05/08/2018   Zoster Recombinat (Shingrix) 05/08/2018, 07/10/2018   Zoster, Live 05/14/2011   She got vaccines through health dept (travel clinic) for trip to [place] (that was cancelled). [facility] reviewed--no tetanus documented as given then. Others have been abstracted into chart and noted above. She reports being sure that she got a tetanus shot then.  Last Pap smear: 07/2018, normal.  Last mammogram: 08/2019 Last colonoscopy: 12/2011, small polyp. She called and was told that her next colonoscopy was due 2023, not 5 years (criteria changed) Last DEXA:  08/2019  T-2.1 R fem neck, -2.3 L fem neck, -1.9 spine, -2.1 L total femur. FRAX 18% and 4.1% Ophtho: yearly  Dentist: every 6 months   Exercise:  sporadic.  Some yardwork  Vitamin D was 62 in 09/2016   Patient Care Team: Joselyn Arrow, MD as PCP - General (Family Medicine) Lennette Bihari, MD as PCP - Cardiology (Cardiology) Lennette Bihari, MD as Consulting Physician (Cardiology) Jerene Bears, MD as Consulting Physician (Obstetrics and Gynecology) Blima Ledger, OD (Optometry) Charna Sula, MD as Consulting Physician (Gastroenterology) Elmon Else, MD as Consulting Physician (Dermatology) Salvatore Marvel, MD as Consulting Physician (Orthopedic Surgery) [facility] Osborn Coho, MD (Inactive) as Consulting Physician (Otolaryngology) Phillip Heal, MD as Referring Physician (Psychiatry) Ophtho: Dr. Elmer Picker (and Dr. Blima Ledger as Optometrist) Psychologist: Monia Sabal ([facility])--no longer sees Dentist: Dr. Francesca Jewett [facility]) Sleep Med: Dr. Betti Cruz [facility])   Depression  Screening:     10/25/2022    1:41 PM 11/20/2021   10:05 AM 10/19/2021    1:43 PM 08/22/2021   10:25 AM 10/13/2020    2:00 PM  Depression screen PHQ 2/9  Decreased Interest 0 0 0 0 1  Down, Depressed, Hopeless  0 0 0 2  PHQ - 2 Score 0 0 0 0 3  Altered sleeping   2  0  Tired, decreased energy   2  2  Change in appetite   0  0  Feeling bad or failure about yourself    0  0  Trouble concentrating   3  1  Moving slowly or fidgety/restless   0  2  Suicidal thoughts   0  0  PHQ-9 Score   7  8  Difficult doing work/chores   Not difficult at all  Very difficult    Falls screen:     10/25/2022    1:41 PM 10/19/2021    1:38 PM 09/05/2021   10:24 AM 10/13/2020    1:49 PM 09/24/2019    8:48 AM  Fall Risk   Falls in the past year? 0 0 0 1 0  Number falls in past yr: 0 0 0 0   Injury with Fall? 0 0 0 0   Comment    fell up the steps 2 months ago and caught herself w/left hand   Risk for fall due to : No Fall Risks No Fall Risks No Fall Risks No Fall Risks   Follow up Falls evaluation completed Falls evaluation completed Falls evaluation completed Falls evaluation completed      Functional Status Survey: Is the patient deaf or have difficulty hearing?: Yes (hearing evaulation soon) Does the patient have difficulty seeing, even when wearing glasses/contacts?: Yes Does the patient have difficulty concentrating, remembering, or making decisions?: Yes (age related memory) Does the patient have difficulty walking or climbing stairs?: No Does the patient have difficulty dressing or bathing?: No Does the patient have difficulty doing errands alone such as visiting a doctor's office or shopping?: No  Mini-Cog Scoring: 5   End of Life Discussion:  Patient has a living will and medical power of attorney. She has been asked to get copies to [place] (not yet received)    PMH, PSH ,SH and FH were reviewed and updated.  Outpatient Encounter Medications as of 10/25/2022  Medication Sig Note   cetirizine  (ZYRTEC) 10 MG tablet Take 10 mg by mouth daily.    co-enzyme Q-10 30 MG capsule Take 30 mg by mouth 3 (three) times daily.    conjugated estrogens (PREMARIN) vaginal cream Insert 0.5 grams vaginally twice weekly.    famotidine (PEPCID) 40 MG tablet TAKE ONE TABLET BY MOUTH DAILY    ibandronate (BONIVA) 150 MG tablet TAKE ONE TABLET ONCE A MONTH IN THE MORNING ON AN EMPTY STOMACH WITH WATER. REMAIN UPRIGHT.    Magnesium 500 MG CAPS Take 1 capsule by mouth daily.    melatonin 5 MG TABS Take 5 mg by mouth at bedtime.    mometasone (NASONEX) 50 MCG/ACT nasal spray Place 2 sprays into the nose daily.    Multiple Vitamin (MULTIVITAMIN) tablet Take 1 tablet by mouth daily.    omeprazole (PRILOSEC) 40 MG capsule TAKE ONE CAPSULE BY MOUTH DAILY BEFORE dinner    Probiotic Product (ALIGN) 4 MG CAPS Take 4 mg by mouth daily.    simvastatin (ZOCOR) 10 MG tablet Take 1 tablet (10 mg total) by mouth at bedtime.    SYNTHROID 50 MCG tablet TAKE 1 TABLET ONCE DAILY BEFORE BREAKFAST MON THRU SAT, 2 TABLETS ON SUNDAYS    TURMERIC PO Take 1 capsule by mouth daily.    [DISCONTINUED] VYVANSE 50 MG capsule Take 50 mg by mouth every morning.    albuterol (VENTOLIN HFA) 108 (90 Base) MCG/ACT inhaler Inhale 2 puffs into the lungs every 6 (six) hours as needed for wheezing or shortness of breath. (Patient not  taking: Reported on 05/07/2022) 10/25/2022: prn   EPINEPHrine 0.3 mg/0.3 mL IJ SOAJ injection Inject 0.3 mg into the muscle as needed for anaphylaxis. (Patient not taking: Reported on 10/25/2022) 10/25/2022: prn   [DISCONTINUED] eszopiclone 3 MG TABS Take 1 tablet (3 mg total) by mouth at bedtime. Take immediately before bedtime    [DISCONTINUED] HYDROcodone bit-homatropine (HYCODAN) 5-1.5 MG/5ML syrup Take 5 mLs by mouth at bedtime as needed for cough.    [DISCONTINUED] ibuprofen (ADVIL) 800 MG tablet Take 1 tablet (800 mg total) by mouth every 8 (eight) hours as needed. (Patient not taking: Reported on 05/07/2022)  10/19/2021: prn   No facility-administered encounter medications on file as of 10/25/2022.   Allergies  Allergen Reactions   Morphine And Related Other (See Comments)    Family history.   Codeine Itching and Rash    ROS: The patient denies fever, weight changes, headaches,ear pain, sore throat, breast concerns, chest pain, palpitations, dizziness, syncope, dyspnea on exertion, cough, swelling, nausea, vomiting, diarrhea, constipation, abdominal pain, melena, hematochezia, hematuria, incontinence, dysuria, vaginal bleeding, discharge, odor or itch, genital lesions, numbness, tingling, weakness, tremor, abnormal bleeding/bruising, or enlarged lymph nodes.   Insomnia is controlled with improved sleep hygiene and melatonin. Hearing loss and tinnitus--uses hearing aids Bilateral knee pain, previously has gotten injections in the knees prior to vacations (strenuous hiking/climbing) from Dr. Thurston Hole.  Has mild daily knee pain, mainly on the right (h/o torn ACL). A brace helps when hiking. Reflux controlled with meds/diet, per HPI (since cutting out chocolate, preservatives) Anxiety/depression per HPI, improved Skin concern L eyelid per HPI    PHYSICAL EXAM:  BP 120/68   Pulse 80   Ht 4\' 10"  (1.473 m)   Wt 111 lb (50.3 kg)   LMP  (LMP Unknown)   BMI 23.20 kg/m   Wt Readings from Last 3 Encounters:  10/25/22 111 lb (50.3 kg)  05/08/22 110 lb (49.9 kg)  05/07/22 112 lb 6.4 oz (51 kg)    General Appearance:   Alert, cooperative, pleasant patient in no distress; appears stated age. Frequent sniffling and cough during visit.  Head:    Normocephalic, without obvious abnormality, atraumatic     Eyes:    PERRL, conjunctiva/corneas clear, EOM's intact, fundi benign. Left upper eyelid, laterally, there is a <45mm slightly raised, light brown, round lesion.  Brown color is uniform--slight blue speck noted inferiorly.   Ears:    Normal TM's and external ear canals     Nose:    slight irritation of  right septum, clear mucus present. Mucosa mild-mod edematous. No erythema or purulence, sinuses nontender  Throat:    Normal mucosa, no lesions or erythema  Neck:    Supple, no lymphadenopathy; thyroid: no enlargement/tenderness/nodules; no carotid   bruit or JVD     Back:    Spine nontender, no curvature, ROM normal, no CVA tenderness     Lungs:    Clear to auscultation bilaterally without wheezes, rales or ronchi; respirations unlabored     Chest Wall:    No tenderness or deformity     Heart:    Regular rate and rhythm, S1 and S2 normal, no murmur, rub or gallop     Breast Exam:    Deferred to GYN     Abdomen:    Soft, non-tender, nondistended, normoactive bowel sounds, no masses,  no hepatosplenomegaly     Genitalia:    Deferred to GYN            Extremities:  No clubbing, cyanosis or edema.   Pulses:    2+ and symmetric all extremities     Skin:    Skin color, texture, turgor normal. Skin on extremities is very tanned  Lymph nodes:    Cervical and supraclavicular, inguinal nodes normal     Neurologic:    Normal strength, sensation and gait; reflexes 2+ and symmetric throughout                Psych:    Normal mood, affect, hygiene, grooming, eye contact and speech.    ASSESSMENT/PLAN:  Annual physical exam  Medicare annual wellness visit, subsequent  OSA (obstructive sleep apnea) - under the care of Dr. Betti Cruz. Discussed short-term use of Afrin at night to help tolerate CPAP. Plans for a f/u sleep study  Hypothyroidism, unspecified type - due for recheck. Euthyroid by history. Last TSH was borderline  Pure hypercholesterolemia - cont low cholesterol diet and statin.  Labs due, will return for fasting labs - Plan: Lipid panel  Gastroesophageal reflux disease, unspecified whether esophagitis present - controlled on current regimen. Cont meds and proper diet  Osteopenia of necks of both femurs - due for DEXA. Encouraged weight-bearing exercise. Discussed Ca, D. Continue  Boniva  Medication monitoring encounter - Plan: Lipid panel  Non-seasonal allergic rhinitis, unspecified trigger - worse seasonally, flaring now. Reviewed proper technique (septum irritated). Cont nasal steroid, antihistamine, saline - Plan: mometasone (NASONEX) 50 MCG/ACT nasal spray  Skin lesion - L upper eyelid.  Residual "bump" from a rash, recently noted to have pigmentation.  Overall appears benign. To schedule with derm and monitor closely  simvastatin and Synthroid refilled after labs reviewed. GERD meds filled x 90d earlier this month  Discussed monthly self breast exams and yearly mammograms (past due, scheduled); at least 30 minutes of aerobic activity at least 5 days/week, weight-bearing exercise 2x/wk; proper sunscreen use reviewed; healthy diet, including goals of calcium and vitamin D intake and alcohol recommendations (less than or equal to 1 drink/day) reviewed; regular seatbelt use; changing batteries in smoke detectors.  Immunization recommendations discussed--no record of tetanus given from [facility].  She believes it was given, possibly missed in putting in [place], when got other travel vaccines (2019). Consider getting booster from pharmacy, since there is no record. Continue yearly high dose flu shots.  RSV vaccine recommended in the Fall (to get 2 weeks apart from flu shot, given reaction when she got 2 vaccines together last Fall). Consider COVID booster. Discussed Prevnar-20 (she is eligible, is >5 years from other pneumonia vaccines) Colonoscopy recommendations reviewed--past due, to schedule with Dr. Loreta Ave DEXA is past due, scheduled next month at [facility].   Full Code, Full Care. MOST form updated. Reminded patient to get [place] copies of her living will and healthcare power of attorney.   Medicare Attestation I have personally reviewed: The patient's medical and social history Their use of alcohol, tobacco or illicit drugs Their current medications and supplements The patient's  functional ability including ADLs,fall risks, home safety risks, cognitive, and hearing and visual impairment Diet and physical activities Evidence for depression or mood disorders  The patient's weight, height, BMI have been recorded in the chart.  I have made referrals, counseling, and provided education to the patient based on review of the above and I have provided the patient with a written personalized care plan for preventive services.

## 2022-10-24 NOTE — Patient Instructions (Incomplete)
  HEALTH MAINTENANCE RECOMMENDATIONS:  It is recommended that you get at least 30 minutes of aerobic exercise at least 5 days/week (for weight loss, you may need as much as 60-90 minutes). This can be any activity that gets your heart rate up. This can be divided in 10-15 minute intervals if needed, but try and build up your endurance at least once a week.  Weight bearing exercise is also recommended twice weekly.  Eat a healthy diet with lots of vegetables, fruits and fiber.  "Colorful" foods have a lot of vitamins (ie green vegetables, tomatoes, red peppers, etc).  Limit sweet tea, regular sodas and alcoholic beverages, all of which has a lot of calories and sugar.  Up to 1 alcoholic drink daily may be beneficial for women (unless trying to lose weight, watch sugars).  Drink a lot of water.  Calcium recommendations are 1200-1500 mg daily (1500 mg for postmenopausal women or women without ovaries), and vitamin D 1000 IU daily.  This should be obtained from diet and/or supplements (vitamins), and calcium should not be taken all at once, but in divided doses.  Monthly self breast exams and yearly mammograms for women over the age of 70 is recommended.  Sunscreen of at least SPF 30 should be used on all sun-exposed parts of the skin when outside between the hours of 10 am and 4 pm (not just when at beach or pool, but even with exercise, golf, tennis, and yard work!)  Use a sunscreen that says "broad spectrum" so it covers both UVA and UVB rays, and make sure to reapply every 1-2 hours.  Remember to change the batteries in your smoke detectors when changing your clock times in the spring and fall. Carbon monoxide detectors are recommended for your home.  Use your seat belt every time you are in a car, and please drive safely and not be distracted with cell phones and texting while driving.   Nicole Allen , Thank you for taking time to come for your Medicare Wellness Visit. I appreciate your ongoing  commitment to your health goals. Please review the following plan we discussed and let me know if I can assist you in the future.   This is a list of the screening recommended for you and due dates:  Health Maintenance  Topic Date Due   DTaP/Tdap/Td vaccine (2 - Td or Tdap) 12/27/2015   Mammogram  09/16/2021   Colon Cancer Screening  12/18/2021   COVID-19 Vaccine (6 - 2023-24 season) 03/02/2022   Medicare Annual Wellness Visit  10/20/2022   Flu Shot  01/31/2023   Pneumonia Vaccine  Completed   DEXA scan (bone density measurement)  Completed   Hepatitis C Screening: USPSTF Recommendation to screen - Ages 58-79 yo.  Completed   Zoster (Shingles) Vaccine  Completed   HPV Vaccine  Aged Out   You are past due for mammogram and bone density tests. Please schedule these with [facility] (they will fax [place] the order for the bone density test).  You are past due for colonoscopy with Dr. Loreta Ave. Please schedule an appointment with her office.  Please bring [place] copies of your Living Will and Healthcare Power of Attorney so that it can be scanned into your medical chart.  I recommend getting the RSV vaccine in the Fall. You get this from the pharmacy. You can get it the same day as your high dose flu shot, or separate them by 2 weeks.

## 2022-10-25 ENCOUNTER — Encounter: Payer: Self-pay | Admitting: Family Medicine

## 2022-10-25 ENCOUNTER — Ambulatory Visit: Payer: Medicare PPO | Admitting: Family Medicine

## 2022-10-25 ENCOUNTER — Telehealth: Payer: Self-pay | Admitting: Family Medicine

## 2022-10-25 VITALS — BP 120/68 | HR 80 | Ht <= 58 in | Wt 111.0 lb

## 2022-10-25 DIAGNOSIS — M85852 Other specified disorders of bone density and structure, left thigh: Secondary | ICD-10-CM

## 2022-10-25 DIAGNOSIS — E7800 Pure hypercholesterolemia, unspecified: Secondary | ICD-10-CM | POA: Diagnosis not present

## 2022-10-25 DIAGNOSIS — K219 Gastro-esophageal reflux disease without esophagitis: Secondary | ICD-10-CM | POA: Diagnosis not present

## 2022-10-25 DIAGNOSIS — E039 Hypothyroidism, unspecified: Secondary | ICD-10-CM

## 2022-10-25 DIAGNOSIS — J3089 Other allergic rhinitis: Secondary | ICD-10-CM | POA: Diagnosis not present

## 2022-10-25 DIAGNOSIS — Z0000 Encounter for general adult medical examination without abnormal findings: Secondary | ICD-10-CM | POA: Diagnosis not present

## 2022-10-25 DIAGNOSIS — L989 Disorder of the skin and subcutaneous tissue, unspecified: Secondary | ICD-10-CM | POA: Diagnosis not present

## 2022-10-25 DIAGNOSIS — M85851 Other specified disorders of bone density and structure, right thigh: Secondary | ICD-10-CM | POA: Diagnosis not present

## 2022-10-25 DIAGNOSIS — G4733 Obstructive sleep apnea (adult) (pediatric): Secondary | ICD-10-CM

## 2022-10-25 DIAGNOSIS — Z5181 Encounter for therapeutic drug level monitoring: Secondary | ICD-10-CM

## 2022-10-25 MED ORDER — MOMETASONE FUROATE 50 MCG/ACT NA SUSP: 2.0000 | Freq: Every day | NASAL | 3 refills | Status: DC

## 2022-10-25 NOTE — Telephone Encounter (Signed)
Pt scheduled her medcheck for 05/01/2023 and wants to know if she needs to have lab work prior and if she didn't have blood work she did not see the point in coming in for a medcheck

## 2022-10-25 NOTE — Telephone Encounter (Signed)
I won't know if she needs any blood tests until she comes for the ones that are being done now (when she returns for fasting labs--she was supposed to have come in February but missed her lab visit). Based on those results, will know what lab tests are needed in October, if any.

## 2022-10-26 ENCOUNTER — Other Ambulatory Visit: Payer: Medicare PPO

## 2022-10-26 DIAGNOSIS — E039 Hypothyroidism, unspecified: Secondary | ICD-10-CM

## 2022-10-26 DIAGNOSIS — R5383 Other fatigue: Secondary | ICD-10-CM | POA: Diagnosis not present

## 2022-10-26 DIAGNOSIS — R710 Precipitous drop in hematocrit: Secondary | ICD-10-CM | POA: Diagnosis not present

## 2022-10-26 DIAGNOSIS — E7800 Pure hypercholesterolemia, unspecified: Secondary | ICD-10-CM | POA: Diagnosis not present

## 2022-10-26 DIAGNOSIS — Z5181 Encounter for therapeutic drug level monitoring: Secondary | ICD-10-CM

## 2022-10-27 ENCOUNTER — Encounter: Payer: Self-pay | Admitting: Family Medicine

## 2022-10-27 LAB — CBC WITH DIFFERENTIAL/PLATELET
Basophils Absolute: 0.1 10*3/uL (ref 0.0–0.2)
Basos: 1 %
EOS (ABSOLUTE): 0.2 10*3/uL (ref 0.0–0.4)
Eos: 3 %
Hematocrit: 36.4 % (ref 34.0–46.6)
Hemoglobin: 12.1 g/dL (ref 11.1–15.9)
Immature Grans (Abs): 0 10*3/uL (ref 0.0–0.1)
Immature Granulocytes: 0 %
Lymphocytes Absolute: 1.4 10*3/uL (ref 0.7–3.1)
Lymphs: 19 %
MCH: 29.2 pg (ref 26.6–33.0)
MCHC: 33.2 g/dL (ref 31.5–35.7)
MCV: 88 fL (ref 79–97)
Monocytes Absolute: 0.6 10*3/uL (ref 0.1–0.9)
Monocytes: 9 %
Neutrophils Absolute: 5.1 10*3/uL (ref 1.4–7.0)
Neutrophils: 68 %
Platelets: 209 10*3/uL (ref 150–450)
RBC: 4.14 x10E6/uL (ref 3.77–5.28)
RDW: 13.4 % (ref 11.7–15.4)
WBC: 7.4 10*3/uL (ref 3.4–10.8)

## 2022-10-27 LAB — LIPID PANEL
Chol/HDL Ratio: 3 ratio (ref 0.0–4.4)
Cholesterol, Total: 215 mg/dL — ABNORMAL HIGH (ref 100–199)
HDL: 71 mg/dL (ref >39)
LDL Chol Calc (NIH): 124 mg/dL — ABNORMAL HIGH (ref 0–99)
Triglycerides: 115 mg/dL (ref 0–149)
VLDL Cholesterol Cal: 20 mg/dL (ref 5–40)

## 2022-10-27 LAB — TSH: TSH: 1.32 u[IU]/mL (ref 0.450–4.500)

## 2022-10-27 MED ORDER — SYNTHROID 50 MCG PO TABS: ORAL_TABLET | ORAL | 11 refills | Status: DC

## 2022-10-27 MED ORDER — SIMVASTATIN 10 MG PO TABS: 10.0000 mg | ORAL_TABLET | Freq: Every day | ORAL | 3 refills | Status: DC

## 2022-10-29 ENCOUNTER — Encounter: Payer: Self-pay | Admitting: Family Medicine

## 2022-10-29 ENCOUNTER — Telehealth: Payer: Medicare PPO | Admitting: Family Medicine

## 2022-10-29 VITALS — Ht <= 58 in | Wt 111.0 lb

## 2022-10-29 DIAGNOSIS — R051 Acute cough: Secondary | ICD-10-CM

## 2022-10-29 DIAGNOSIS — J019 Acute sinusitis, unspecified: Secondary | ICD-10-CM

## 2022-10-29 MED ORDER — AZITHROMYCIN 250 MG PO TABS
ORAL_TABLET | ORAL | 0 refills | Status: DC
Start: 2022-10-29 — End: 2023-05-02

## 2022-10-29 MED ORDER — HYDROCODONE BIT-HOMATROP MBR 5-1.5 MG/5ML PO SOLN
5.0000 mL | Freq: Four times a day (QID) | ORAL | 0 refills | Status: DC | PRN
Start: 2022-10-29 — End: 2023-05-02

## 2022-10-29 NOTE — Patient Instructions (Signed)
  Drink plenty of water. Continue your allergy medications (nasal steroid spray and zyrtec).. Continue to use round the clock guaifenesin. Continue dextromethorphan as needed (either in combination with the guaifenesin, or as a separate Delsym syrup). Take the antibiotics as directed.  Remember that while you only take the zpak for 5 days, it works for 10 days. Contact us on day 5 if worse rather than better, or on day 10 if not completely better. Follow up if you develop fever, worsening cough, shortness of breath, chest pain, pain with breathing, or other new or concerning symptoms. Used sudafed before plane flight.  If evening, and you want to avoid sudafed, you can use Afrin spray prior to takeoff or landing instead.

## 2022-10-29 NOTE — Progress Notes (Signed)
Start time: 11:13 End time: 11:28  Virtual Visit via Video Note  I connected with Nicole Allen on 10/29/22 by a video enabled telemedicine application and verified that I am speaking with the correct person using two identifiers.  Location: Patient: home Provider: office   I discussed the limitations of evaluation and management by telemedicine and the availability of in person appointments. The patient expressed understanding and agreed to proceed.  History of Present Illness:  Chief Complaint  Patient presents with   Cough    VIRTUAL lots of sinus congestion and she is coughing out and blowing thick green mucus. Trouble sleep and feeling miserable. Started with a ST, but is better now. Has a flight to [place] this Wed. And states she needs to get well.     Patient was seen for her physical last week, at which time she  mentioned her allergies flaring.  She reports that her congestion got worse the day after her visit last week.  Cough got worse Friday night.  Saturday she noticed thick green drainage.  She is coughing a lot, affecting her sleep.  She has been taking guaifenesin during the day, along with pseudoephedrine, and at night she takes mucinex nighttime (which has DM). She had leftover hydrocodone syrup from September which is the only thing that helps. She is requesting refill.    She reports that for cough in the past, Delsym and Tessalon have been ineffective, only hydrocodone works.  PMH, PSH, SH reviewed  Outpatient Encounter Medications as of 10/29/2022  Medication Sig Note   cetirizine (ZYRTEC) 10 MG tablet Take 10 mg by mouth daily.    co-enzyme Q-10 30 MG capsule Take 30 mg by mouth 3 (three) times daily.    conjugated estrogens (PREMARIN) vaginal cream Insert 0.5 grams vaginally twice weekly.    famotidine (PEPCID) 40 MG tablet TAKE ONE TABLET BY MOUTH DAILY    guaiFENesin (MUCINEX) 600 MG 12 hr tablet Take 600 mg by mouth 2 (two) times daily. 10/29/2022:  Last dose 9am   HYDROcodone bit-homatropine (HYCODAN) 5-1.5 MG/5ML syrup Take 5 mLs by mouth every 6 (six) hours as needed for cough. 10/29/2022: Old rx-took last night   ibandronate (BONIVA) 150 MG tablet TAKE ONE TABLET ONCE A MONTH IN THE MORNING ON AN EMPTY STOMACH WITH WATER. REMAIN UPRIGHT.    Magnesium 500 MG CAPS Take 1 capsule by mouth daily.    melatonin 5 MG TABS Take 5 mg by mouth at bedtime.    mometasone (NASONEX) 50 MCG/ACT nasal spray Place 2 sprays into the nose daily.    Multiple Vitamin (MULTIVITAMIN) tablet Take 1 tablet by mouth daily.    omeprazole (PRILOSEC) 40 MG capsule TAKE ONE CAPSULE BY MOUTH DAILY BEFORE dinner    Probiotic Product (ALIGN) 4 MG CAPS Take 4 mg by mouth daily.    pseudoephedrine (SUDAFED) 30 MG tablet Take 30 mg by mouth every 4 (four) hours as needed for congestion. 10/29/2022: Last dose 9am   simvastatin (ZOCOR) 10 MG tablet Take 1 tablet (10 mg total) by mouth at bedtime.    SYNTHROID 50 MCG tablet TAKE 1 TABLET ONCE DAILY BEFORE BREAKFAST MON THRU SAT, 2 TABLETS ON SUNDAYS    TURMERIC PO Take 1 capsule by mouth daily.    EPINEPHrine 0.3 mg/0.3 mL IJ SOAJ injection Inject 0.3 mg into the muscle as needed for anaphylaxis. (Patient not taking: Reported on 10/25/2022) 10/25/2022: prn   [DISCONTINUED] albuterol (VENTOLIN HFA) 108 (90 Base) MCG/ACT inhaler Inhale  2 puffs into the lungs every 6 (six) hours as needed for wheezing or shortness of breath. (Patient not taking: Reported on 05/07/2022) 10/25/2022: prn   No facility-administered encounter medications on file as of 10/29/2022.   Allergies  Allergen Reactions   Morphine And Related Other (See Comments)    Family history.   Codeine Itching and Rash   ROS:  URI symptoms per HPI. No f/c, n/v/d. HA at frontal sinuses No shortness of breath. No rash or other concerns.    Observations/Objective:  Ht 4\' 10"  (1.473 m)   Wt 111 lb (50.3 kg)   LMP  (LMP Unknown)   BMI 23.20 kg/m   Well-appearing  visit, with some coughing during visit. She is speaking comfortably, and in no distress. She is alert and oriented. Exam is limited due to the virtual nature of the visit. She points to just above and between her eyebrows as location of headache/pain. Cranial nerves are grossly intact.   Assessment and Plan:  Acute non-recurrent sinusitis, unspecified location - treat with zpak (worked well in November; diarrhea from augmentin). Cont guaif, sudafed.  f/u if sx persist worsen. - Plan: azithromycin (ZITHROMAX) 250 MG tablet  Acute cough - declines tessalon. to use hydrocodone qHS prn, risks/SE reviewed. f/u if sx persist or worsen - Plan: HYDROcodone bit-homatropine (HYCODAN) 5-1.5 MG/5ML syrup  Will not send in substitute if hydrocodone syrup is not available (other meds not effective).   Drink plenty of water. Continue your allergy medications (nasal steroid spray and zyrtec).. Continue to use round the clock guaifenesin. Continue dextromethorphan as needed (either in combination with the guaifenesin, or as a separate Delsym syrup). Take the antibiotics as directed.  Remember that while you only take the zpak for 5 days, it works for 10 days. Contact us on day 5 if worse rather than better, or on day 10 if not completely better. Follow up if you develop fever, worsening cough, shortness of breath, chest pain, pain with breathing, or other new or concerning symptoms. Used sudafed before plane flight.  If evening, and you want to avoid sudafed, you can use Afrin spray prior to takeoff or landing instead.   Follow Up Instructions:    I discussed the assessment and treatment plan with the patient. The patient was provided an opportunity to ask questions and all were answered. The patient agreed with the plan and demonstrated an understanding of the instructions.   The patient was advised to call back or seek an in-person evaluation if the symptoms worsen or if the condition fails to  improve as anticipated.  I spent 20 minutes dedicated to the care of this patient, including pre-visit review of records, face to face time, post-visit ordering of testing and documentation.    Lavonda Jumbo, MD

## 2022-11-09 DIAGNOSIS — Z1231 Encounter for screening mammogram for malignant neoplasm of breast: Secondary | ICD-10-CM | POA: Diagnosis not present

## 2022-11-09 DIAGNOSIS — M8588 Other specified disorders of bone density and structure, other site: Secondary | ICD-10-CM | POA: Diagnosis not present

## 2022-11-09 LAB — HM DEXA SCAN

## 2022-11-09 LAB — HM MAMMOGRAPHY

## 2022-11-12 ENCOUNTER — Encounter: Payer: Self-pay | Admitting: *Deleted

## 2022-11-20 ENCOUNTER — Encounter: Payer: Self-pay | Admitting: Family Medicine

## 2022-11-20 MED ORDER — AMPHETAMINE-DEXTROAMPHET ER 30 MG PO CP24: 30.0000 mg | ORAL_CAPSULE | ORAL | 0 refills | Status: DC

## 2022-11-20 NOTE — Telephone Encounter (Addendum)
Please see the MyChart message reply(ies) for my assessment and plan.    This patient gave consent for this Medical Advice Message and is aware that it may result in a bill to [employer], as well as the possibility of receiving a bill for a co-payment or deductible. They are an established patient, but are not seeking medical advice exclusively about a problem treated during an in person or video visit in the last seven days. I did not recommend an in person or video visit within seven days of my reply.    5/21 10 mins first message--chart review, PDMP review, responding. 7:10pm read response, sent in prescription and replied.  I spent a total of 13 minutes cumulative time within 7 days through [employer].  Lavonda Jumbo, MD

## 2022-12-25 ENCOUNTER — Encounter: Payer: Self-pay | Admitting: Family Medicine

## 2022-12-25 MED ORDER — AMPHETAMINE-DEXTROAMPHET ER 30 MG PO CP24: 30.0000 mg | ORAL_CAPSULE | ORAL | 0 refills | Status: DC

## 2023-01-02 DIAGNOSIS — Z9849 Cataract extraction status, unspecified eye: Secondary | ICD-10-CM | POA: Diagnosis not present

## 2023-01-02 DIAGNOSIS — H5213 Myopia, bilateral: Secondary | ICD-10-CM | POA: Diagnosis not present

## 2023-01-02 DIAGNOSIS — Z961 Presence of intraocular lens: Secondary | ICD-10-CM | POA: Diagnosis not present

## 2023-01-02 DIAGNOSIS — H53143 Visual discomfort, bilateral: Secondary | ICD-10-CM | POA: Diagnosis not present

## 2023-01-02 DIAGNOSIS — H524 Presbyopia: Secondary | ICD-10-CM | POA: Diagnosis not present

## 2023-01-02 DIAGNOSIS — H52221 Regular astigmatism, right eye: Secondary | ICD-10-CM | POA: Diagnosis not present

## 2023-01-09 ENCOUNTER — Encounter: Payer: Self-pay | Admitting: Family Medicine

## 2023-01-09 DIAGNOSIS — E039 Hypothyroidism, unspecified: Secondary | ICD-10-CM

## 2023-01-10 ENCOUNTER — Other Ambulatory Visit: Payer: Self-pay | Admitting: Family Medicine

## 2023-01-10 DIAGNOSIS — K219 Gastro-esophageal reflux disease without esophagitis: Secondary | ICD-10-CM

## 2023-01-10 MED ORDER — SYNTHROID 50 MCG PO TABS
ORAL_TABLET | ORAL | 0 refills | Status: DC
Start: 2023-01-10 — End: 2023-05-02

## 2023-03-28 ENCOUNTER — Other Ambulatory Visit: Payer: Self-pay | Admitting: *Deleted

## 2023-03-28 ENCOUNTER — Encounter: Payer: Self-pay | Admitting: Family Medicine

## 2023-03-28 DIAGNOSIS — Z9189 Other specified personal risk factors, not elsewhere classified: Secondary | ICD-10-CM

## 2023-03-28 MED ORDER — AMPHETAMINE-DEXTROAMPHET ER 30 MG PO CP24: 30.0000 mg | ORAL_CAPSULE | ORAL | 0 refills | Status: DC

## 2023-03-28 MED ORDER — IBANDRONATE SODIUM 150 MG PO TABS
ORAL_TABLET | ORAL | 0 refills | Status: AC
Start: 2023-03-28 — End: ?

## 2023-03-28 MED ORDER — AMPHETAMINE-DEXTROAMPHET ER 25 MG PO CP24: 25.0000 mg | ORAL_CAPSULE | ORAL | 0 refills | Status: DC

## 2023-03-28 NOTE — Telephone Encounter (Signed)
I did the boniva.

## 2023-04-13 ENCOUNTER — Other Ambulatory Visit: Payer: Self-pay | Admitting: Family Medicine

## 2023-04-13 DIAGNOSIS — K219 Gastro-esophageal reflux disease without esophagitis: Secondary | ICD-10-CM

## 2023-04-25 ENCOUNTER — Encounter: Payer: Self-pay | Admitting: Family Medicine

## 2023-04-25 ENCOUNTER — Other Ambulatory Visit: Payer: Self-pay | Admitting: Family Medicine

## 2023-04-25 DIAGNOSIS — K219 Gastro-esophageal reflux disease without esophagitis: Secondary | ICD-10-CM

## 2023-04-26 ENCOUNTER — Telehealth: Payer: Self-pay | Admitting: Family Medicine

## 2023-04-26 NOTE — Telephone Encounter (Signed)
P.A. ADDERALL completed Key: [facility]) Rx #: [id number] Need Help? Call us at [phone] Outcome Additional Information Required Available without authorization. 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

## 2023-05-01 ENCOUNTER — Encounter: Payer: Medicare PPO | Admitting: Family Medicine

## 2023-05-01 NOTE — Progress Notes (Unsigned)
No chief complaint on file.  Patient presents for 6 month follow-up on chronic problems  Fatigue, OSA, insomnia.  Under the care of Dr. Betti Cruz, last seen in 08/2022.  ??UPDATE ***LOOK UP She is compliant with CPAP.  Dr. Betti Cruz recommended melatonin 2-3 hours prior to bedtime and use light therapy 20-30 mins upon awakening. She is taking melatonin 5mg  daily, no longer takes [place]. She is going to bed at 11:30 to read, asleep with CPAP by 12:30. She gets up at 8:30 am no matter what (even if it took her a long time to get to sleep). She has been feeling a lot better (other than allergies flaring). She still struggles with the mask--hasn't been able to wear it for the last few nights due to allergies  She had a sleep study, but didn't sleep enough (only 4 hours, only slept 1 hour on her back, recalls oxygen saturation decreased to 82%).  She was told to have a repeat study, but to try and improve her sleep before doing it again.    HAS SHE HAD REPEATED?  Hypothyroidism: She is compliant with taking Synthroid 50 mcg daily, doubling up on Sundays. She has chronic issues with feeling tired, some hair shedding (not thinning), chronic brittle nails. No GI changes.   Lab Results  Component Value Date   TSH 1.320 10/26/2022      Anxiety/ADD:  She no longer sees Dr. Madaline Guthrie. We have been treating her ADD again.  She was doing well on Adderall 30mg , but had trouble finding that, so switched to the 25 mg that was available. At one point she was treated with Pristiq--which helped her anxiety, but worsened her depression.     Hyperlipidemia:  She was tried off simvastatin in the past (she was only on very low dose, tried without--still high, so then we tried treating mild hypothyroidism, but lipids didn't change). Went back on 10mg  of simvastatin (given family h/o CAD).   She saw Dr. Dietrich Pates in October 2017 for consult, who sent her for Ca score CT, which showed Ca score of 0, no evidence of  calcified plaque. Review of her recommendations stated that she could cut back on simvastatin to 5mg , follow lipids, could stop simvastatin. Marland Kitchen   LDL was 124 in 09/2020, when on 10mg  simvastatin. We had discussed that with her excellent HDL and calcium score of 0, that LDL up to 160 would likely be acceptable, so she could do another trial off the statin if she was careful with her dietary cholesterol intake.  LDL off medication was 206, total cholesterol of 300; 10mg  simvastatin was restarted, and last LDL was 124 in 10/2022.  She denies any changes to her diet since the last check.  She continues to follow a low cholesterol diet.  Doesn't eat red meat, has cheese (3x/wk), avoids mayonnaise, eats eggs once a week.  She gave up chocolate and anything with preservatives, as these flare her GI issues.   Lab Results  Component Value Date   CHOL 215 (H) 10/26/2022   HDL 71 10/26/2022   LDLCALC 124 (H) 10/26/2022   TRIG 115 10/26/2022   CHOLHDL 3.0 10/26/2022      Allergies:  Using mometasone and zyrtec every night.   GERD: She continues to take omeprazole 40mg  before dinner, and pepcid in the morning, per Dr. Loreta Ave (after having flares of GERD not adequately controlled with omeprazole alone). This, along with cutting out preservatives and chocolate, has been effective.  Denies dysphagia.  Osteopenia:  Last DEXA was in 10/2022, T-2.4 L fem neck (stable, slight improvement noted).  She continues on monthly Boniva and denies side effects.   PMH, PSH, SH reviewed   ROS: no fever, chills, URI symptoms. No headaches, dizziness. No chest pain, palpitations, nausea, vomiting. Intermittent GI issues, chronic. No changes to hair/skin/nails--some ongoing shedding and chronically brittle nails. Fatigue per HPI.  Moods Insomnia    PHYSICAL EXAM:  LMP  (LMP Unknown)   Wt Readings from Last 3 Encounters:  10/29/22 111 lb (50.3 kg)  10/25/22 111 lb (50.3 kg)  05/08/22 110 lb (49.9 kg)    Pleasant female, in no distress HEENT: conjunctiva and sclera are clear, EOMI.  Neck: no lymphadenopathy, thyromegaly or mass Heart: regular rate and rhythm Lungs: clear bilaterally Abdomen: soft, nontender, no mass Back: no spinal or CVA tenderness Extremities no edema Psych: normal mood, affect, eye contact, speech, hygiene and grooming Neuro: alert and oriented, cranial nerves intact, normal gait.     ASSESSMENT/PLAN:  ?Synthroid RF needed?  Okay to pend x 6 mos if needed  If not seeing psych (pretty sure she isn't), needs PHQ-9 and GAD-7  C-met (nonfasting)  Flu, COVID--doesn't like to get both together,  She had an allergic reaction (poss angioedema) after getting flu and COVID vaccines together last year  Has she had TdaP from pharmacy???  F/u 6 months for CPE/AWV with fasting labs prior ***ENTER FUTURE ORDERS Cbc, c-met, lipids, TSH   Needs mood dx codes added

## 2023-05-02 ENCOUNTER — Ambulatory Visit (INDEPENDENT_AMBULATORY_CARE_PROVIDER_SITE_OTHER): Payer: Medicare PPO | Admitting: Family Medicine

## 2023-05-02 ENCOUNTER — Encounter: Payer: Self-pay | Admitting: Family Medicine

## 2023-05-02 VITALS — BP 124/68 | HR 80 | Ht <= 58 in | Wt 112.6 lb

## 2023-05-02 DIAGNOSIS — J3089 Other allergic rhinitis: Secondary | ICD-10-CM

## 2023-05-02 DIAGNOSIS — G4733 Obstructive sleep apnea (adult) (pediatric): Secondary | ICD-10-CM

## 2023-05-02 DIAGNOSIS — F988 Other specified behavioral and emotional disorders with onset usually occurring in childhood and adolescence: Secondary | ICD-10-CM | POA: Diagnosis not present

## 2023-05-02 DIAGNOSIS — E7800 Pure hypercholesterolemia, unspecified: Secondary | ICD-10-CM

## 2023-05-02 DIAGNOSIS — K219 Gastro-esophageal reflux disease without esophagitis: Secondary | ICD-10-CM

## 2023-05-02 DIAGNOSIS — M85852 Other specified disorders of bone density and structure, left thigh: Secondary | ICD-10-CM

## 2023-05-02 DIAGNOSIS — E039 Hypothyroidism, unspecified: Secondary | ICD-10-CM | POA: Diagnosis not present

## 2023-05-02 DIAGNOSIS — F411 Generalized anxiety disorder: Secondary | ICD-10-CM | POA: Diagnosis not present

## 2023-05-02 DIAGNOSIS — Z5181 Encounter for therapeutic drug level monitoring: Secondary | ICD-10-CM

## 2023-05-02 DIAGNOSIS — M85851 Other specified disorders of bone density and structure, right thigh: Secondary | ICD-10-CM

## 2023-05-02 DIAGNOSIS — M2550 Pain in unspecified joint: Secondary | ICD-10-CM

## 2023-05-02 MED ORDER — SYNTHROID 50 MCG PO TABS
ORAL_TABLET | ORAL | 1 refills | Status: DC
Start: 2023-05-02 — End: 2023-10-14

## 2023-05-02 NOTE — Patient Instructions (Signed)
I would encourage you to find another psychiatrist to help with medications for your anxiety, as well as finding a good therapist.

## 2023-05-03 DIAGNOSIS — S0502XA Injury of conjunctiva and corneal abrasion without foreign body, left eye, initial encounter: Secondary | ICD-10-CM | POA: Diagnosis not present

## 2023-05-03 LAB — CMP14+EGFR
ALT: 12 [IU]/L (ref 0–32)
AST: 23 [IU]/L (ref 0–40)
Albumin: 4.4 g/dL (ref 3.8–4.8)
Alkaline Phosphatase: 62 [IU]/L (ref 44–121)
BUN/Creatinine Ratio: 22 (ref 12–28)
BUN: 14 mg/dL (ref 8–27)
Bilirubin Total: 0.2 mg/dL (ref 0.0–1.2)
CO2: 23 mmol/L (ref 20–29)
Calcium: 9.5 mg/dL (ref 8.7–10.3)
Chloride: 104 mmol/L (ref 96–106)
Creatinine, Ser: 0.64 mg/dL (ref 0.57–1.00)
Globulin, Total: 2.1 g/dL (ref 1.5–4.5)
Glucose: 96 mg/dL (ref 70–99)
Potassium: 3.9 mmol/L (ref 3.5–5.2)
Sodium: 141 mmol/L (ref 134–144)
Total Protein: 6.5 g/dL (ref 6.0–8.5)
eGFR: 94 mL/min/{1.73_m2} (ref >59)

## 2023-05-12 NOTE — Telephone Encounter (Signed)
 Available without authorization.

## 2023-07-02 ENCOUNTER — Encounter: Payer: Self-pay | Admitting: Family Medicine

## 2023-07-09 NOTE — Progress Notes (Signed)
 Chief Complaint  Patient presents with   Facial Pain    Sinus pain and pressure since the week of Thanksgiving lasted about 2 weeks. Then she had a stomach bug. 24-48hrs. Congestion came back and 3-4 days before Christmas she started feeling better. One week ago one Jan 1 started with ST, now ton of nasal congestion, slight cough. Has not done any home covid tests. Had covid in Sept/Oct. Lots of fatigue and has dizziness when she stands up or changing positions periodically.   Message sent yesterday by patient: I have been sick almost continuously since the day before thanksgiving and have not felt well enough to follow up on anything.  I have had almost constant URI which has left me weak and tired.  I've had off and on sore throat, congestion, coughing etc.  At one point I began to feel better only to then get a stomach bug and when that was over the URI came back.  I'm now in my 3rd iteration of URI since Thanksgiving. I know you don't like to prescribe antibiotics for URIs but I am exhausted and weak. My family and best friend insist I ask you for help.     Also I need a new script for my ADD meds. 30 mg for 90 days. And please let me know what I need to do to restart the cardio referral.  She is frustrated with the frequent illnesses she has had, as described above. She was better for about a week, then last week was sick again. Started with sore throat, and now symptoms are nasal congestion, runny nose, some cough, and fatigue. Less coughing than with her prior respiratory illness.   Drainage started as thick, yellow-green, lately has been clear/white. A few headaches, but no consistent sinus pain. Cough isn't productive (was just a little, initially). No fever, chills, myalgias. Just extremely fatigued. She feels more out of breath with activities, walking up the stairs. Feels very tired with activities (ie shower), to the point where she had to lay down, felt a little nauseated.  She  is reporting some dizziness when she stands up--more often with her current illness. Feels like her equilibrium is off, needs to grab something.  Denies LH or feeling faint. Symptoms are short-lived, <10 seconds.  Usually with standing up from leaning forwards, or from sitting to standing. This happens daily when sick. She has been drinking plenty of water. Denies nausea, vomiting, diarrhea. No indigestion/heartburn.  Taking generic mucinex chest congestion--guaifenesin and dextromethorphan, NOT the nighttime medication in her med list.  It is a 4 hour medication, taking it just at night. Taking 1 (not two) sudafed  (pseudoephedrine) during the day. States she can tolerate this dose along with adderall, though hasn't taken adderall in the last week (ran out). Also took 2 benadryl  last night.   ADD--last filled Adderall XR for 25 mg in September, due to the 30mg  dose not being available. Plans to find a new psychiatrist.  She states she is not sure that the Adderall is working that well anyway--although she does notice things are much worse when she doesn't take it (so knows it is helping some). Requesting refill for the 30mg .  She denies side effects.    PMH, PSH, SH reviewed  Outpatient Encounter Medications as of 07/10/2023  Medication Sig Note   cetirizine (ZYRTEC) 10 MG tablet Take 10 mg by mouth daily.    co-enzyme Q-10 30 MG capsule Take 30 mg by mouth 3 (three) times  daily.    conjugated estrogens  (PREMARIN ) vaginal cream Insert 0.5 grams vaginally twice weekly.    diphenhydrAMINE  (BENADRYL ) 25 mg capsule Take 50 mg by mouth every 6 (six) hours as needed. 07/10/2023: Took 2 last night   DM-Acetaminophen -Triprolidine (MUCINEX NIGHT COLD/FLU MAX STR) 10-325-1.25 MG TABS Take 2 tablets by mouth as needed. 07/10/2023: Took last night   famotidine  (PEPCID ) 40 MG tablet TAKE ONE TABLET BY MOUTH ONCE DAILY    fluticasone  (FLONASE ) 50 MCG/ACT nasal spray Place 1 spray into both nostrils daily.  05/02/2023: Alternates between this and nasonex    ibandronate  (BONIVA ) 150 MG tablet TAKE ONE TABLET ONCE A MONTH IN THE MORNING ON AN EMPTY STOMACH WITH WATER. REMAIN UPRIGHT.    Magnesium 500 MG CAPS Take 1 capsule by mouth daily.    melatonin 5 MG TABS Take 5 mg by mouth at bedtime.    Multiple Vitamin (MULTIVITAMIN) tablet Take 1 tablet by mouth daily.    omeprazole  (PRILOSEC) 40 MG capsule TAKE ONE CAPSULE BY MOUTH DAILY BEFORE dinner    Probiotic Product (ALIGN) 4 MG CAPS Take 4 mg by mouth daily.    simvastatin  (ZOCOR ) 10 MG tablet Take 1 tablet (10 mg total) by mouth at bedtime.    SYNTHROID  50 MCG tablet TAKE 1 TABLET ONCE DAILY BEFORE BREAKFAST MON THRU SAT, 2 TABLETS ON SUNDAYS    TURMERIC PO Take 1 capsule by mouth daily.    zinc  gluconate 50 MG tablet Take 50 mg by mouth daily.    amphetamine -dextroamphetamine (ADDERALL XR) 25 MG 24 hr capsule Take 1 capsule by mouth every morning. (Patient not taking: Reported on 07/10/2023)    amphetamine -dextroamphetamine (ADDERALL XR) 30 MG 24 hr capsule Take 1 capsule (30 mg total) by mouth every morning. (Patient not taking: Reported on 07/10/2023)    EPINEPHrine  0.3 mg/0.3 mL IJ SOAJ injection Inject 0.3 mg into the muscle as needed for anaphylaxis. (Patient not taking: Reported on 07/10/2023) 07/10/2023: As needed   mometasone  (NASONEX ) 50 MCG/ACT nasal spray Place 2 sprays into the nose daily. (Patient not taking: Reported on 07/10/2023)    No facility-administered encounter medications on file as of 07/10/2023.   Also taking pseudoephedrine during the day. Mucinex DM is NOT nighttime, but a 4 hour generic with just these 2 ingredients   Allergies  Allergen Reactions   Morphine And Codeine  Other (See Comments)    Family history.   Codeine  Itching and Rash    ROS: no f/c, occ HA, no sinus pain. Some cough, no shortness of breath. No n/v/heartburn/diarrhea No rashes, bleeding. ++fatigue See HPI.     PHYSICAL EXAM:  BP 110/70   Pulse  80   Temp 98.3 F (36.8 C) (Tympanic)   Ht 4' 11 (1.499 m)   Wt 112 lb 9.6 oz (51.1 kg)   LMP  (LMP Unknown)   BMI 22.74 kg/m   Wt Readings from Last 3 Encounters:  07/10/23 112 lb 9.6 oz (51.1 kg)  05/02/23 112 lb 9.6 oz (51.1 kg)  10/29/22 111 lb (50.3 kg)   Lots of sniffling/snorting during visit. No coughing. Appears slightly tired, but in no distress. She is speaking comfortably, easily. HEENT: conjunctiva and sclera are clear, EOMI. TM's and EAC's normal. Nasal mucosa is only mildly edematous, no erythema. White mucus noted. Sinuses nontender OP thick white mucus noted draining down right posterior pharynx. No erythema. Moist mucus membranes Neck: no lymphadenopathy or mass Heart: regular rate and rhythm, no murmur Lungs: clear bilaterally Skin: normal turgor,  no rash. Neuro: alert and oriented, cranial nerves grossly intact, normal gait.  Negative COVID, influenza A&B tests   ASSESSMENT/PLAN:   Viral upper respiratory illness - reassurred--seems to be improving some. no e/o bacterial infection. no indication for prednisone  or ABX as requested. Supportive measures reviewed  Attention deficit disorder, unspecified type - refill adderall 30mg  (can switch back to 25 if the 30mg  dose not available). She plans to find another psychiatrist to address any med changes - Plan: amphetamine -dextroamphetamine (ADDERALL XR) 30 MG 24 hr capsule  Head congestion - Plan: POC COVID-19, Influenza A/B  Fatigue, unspecified type - Plan: POC COVID-19, Influenza A/B  Pure hypercholesterolemia - had been referred to cardiology per her request to have more in depth discussion re: treatments/risks.  She is still interested in appt, will call to schedule  Referral to cardiology was closed bc she never called them back to schedule. She will call their office, and if new referral needs to be placed, we will do so.  I spent 39 minutes dedicated to the care of this patient, including pre-visit  review of records, face to face time, post-visit ordering of testing and documentation.   Continue to drink plenty of water. Continue to use the Sudafed during the day. You can take according to the package (every 4 hours), but use with caution if taking the adderall along with it.  (If you are feeling very bad, skip the Adderall, and take more of the sudafed, especially if having more dizziness). Consider sinus rinses (sinus rinse kit by [employer] or [employer]), once or twice daily. Add in the guaifenesin and dextromethorphan regularly throughout the day.  Contact us  if you develop fever, sinus pain, or discolored mucus or phlegm, which can indicate a secondary bacterial infection, and needs an antibiotic

## 2023-07-10 ENCOUNTER — Ambulatory Visit: Payer: Medicare PPO | Admitting: Family Medicine

## 2023-07-10 ENCOUNTER — Encounter: Payer: Self-pay | Admitting: Family Medicine

## 2023-07-10 VITALS — BP 110/70 | HR 80 | Temp 98.3°F | Ht 59.0 in | Wt 112.6 lb

## 2023-07-10 DIAGNOSIS — R5383 Other fatigue: Secondary | ICD-10-CM

## 2023-07-10 DIAGNOSIS — J069 Acute upper respiratory infection, unspecified: Secondary | ICD-10-CM | POA: Diagnosis not present

## 2023-07-10 DIAGNOSIS — E7800 Pure hypercholesterolemia, unspecified: Secondary | ICD-10-CM

## 2023-07-10 DIAGNOSIS — F988 Other specified behavioral and emotional disorders with onset usually occurring in childhood and adolescence: Secondary | ICD-10-CM

## 2023-07-10 DIAGNOSIS — R0981 Nasal congestion: Secondary | ICD-10-CM

## 2023-07-10 MED ORDER — AMPHETAMINE-DEXTROAMPHET ER 30 MG PO CP24: 30.0000 mg | ORAL_CAPSULE | ORAL | 0 refills | Status: DC

## 2023-07-10 NOTE — Patient Instructions (Signed)
 Continue to drink plenty of water. Continue to use the Sudafed during the day. You can take according to the package (every 4 hours), but use with caution if taking the adderall along with it.  (If you are feeling very bad, skip the Adderall, and take more of the sudafed, especially if having more dizziness). Consider sinus rinses (sinus rinse kit by [employer] or [employer]), once or twice daily. Add in the guaifenesin and dextromethorphan regularly throughout the day.  Contact us  if you develop fever, sinus pain, or discolored mucus or phlegm, which can indicate a secondary bacterial infection, and needs an antibiotic

## 2023-07-11 LAB — POC COVID19 BINAXNOW: SARS Coronavirus 2 Ag: NEGATIVE

## 2023-07-11 LAB — POCT INFLUENZA A/B
Influenza A, POC: NEGATIVE
Influenza B, POC: NEGATIVE

## 2023-07-15 ENCOUNTER — Other Ambulatory Visit: Payer: Self-pay | Admitting: Family Medicine

## 2023-07-15 DIAGNOSIS — K219 Gastro-esophageal reflux disease without esophagitis: Secondary | ICD-10-CM

## 2023-07-18 DIAGNOSIS — H40042 Steroid responder, left eye: Secondary | ICD-10-CM | POA: Diagnosis not present

## 2023-07-29 ENCOUNTER — Encounter: Payer: Self-pay | Admitting: Family Medicine

## 2023-07-29 ENCOUNTER — Other Ambulatory Visit: Payer: Self-pay | Admitting: Family Medicine

## 2023-07-29 DIAGNOSIS — K219 Gastro-esophageal reflux disease without esophagitis: Secondary | ICD-10-CM

## 2023-07-30 MED ORDER — OSELTAMIVIR PHOSPHATE 75 MG PO CAPS: ORAL_CAPSULE | ORAL | 0 refills | Status: DC

## 2023-08-06 ENCOUNTER — Encounter: Payer: Self-pay | Admitting: Internal Medicine

## 2023-08-06 ENCOUNTER — Telehealth: Payer: Self-pay | Admitting: Internal Medicine

## 2023-08-06 NOTE — Telephone Encounter (Signed)
Pt does not need rx sent today. On 07/10/23 she only received 28 pills and then picked up #62 today to equal her 90 day rx. She will not be due until April.

## 2023-08-06 NOTE — Telephone Encounter (Signed)
 Left message for pt to call me back

## 2023-08-06 NOTE — Telephone Encounter (Signed)
Patient called and needs her Adderall 30mg  sent to [facility]. They are currently holding this medication for her

## 2023-08-06 NOTE — Telephone Encounter (Signed)
What quantity are they holding? She previously requested #90, but not sure what they are holding.  I can get to this later this afternoon, but need to know quant to send

## 2023-10-13 ENCOUNTER — Other Ambulatory Visit: Payer: Self-pay | Admitting: Family Medicine

## 2023-10-13 DIAGNOSIS — K219 Gastro-esophageal reflux disease without esophagitis: Secondary | ICD-10-CM

## 2023-10-13 DIAGNOSIS — E039 Hypothyroidism, unspecified: Secondary | ICD-10-CM

## 2023-10-14 ENCOUNTER — Encounter: Payer: Self-pay | Admitting: Family Medicine

## 2023-10-14 DIAGNOSIS — F988 Other specified behavioral and emotional disorders with onset usually occurring in childhood and adolescence: Secondary | ICD-10-CM

## 2023-10-14 MED ORDER — AMPHETAMINE-DEXTROAMPHET ER 30 MG PO CP24: 30.0000 mg | ORAL_CAPSULE | ORAL | 0 refills | Status: DC

## 2023-10-24 ENCOUNTER — Encounter: Payer: Self-pay | Admitting: Family Medicine

## 2023-10-24 DIAGNOSIS — E039 Hypothyroidism, unspecified: Secondary | ICD-10-CM

## 2023-10-28 ENCOUNTER — Other Ambulatory Visit: Payer: Medicare PPO

## 2023-10-28 DIAGNOSIS — E039 Hypothyroidism, unspecified: Secondary | ICD-10-CM

## 2023-10-28 DIAGNOSIS — E7800 Pure hypercholesterolemia, unspecified: Secondary | ICD-10-CM | POA: Diagnosis not present

## 2023-10-28 DIAGNOSIS — Z5181 Encounter for therapeutic drug level monitoring: Secondary | ICD-10-CM

## 2023-10-28 DIAGNOSIS — M2550 Pain in unspecified joint: Secondary | ICD-10-CM

## 2023-10-29 DIAGNOSIS — F411 Generalized anxiety disorder: Secondary | ICD-10-CM | POA: Insufficient documentation

## 2023-10-29 DIAGNOSIS — E7841 Elevated Lipoprotein(a): Secondary | ICD-10-CM | POA: Insufficient documentation

## 2023-10-29 DIAGNOSIS — G4700 Insomnia, unspecified: Secondary | ICD-10-CM | POA: Insufficient documentation

## 2023-10-29 LAB — CMP14+EGFR
ALT: 17 IU/L (ref 0–32)
AST: 23 IU/L (ref 0–40)
Albumin: 4.2 g/dL (ref 3.8–4.8)
Alkaline Phosphatase: 62 IU/L (ref 44–121)
BUN/Creatinine Ratio: 22 (ref 12–28)
BUN: 14 mg/dL (ref 8–27)
Bilirubin Total: 0.4 mg/dL (ref 0.0–1.2)
CO2: 24 mmol/L (ref 20–29)
Calcium: 9.1 mg/dL (ref 8.7–10.3)
Chloride: 103 mmol/L (ref 96–106)
Creatinine, Ser: 0.65 mg/dL (ref 0.57–1.00)
Globulin, Total: 1.9 g/dL (ref 1.5–4.5)
Glucose: 94 mg/dL (ref 70–99)
Potassium: 4.4 mmol/L (ref 3.5–5.2)
Sodium: 140 mmol/L (ref 134–144)
Total Protein: 6.1 g/dL (ref 6.0–8.5)
eGFR: 93 mL/min/{1.73_m2} (ref >59)

## 2023-10-29 LAB — CBC WITH DIFFERENTIAL/PLATELET
Basophils Absolute: 0.1 10*3/uL (ref 0.0–0.2)
Basos: 2 %
EOS (ABSOLUTE): 0.2 10*3/uL (ref 0.0–0.4)
Eos: 4 %
Hematocrit: 34.4 % (ref 34.0–46.6)
Hemoglobin: 11.5 g/dL (ref 11.1–15.9)
Immature Grans (Abs): 0 10*3/uL (ref 0.0–0.1)
Immature Granulocytes: 0 %
Lymphocytes Absolute: 1.7 10*3/uL (ref 0.7–3.1)
Lymphs: 38 %
MCH: 29.3 pg (ref 26.6–33.0)
MCHC: 33.4 g/dL (ref 31.5–35.7)
MCV: 88 fL (ref 79–97)
Monocytes Absolute: 0.5 10*3/uL (ref 0.1–0.9)
Monocytes: 10 %
Neutrophils Absolute: 2.1 10*3/uL (ref 1.4–7.0)
Neutrophils: 46 %
Platelets: 238 10*3/uL (ref 150–450)
RBC: 3.92 x10E6/uL (ref 3.77–5.28)
RDW: 13.2 % (ref 11.7–15.4)
WBC: 4.6 10*3/uL (ref 3.4–10.8)

## 2023-10-29 LAB — LIPID PANEL
Chol/HDL Ratio: 2.5 ratio (ref 0.0–4.4)
Cholesterol, Total: 208 mg/dL — ABNORMAL HIGH (ref 100–199)
HDL: 83 mg/dL (ref >39)
LDL Chol Calc (NIH): 110 mg/dL — ABNORMAL HIGH (ref 0–99)
Triglycerides: 86 mg/dL (ref 0–149)
VLDL Cholesterol Cal: 15 mg/dL (ref 5–40)

## 2023-10-29 LAB — LIPOPROTEIN A (LPA): Lipoprotein (a): 191.6 nmol/L — ABNORMAL HIGH (ref <75.0)

## 2023-10-29 LAB — T4, FREE: Free T4: 1.66 ng/dL (ref 0.82–1.77)

## 2023-10-29 LAB — SEDIMENTATION RATE: Sed Rate: 3 mm/h (ref 0–40)

## 2023-10-29 LAB — TSH: TSH: 1.55 u[IU]/mL (ref 0.450–4.500)

## 2023-10-29 LAB — HIGH SENSITIVITY CRP: CRP, High Sensitivity: 2.15 mg/L (ref 0.00–3.00)

## 2023-10-29 LAB — T3: T3, Total: 100 ng/dL (ref 71–180)

## 2023-10-29 NOTE — Progress Notes (Signed)
 Chief Complaint  Patient presents with   Medicare Wellness    Nonfasting AWV/CPE. Has not yet seen cardiology, will call to schedule. Dr. Kieran Pellet left practice-will have to call and see if anyone is replacing. Will also call to schedule colonoscopy. Had flu shot in fall, ok for prevnar.     Nicole Allen is a 73 y.o. female who presents for annual physical exam, Medicare wellness visit, follow-up on chronic medical conditions. See below for labs done prior to visit. She had sent a message prior to getting her labs done asking for specific additional thyroid  tests.  She never read my reply, and is surprised today that the antibodies weren't done (and didn't recall that any had been done in the past.)  See below, under thyroid  section.  Hyperlipidemia:  She was tried off simvastatin  in the past, as she was only on very low dose; cholesterol was high when not taking it, went back on 10 mg simvastatin  due to family h/o CAD). She later saw Dr. Ola Berger in October 2017 for consult, who sent her for Ca score CT, which showed Ca score of 0, no evidence of calcified plaque. Review of her recommendations stated that she could cut back on simvastatin  to 5mg , follow lipids, could stop simvastatin . Aaron Aas   LDL was 124 in 09/2020, when on 10mg  simvastatin . We had discussed that with her excellent HDL and calcium  score of 0, that LDL up to 160 would likely be acceptable, so she could do another trial off the statin if she was careful with her dietary cholesterol intake.  LDL off medication was 206, total cholesterol of 300; 10mg  simvastatin  was restarted; LDL was 124 in 10/2022. At her October visit, she discussed concerns about statin, family history. She was referred back to cardiology (she didn't want to go back to Dr. Avanell Bob).  They sent her a letter 12/31 stating they had been unable to reach her. She never scheduled the appointment.  She continues to follow a low cholesterol diet.  Doesn't eat red meat, has  cheese (3x/wk), avoids mayonnaise, eats eggs once a week.  She continues to limit chocolate and anything with preservatives, as these flare her GI issues.   Component Ref Range & Units (hover) 1 d ago (10/28/23) 1 yr ago (10/26/22) 2 yr ago (10/18/21) 2 yr ago (04/25/21) 3 yr ago (10/13/20) 4 yr ago (09/22/19) 5 yr ago (09/16/18)  Cholesterol, Total 208 High  215 High  239 High  300 High  222 High  202 High  227 High   Triglycerides 86 115 102 121 110 107 107  HDL 83 71 74 73 79 70 79  VLDL Cholesterol Cal 15 20 18 21 19 19 21   LDL Chol Calc (NIH) 110 High  124 High  147 High  206 High  124 High  113 High    Chol/HDL Ratio 2.5 3.0 CM 3.2 CM 4.1 CM 2.8 CM 2.9 CM 2.9 CM     Fatigue, OSA, insomnia.  She had been under the care of Dr. Kieran Pellet, but reports today that she has left the practice.  She hasn't followed up with [facility] yet.  She had done very well with the regimen per Dr. Sharlene Dawn a specifically recommended melatonin 2-3 hours before bedtime, and waking at 8:30 every day, regardless of when she got to sleep. She hadn't needed any Lunesta , had been falling asleep fine most of the time. At 04/2023 visit she mentioned she heard there was [place] shortage of the  specific melatonin that was recommended by Dr. Kieran Pellet, and she was anxious about this. She didn't contact her to see if she had a second preferred melatonin.  She is currently using a different melatonin (different method of production from [employer]), doesn't find it helpful.  Her sleep is now "terrible".  Still trying to keep a structured routine.  She is sleeping much worse, sometimes only getting 2 hours/night, and on those days she will NOT get up at 8:30. She generally tries to get up at 8:30, and often operates only on 4-5 hours/sleep. She has OSA and hasn't been using CPAP.  She had great reponse to it when she first started using it, but not since.   She had a sleep study with Dr. Kieran Pellet, but didn't sleep enough (only 4 hours,  only slept 1 hour on her back, recalls oxygen saturation decreased to 82%).  She was told to have a repeat study, but to try and improve her sleep before doing it again.  She never had a repeat sleep study done.    Hypothyroidism: She is compliant with taking Synthroid  50 mcg daily, doubling up on Sundays. She has chronic issues with feeling tired (related to poor sleep, but reports fatigue even when sleep was better), some hair shedding (not thinning), chronic brittle nails. No GI changes.  She sent the following message recently: "Based on my continuing to have symptoms often associated with low thyroid  (fatigue, weight gain, low energy, brain fog, dry skin, hair loss and sensitivity to extremes in temperature) even though I have been faithfully taking Synthroid  for years, I would like to have additional lab tests done." She requested antibody testing and T3 and T4. She was advised she previously had a negative TPO antibody in 10/2013, and explained why I didn't feel the others were indicated. Offered referral to endo if she had further concerns.  She reports that she has done a lot of reading, about different potential treatments.  She declined referral to endo for further discussion.   Component Ref Range & Units (hover) 1 d ago (10/28/23) 1 yr ago (10/26/22) 1 yr ago (04/26/22) 2 yr ago (04/25/21) 3 yr ago (05/23/20) 4 yr ago (09/22/19) 5 yr ago (09/16/18)  TSH 1.550 1.320 4.290 1.960 1.350 1.270 2.350    Anxiety/ADD:  She no longer sees Dr. Garnet Just. We have been treating her ADD again.  She was doing well on Adderall 30mg , but had trouble finding that dose, so switched to the 25 mg that was available. She did okay at that dose.  She has been back on the 30 mg. Last refilled #90 on 4/14. "I don't think it is working anymore". Willing to stay on the same dose.  Doesn't think that her poor sleep is a factor, since she has been dealing with ADD for so many years.  Feels very overwhelmed.  At  one point she was treated with Pristiq (by Dr. Stefanie Edinger helped her anxiety, but worsened her depression. She has remained off medications for anxiety/depression, and moods are up and down.   Not currently getting therapy or seeing psychiatrist.  She previously reported having a hard time finding a therapist that is a good fit. She wants someone good with Adult ADD and plans to look into this. She denies desiring any change in medication today.    Allergies:  Using Flonase  and zyrtec every night. Periodically uses  mometasone  and then switches back.   GERD: She continues to take omeprazole  40mg  before dinner, and  pepcid  in the morning, per Dr. Tova Fresh (after having flares of GERD not adequately controlled with omeprazole  alone). This regimen, along with limiting preservatives and chocolate, has been effective.  Denies dysphagia.   Osteopenia:  Last DEXA was in 10/2022, T-2.4 L fem neck (stable, slight improvement noted).  She continues on monthly Boniva  and denies side effects. This has been held since January due to dental work.  She will finish up with dental work in June and restart when cleared by dentist. Carries heavy things with gardening, no other weight-bearing exercise.    Immunization History  Administered Date(s) Administered   Fluad Quad(high Dose 65+) 04/13/2019, 05/23/2020, 05/08/2022   Hep A / Hep B 05/08/2018   Hepatitis A 06/12/2018   Hepatitis B 06/12/2018   Influenza Split 04/23/2011   Influenza, High Dose Seasonal PF 07/11/2016, 04/10/2017, 03/27/2018, 04/02/2023   Influenza, Seasonal, Injecte, Preservative Fre 06/09/2012   Influenza,inj,Quad PF,6+ Mos 04/22/2014, 04/14/2015   Influenza-Unspecified 04/11/2021   Moderna Covid-19 Vaccine Bivalent Booster 96yrs & up 04/11/2021   Moderna SARS-COV2 Booster Vaccination 10/13/2020   Moderna Sars-Covid-2 Vaccination 08/05/2019, 09/02/2019, 05/04/2020   Pfizer(Comirnaty)Fall Seasonal Vaccine 12 years and older 05/08/2022    Pneumococcal Conjugate-13 07/11/2016   Pneumococcal Polysaccharide-23 09/16/2017   Tdap 12/26/2005   Typhoid Inactivated 05/08/2018   Zoster Recombinant(Shingrix) 05/08/2018, 07/10/2018   Zoster, Live 05/14/2011   She got vaccines through health dept (travel clinic) for trip to [place] (that was cancelled). [facility] reviewed--no tetanus documented as given then. Others have been abstracted into chart and noted above. She reports being sure that she got a tetanus shot then (2019).  Last Pap smear: 07/2018, normal.  Due to see GYN Last mammogram: 10/2022 Last colonoscopy: 12/2011, small polyp. She called and was told that her next colonoscopy was due 2023, not 5 years (criteria changed).  She hasn't contacted GI office to schedule. Last DEXA:  10/2022, T-2.4 L fem neck (stable, slight improvement noted) Ophtho: yearly  Dentist: every 6 months   Exercise:  yardwork, nothing else regularly.  Plans to see a trainer.  Vitamin D  was 36.8 in 04/2022    Patient Care Team: Roosvelt Colla, MD as PCP - General (Family Medicine) Millicent Ally, MD as PCP - Cardiology (Cardiology) Millicent Ally, MD as Consulting Physician (Cardiology) Lillian Rein, MD as Consulting Physician (Obstetrics and Gynecology) Princella Brooklyn, OD (Optometry) Tami Falcon, MD as Consulting Physician (Gastroenterology) Thais Fill, MD as Consulting Physician (Dermatology) Elly Habermann, MD as Consulting Physician (Orthopedic Surgery[facility] Ammon Bales, MD (Inactive) as Consulting Physician (Otolaryngology) Viveca Grist, MD as Referring Physician (Psychiatry) Ophtho: Dr. Lasandra Points (and Dr. Princella Brooklyn as Optometrist) Psychologist: Amelie Jury ([facility])--no longer sees Dentist: Dr. Devonda Folds [facility]) Sleep Med: Dr. Kieran Pellet ([facility])--no longer works there   Depression Screening:     10/30/2023    2:01 PM 05/02/2023   11:44 AM 10/25/2022    1:41 PM 11/20/2021   10:05 AM 10/19/2021    1:43 PM   Depression screen PHQ 2/9  Decreased Interest 0 0 0 0 0  Down, Depressed, Hopeless 0 0  0 0  PHQ - 2 Score 0 0 0 0 0  Altered sleeping 2 2   2   Tired, decreased energy 2 2   2   Change in appetite 0 0   0  Feeling bad or failure about yourself  0 0   0  Trouble concentrating 3 1   3   Moving slowly or fidgety/restless 0  0   0  Suicidal thoughts 0 0   0  PHQ-9 Score 7 5   7   Difficult doing work/chores Not difficult at all Not difficult at all   Not difficult at all      10/30/2023    2:02 PM 05/02/2023   11:42 AM 10/13/2020    2:28 PM 05/23/2020   12:08 PM  GAD 7 : Generalized Anxiety Score  Nervous, Anxious, on Edge 0 3 3 2   Control/stop worrying 0 2 3 3   Worry too much - different things 1 1 3 3   Trouble relaxing 0 1 3 2   Restless 3 3 2 2   Easily annoyed or irritable 2 2 3 2   Afraid - awful might happen 0 1 3 3   Total GAD 7 Score 6 13 20 17   Anxiety Difficulty Not difficult at all Not difficult at all      Falls screen:     10/30/2023    1:59 PM 10/25/2022    1:41 PM 10/19/2021    1:38 PM 09/05/2021   10:24 AM 10/13/2020    1:49 PM  Fall Risk   Falls in the past year? 0 0 0 0 1  Number falls in past yr: 0 0 0 0 0  Injury with Fall? 0 0 0 0 0  Comment     fell up the steps 2 months ago and caught herself w/left hand  Risk for fall due to : No Fall Risks No Fall Risks No Fall Risks No Fall Risks No Fall Risks  Follow up Falls evaluation completed Falls evaluation completed Falls evaluation completed Falls evaluation completed Falls evaluation completed     Functional Status Survey: Is the patient deaf or have difficulty hearing?: Yes (wears B/L hearing) Does the patient have difficulty seeing, even when wearing glasses/contacts?: No Does the patient have difficulty concentrating, remembering, or making decisions?: No Does the patient have difficulty walking or climbing stairs?: No Does the patient have difficulty dressing or bathing?: No Does the patient have difficulty  doing errands alone such as visiting a doctor's office or shopping?: No  Mini-Cog Scoring: 5   End of Life Discussion:  Patient has a living will and medical power of attorney. She has been asked to get copies to us  (not yet received)    PMH, PSH ,SH and FH were reviewed and updated.  Outpatient Encounter Medications as of 10/30/2023  Medication Sig Note   amphetamine -dextroamphetamine (ADDERALL XR) 30 MG 24 hr capsule Take 1 capsule (30 mg total) by mouth every morning.    cetirizine (ZYRTEC) 10 MG tablet Take 10 mg by mouth daily.    co-enzyme Q-10 30 MG capsule Take 30 mg by mouth 3 (three) times daily.    conjugated estrogens  (PREMARIN ) vaginal cream Insert 0.5 grams vaginally twice weekly.    famotidine  (PEPCID ) 40 MG tablet TAKE ONE TABLET BY MOUTH ONCE DAILY    fluticasone  (FLONASE ) 50 MCG/ACT nasal spray Place 1 spray into both nostrils daily. 10/30/2023: Alternates between this and nasonex     Magnesium 500 MG CAPS Take 1 capsule by mouth daily.    melatonin 5 MG TABS Take 5 mg by mouth at bedtime.    mometasone  (NASONEX ) 50 MCG/ACT nasal spray Place 2 sprays into the nose daily. 10/30/2023: Alternates between this and nasonex     Multiple Vitamin (MULTIVITAMIN) tablet Take 1 tablet by mouth daily.    omeprazole  (PRILOSEC) 40 MG capsule TAKE ONE CAPSULE BY MOUTH DAILY BEFORE dinner  Probiotic Product (ALIGN) 4 MG CAPS Take 4 mg by mouth daily.    simvastatin  (ZOCOR ) 10 MG tablet Take 1 tablet (10 mg total) by mouth at bedtime.    SYNTHROID  50 MCG tablet TAKE 1 TABLET ONCE DAILY BEFORE BREAKFAST MON THRU SAT, 2 TABLETS ON SUNDAYS    TURMERIC PO Take 1 capsule by mouth daily.    [DISCONTINUED] zinc  gluconate 50 MG tablet Take 50 mg by mouth daily.    diphenhydrAMINE  (BENADRYL ) 25 mg capsule Take 50 mg by mouth every 6 (six) hours as needed. (Patient not taking: Reported on 10/30/2023) 10/30/2023: As needed   EPINEPHrine  0.3 mg/0.3 mL IJ SOAJ injection Inject 0.3 mg into the muscle as  needed for anaphylaxis. (Patient not taking: Reported on 10/25/2022) 10/30/2023: As needed   ibandronate  (BONIVA ) 150 MG tablet TAKE ONE TABLET ONCE A MONTH IN THE MORNING ON AN EMPTY STOMACH WITH WATER. REMAIN UPRIGHT. (Patient not taking: Reported on 10/30/2023) 10/30/2023: On hold due to dental work   [DISCONTINUED] DM-Acetaminophen -Triprolidine (MUCINEX NIGHT COLD/FLU MAX STR) 10-325-1.25 MG TABS Take 2 tablets by mouth as needed. 07/10/2023: Took last night   [DISCONTINUED] oseltamivir  (TAMIFLU ) 75 MG capsule Take 1 pill by mouth once daily for a week for prevention, or one pill twice daily for 5 days to treat the flu    No facility-administered encounter medications on file as of 10/30/2023.   Allergies  Allergen Reactions   Morphine And Codeine  Other (See Comments)    Family history.   Codeine  Itching and Rash     ROS: The patient denies fever, weight changes, headaches, ear pain, sore throat, breast concerns, chest pain, palpitations, dizziness, syncope, dyspnea on exertion, cough, swelling, nausea, vomiting, diarrhea, constipation, abdominal pain, melena, hematochezia, hematuria, incontinence, dysuria, vaginal bleeding, discharge, odor or itch, genital lesions, numbness, tingling, weakness, tremor, abnormal bleeding/bruising, or enlarged lymph nodes.    Insomnia per HPI. Hearing loss and tinnitus--uses hearing aids Bilateral knee pain, previously has gotten injections in the knees prior to vacations (strenuous hiking/climbing) from Dr. Jinger Mount.  A brace to R knee helps when hiking. Only occasional pain with daily activities "annoying". Reflux controlled with meds/diet, per HPI Anxiety/depression per HPI--main issue is concentration and ADD, and poor sleep. Fatigue per HPI. Ongoing dental work. Holding Boniva . Denies any pain. Congestion/allergies flaring.    PHYSICAL EXAM:  BP 128/78   Pulse 88   Ht 4\' 11"  (1.499 m)   Wt 110 lb 6.4 oz (50.1 kg)   LMP  (LMP Unknown)   BMI 22.30  kg/m   Wt Readings from Last 3 Encounters:  10/30/23 110 lb 6.4 oz (50.1 kg)  07/10/23 112 lb 9.6 oz (51.1 kg)  05/02/23 112 lb 9.6 oz (51.1 kg)    General Appearance:   Alert, cooperative, pleasant patient in no distress; appears stated age. Frequent throat-clearing during visit.  Head:    Normocephalic, without obvious abnormality, atraumatic     Eyes:    PERRL, conjunctiva/corneas clear, EOM's intact, fundi benign.  Ears:    Normal TM's and external ear canals     Nose:    No drainage, sinuses nontender. Mild edema, no erythema or purulence  Throat:    Normal mucosa, no lesions or erythema  Neck:    Supple, no lymphadenopathy; thyroid : no enlargement/tenderness/nodules; no carotid bruit or JVD     Back:    Spine nontender, no curvature, ROM normal, no CVA tenderness     Lungs:    Clear to auscultation bilaterally without  wheezes, rales or ronchi; respirations unlabored     Chest Wall:    No tenderness or deformity     Heart:    Regular rate and rhythm, S1 and S2 normal, no murmur, rub or gallop     Breast Exam:    Deferred to GYN     Abdomen:    Soft, non-tender, nondistended, normoactive bowel sounds, no masses,  no hepatosplenomegaly     Genitalia:    Deferred to GYN            Extremities:    No clubbing, cyanosis or edema.   Pulses:    2+ and symmetric all extremities     Skin:    Skin color, texture, turgor normal.   Lymph nodes:    Cervical and supraclavicular nodes normal     Neurologic:    Normal strength, sensation and gait; reflexes 2+ and symmetric throughout                Psych:    Normal mood, affect, hygiene, grooming, eye contact and speech.   Lab Results  Component Value Date   CHOL 208 (H) 10/28/2023   HDL 83 10/28/2023   LDLCALC 110 (H) 10/28/2023   TRIG 86 10/28/2023   CHOLHDL 2.5 10/28/2023   Component Ref Range & Units (hover) 1 d ago  CRP, High Sensitivity 2.15   Component Ref Range & Units (hover) 1 d ago  Lipoprotein (a) 191.6 High    Lab  Results  Component Value Date   TSH 1.550 10/28/2023   Component Ref Range & Units (hover) 1 d ago  Free T4 1.66   Component Ref Range & Units (hover) 1 d ago  T3, Total 100   Lab Results  Component Value Date   ESRSEDRATE 3 10/28/2023     Chemistry      Component Value Date/Time   NA 140 10/28/2023 0819   K 4.4 10/28/2023 0819   CL 103 10/28/2023 0819   CO2 24 10/28/2023 0819   BUN 14 10/28/2023 0819   CREATININE 0.65 10/28/2023 0819   CREATININE 0.63 07/05/2016 0724      Component Value Date/Time   CALCIUM  9.1 10/28/2023 0819   ALKPHOS 62 10/28/2023 0819   AST 23 10/28/2023 0819   ALT 17 10/28/2023 0819   BILITOT 0.4 10/28/2023 0819     Fasting glucose 94  Lab Results  Component Value Date   WBC 4.6 10/28/2023   HGB 11.5 10/28/2023   HCT 34.4 10/28/2023   MCV 88 10/28/2023   PLT 238 10/28/2023     ASSESSMENT/PLAN:  Annual physical exam  Medicare annual wellness visit, subsequent  Gastroesophageal reflux disease Assessment & Plan: Adequately controlled on current regimen and diet. Continue. No dysphagia or red flag symptoms  Orders: -     Famotidine ; Take 1 tablet (40 mg total) by mouth daily.  Dispense: 90 tablet; Refill: 3  Pure hypercholesterolemia Assessment & Plan: Has been well controlled on low dose simvastatin , with prior calcium  score of zero. Repeat calcium  scan.  Has elevated lipoprotein (a).  Encouraged she follow up with cardiologist (as discussed in October, and also in light of elevated lp(a) for recommendations. She is asymptomatic  Orders: -     Simvastatin ; Take 1 tablet (10 mg total) by mouth at bedtime.  Dispense: 90 tablet; Refill: 1 -     CT CARDIAC SCORING (SELF PAY ONLY); Future  Hypothyroidism, unspecified type Assessment & Plan: At goal on current dose of  medication.  Discussed that antibodies are academic for the cause, not impacting the treatment. Discussed other causes of her symptoms, much of which can be attributed  to poor sleep and untreated sleep apnea (brain fog, fatigue, concentration issues; discussed some changes related to menopause, ie hair).  Offered referral to endo to further address her concerns (and see if they felt that additional testing was indicated), but she declined.   Attention deficit disorder, unspecified type Assessment & Plan: She feels this is poorly controlled. Not interested in changing meds or seeing psychiatrist. Will look into a therapist who is knowledgeable about adult ADD. We discussed the impact of poor sleep in concentration   Osteopenia of necks of both femurs Assessment & Plan: Encouraged adequate calcium , vitamin D  and regular weight-bearing exercise. She plans to start seeing a trainer.  She has been off of the Boniva  related to dental work, and will restart when cleared by dentist. She tolerates this without side effects.   OSA (obstructive sleep apnea) Assessment & Plan: Currently not using CPAP (due to complaints of nasal congestion). Strongly encouraged her to f/u with [facility]. Briefly mentioned risks of untreated OSA, in addition to how it affects her symptoms. Briefly reviewed proper sleep hygiene measures.    Generalized anxiety disorder Assessment & Plan: Overall doing okay, not on treatment   Elevated lipoprotein(a) Assessment & Plan: Cont current statin for now; unsure if it should be changed or intensified (my understanding is that it doesn't lower lp(a), but can help decrease risks.  Calcium  score will also affect recommendations.  Encouraged to f/u with cardiology, as previously discussed, schedule visit after she gets the calcium  score done.  Orders: -     CT CARDIAC SCORING (SELF PAY ONLY); Future  Insomnia, unspecified type Assessment & Plan: Encouraged her to f/u [facility] sleep [facility], as discussed above. Encouraged regular exercise and good sleep hygiene   Need for pneumococcal 20-valent conjugate vaccination -      Pneumococcal conjugate vaccine 20-valent    Discussed monthly self breast exams and yearly mammograms; at least 30 minutes of aerobic activity at least 5 days/week, weight-bearing exercise 2x/wk; proper sunscreen use reviewed; healthy diet, including goals of calcium  and vitamin D  intake and alcohol recommendations (less than or equal to 1 drink/day) reviewed; regular seatbelt use; changing batteries in smoke detectors.  Immunization recommendations discussed--no record of tetanus given from [facility].  She believes it was given, possibly missed in putting in [place], when got other travel vaccines (2019). Consider getting booster from pharmacy, since there is no record.  Continue yearly high dose flu shots.  RSV vaccine discussed--can get in the Fall, or wait until age 39. Discussed COVID booster recommendations (recommended q6 mos for age >48). Prevnar-20 given today. Colonoscopy recommendations reviewed--past due, to schedule with Dr. Tova Fresh.  Reminded to schedule mammogram, appointment with Dr. Tova Fresh, appointment with [facility], and cardiologist. Offered referral to endo, declined. Looking into finding a therapist who is helpful with adult ADD.   Full Code, Full Care. MOST form updated. Reminded patient to get us  copies of her living will and healthcare power of attorney.  Face to face time of this visit was 70 minutes, plus additional 20 minutes in pre-visit review, documentation, orders. Most of this was related to non-wellness issues (thyroid , lipids, ADD, insomnia, fatigue, etc).   Medicare Attestation I have personally reviewed: The patient's medical and social history Their use of alcohol, tobacco or illicit drugs Their current medications and supplements The patient's  functional ability including ADLs,fall risks, home safety risks, cognitive, and hearing and visual impairment Diet and physical activities Evidence for depression or mood disorders  The patient's weight, height,  BMI have been recorded in the chart.  I have made referrals, counseling, and provided education to the patient based on review of the above and I have provided the patient with a written personalized care plan for preventive services.

## 2023-10-29 NOTE — Patient Instructions (Incomplete)
 HEALTH MAINTENANCE RECOMMENDATIONS:  It is recommended that you get at least 30 minutes of aerobic exercise at least 5 days/week (for weight loss, you may need as much as 60-90 minutes). This can be any activity that gets your heart rate up. This can be divided in 10-15 minute intervals if needed, but try and build up your endurance at least once a week.  Weight bearing exercise is also recommended twice weekly.  Eat a healthy diet with lots of vegetables, fruits and fiber.  "Colorful" foods have a lot of vitamins (ie green vegetables, tomatoes, red peppers, etc).  Limit sweet tea, regular sodas and alcoholic beverages, all of which has a lot of calories and sugar.  Up to 1 alcoholic drink daily may be beneficial for women (unless trying to lose weight, watch sugars).  Drink a lot of water.  Calcium  recommendations are 1200-1500 mg daily (1500 mg for postmenopausal women or women without ovaries), and vitamin D  1000 IU daily.  This should be obtained from diet and/or supplements (vitamins), and calcium  should not be taken all at once, but in divided doses.  Monthly self breast exams and yearly mammograms for women over the age of 25 is recommended.  Sunscreen of at least SPF 30 should be used on all sun-exposed parts of the skin when outside between the hours of 10 am and 4 pm (not just when at beach or pool, but even with exercise, golf, tennis, and yard work!)  Use a sunscreen that says "broad spectrum" so it covers both UVA and UVB rays, and make sure to reapply every 1-2 hours.  Remember to change the batteries in your smoke detectors when changing your clock times in the spring and fall. Carbon monoxide detectors are recommended for your home.  Use your seat belt every time you are in a car, and please drive safely and not be distracted with cell phones and texting while driving.   Nicole Allen , Thank you for taking time to come for your Medicare Wellness Visit. I appreciate your ongoing  commitment to your health goals. Please review the following plan we discussed and let me know if I can assist you in the future.   This is a list of the screening recommended for you and due dates:  Health Maintenance  Topic Date Due   DTaP/Tdap/Td vaccine (2 - Td or Tdap) 12/27/2015   Colon Cancer Screening  12/18/2021   COVID-19 Vaccine (7 - 2024-25 season) 03/03/2023   Flu Shot  01/31/2024   Medicare Annual Wellness Visit  10/29/2024   Mammogram  11/08/2024   Pneumonia Vaccine  Completed   DEXA scan (bone density measurement)  Completed   Hepatitis C Screening  Completed   Zoster (Shingles) Vaccine  Completed   HPV Vaccine  Aged Out   Meningitis B Vaccine  Aged Out   High dose flu shots are recommended every Fall.  We discussed RSV vaccine--to consider getting in the Fall vs waiting until age 69.  COVID boosters are recommended every 6 months for age >14. Consider getting one now, or waiting for the updated one when it becomes available in the Fall.  Consider getting another tetanus booster (TdaP) from the pharmacy--You believe you got one in 2019, but there isn't any documentation of this.  With it now being >5-6 years even if done, it  might be worth getting one now (no harm in getting a little early, if you did truly have it).   Please bring us  copies  of your Living Will and Healthcare Power of Attorney so that it can be scanned into your medical chart.  You are past due for your colonoscopy.  Please contact Dr. Haidee Lev office.  I recommend that you schedule the appointment with the cardiologist. We had put in a referral in October, and they sent you a letter in December after being unable to reach you.  I think it is a good idea to see them.

## 2023-10-30 ENCOUNTER — Ambulatory Visit: Payer: Medicare PPO | Admitting: Family Medicine

## 2023-10-30 ENCOUNTER — Encounter: Payer: Self-pay | Admitting: Family Medicine

## 2023-10-30 VITALS — BP 128/78 | HR 88 | Ht 59.0 in | Wt 110.4 lb

## 2023-10-30 DIAGNOSIS — K219 Gastro-esophageal reflux disease without esophagitis: Secondary | ICD-10-CM | POA: Diagnosis not present

## 2023-10-30 DIAGNOSIS — M85852 Other specified disorders of bone density and structure, left thigh: Secondary | ICD-10-CM

## 2023-10-30 DIAGNOSIS — M85851 Other specified disorders of bone density and structure, right thigh: Secondary | ICD-10-CM

## 2023-10-30 DIAGNOSIS — Z23 Encounter for immunization: Secondary | ICD-10-CM

## 2023-10-30 DIAGNOSIS — Z0000 Encounter for general adult medical examination without abnormal findings: Secondary | ICD-10-CM

## 2023-10-30 DIAGNOSIS — E7841 Elevated Lipoprotein(a): Secondary | ICD-10-CM

## 2023-10-30 DIAGNOSIS — F988 Other specified behavioral and emotional disorders with onset usually occurring in childhood and adolescence: Secondary | ICD-10-CM

## 2023-10-30 DIAGNOSIS — G4733 Obstructive sleep apnea (adult) (pediatric): Secondary | ICD-10-CM

## 2023-10-30 DIAGNOSIS — E039 Hypothyroidism, unspecified: Secondary | ICD-10-CM

## 2023-10-30 DIAGNOSIS — F411 Generalized anxiety disorder: Secondary | ICD-10-CM

## 2023-10-30 DIAGNOSIS — E7800 Pure hypercholesterolemia, unspecified: Secondary | ICD-10-CM | POA: Diagnosis not present

## 2023-10-30 DIAGNOSIS — G4700 Insomnia, unspecified: Secondary | ICD-10-CM

## 2023-10-30 MED ORDER — FAMOTIDINE 40 MG PO TABS: 40.0000 mg | ORAL_TABLET | Freq: Every day | ORAL | 3 refills | Status: AC

## 2023-10-30 MED ORDER — SIMVASTATIN 10 MG PO TABS: 10.0000 mg | ORAL_TABLET | Freq: Every day | ORAL | 1 refills | Status: DC

## 2023-10-31 ENCOUNTER — Encounter: Payer: Self-pay | Admitting: Family Medicine

## 2023-10-31 NOTE — Assessment & Plan Note (Signed)
 At goal on current dose of medication.  Discussed that antibodies are academic for the cause, not impacting the treatment. Discussed other causes of her symptoms, much of which can be attributed to poor sleep and untreated sleep apnea (brain fog, fatigue, concentration issues; discussed some changes related to menopause, ie hair).  Offered referral to endo to further address her concerns (and see if they felt that additional testing was indicated), but she declined.

## 2023-10-31 NOTE — Assessment & Plan Note (Signed)
 Overall doing okay, not on treatment

## 2023-10-31 NOTE — Assessment & Plan Note (Signed)
 Encouraged adequate calcium , vitamin D  and regular weight-bearing exercise. She plans to start seeing a trainer.  She has been off of the Boniva  related to dental work, and will restart when cleared by dentist. She tolerates this without side effects.

## 2023-10-31 NOTE — Assessment & Plan Note (Signed)
 She feels this is poorly controlled. Not interested in changing meds or seeing psychiatrist. Will look into a therapist who is knowledgeable about adult ADD. We discussed the impact of poor sleep in concentration

## 2023-10-31 NOTE — Assessment & Plan Note (Signed)
 Cont current statin for now; unsure if it should be changed or intensified (my understanding is that it doesn't lower lp(a), but can help decrease risks.  Calcium  score will also affect recommendations.  Encouraged to f/u with cardiology, as previously discussed, schedule visit after she gets the calcium  score done.

## 2023-10-31 NOTE — Assessment & Plan Note (Signed)
 Has been well controlled on low dose simvastatin , with prior calcium  score of zero. Repeat calcium  scan.  Has elevated lipoprotein (a).  Encouraged she follow up with cardiologist (as discussed in October, and also in light of elevated lp(a) for recommendations. She is asymptomatic

## 2023-10-31 NOTE — Assessment & Plan Note (Signed)
 Currently not using CPAP (due to complaints of nasal congestion). Strongly encouraged her to f/u with [facility]. Briefly mentioned risks of untreated OSA, in addition to how it affects her symptoms. Briefly reviewed proper sleep hygiene measures.

## 2023-10-31 NOTE — Assessment & Plan Note (Signed)
 Encouraged her to f/u [facility], as discussed above. Encouraged regular exercise and good sleep hygiene

## 2023-10-31 NOTE — Assessment & Plan Note (Signed)
 Adequately controlled on current regimen and diet. Continue. No dysphagia or red flag symptoms

## 2023-11-06 ENCOUNTER — Ambulatory Visit (HOSPITAL_BASED_OUTPATIENT_CLINIC_OR_DEPARTMENT_OTHER): Admitting: Cardiology

## 2023-11-06 ENCOUNTER — Encounter (HOSPITAL_BASED_OUTPATIENT_CLINIC_OR_DEPARTMENT_OTHER): Payer: Self-pay | Admitting: Cardiology

## 2023-11-06 VITALS — BP 112/68 | HR 91 | Ht 59.0 in | Wt 112.0 lb

## 2023-11-06 DIAGNOSIS — E7800 Pure hypercholesterolemia, unspecified: Secondary | ICD-10-CM

## 2023-11-06 DIAGNOSIS — Z712 Person consulting for explanation of examination or test findings: Secondary | ICD-10-CM | POA: Diagnosis not present

## 2023-11-06 DIAGNOSIS — Z8249 Family history of ischemic heart disease and other diseases of the circulatory system: Secondary | ICD-10-CM | POA: Diagnosis not present

## 2023-11-06 DIAGNOSIS — E7841 Elevated Lipoprotein(a): Secondary | ICD-10-CM

## 2023-11-06 DIAGNOSIS — Z7189 Other specified counseling: Secondary | ICD-10-CM | POA: Diagnosis not present

## 2023-11-06 NOTE — Patient Instructions (Signed)
 Medication Instructions:  Your physician recommends that you continue on your current medications as directed. Please refer to the Current Medication list given to you today.   Follow-Up: Please follow up in 2 years with Dr. Veryl Gottron, Slater Duncan, NP or Neomi Banks, NP

## 2023-11-06 NOTE — Progress Notes (Signed)
 Cardiology Office Note:  .   Date:  11/06/2023  ID:  Nicole Allen, DOB June 23, 1951, MRN [id number] PCP: Roosvelt Colla, MD   HeartCare Providers Cardiologist:  Sheryle Donning, MD {  History of Present Illness: .   Nicole Allen is a 73 y.o. female with PMH hypercholesterolemia, FH of heart disease.  Today: Referral from Dr. Monnie Anthony reviewed. Referred for hypercholesterolemia. Notes reviewed. Previously tolerated low dose of simvastatin , but Ca score 0, simvastatin  stopped. LDL went to 206 off statin with tchol 300, so simvastatin  restarted. She is pending repeat Ca score at this time to re-evaluate. Referred to cardiology to discuss cholesterol/risk management.  She reports a FH of heart disease in her father. Had her lp(a) checked, elevated at 191.6. We discussed role of Ca score, how this is meant to be a screening/risk stratification test, not meant to monitor results of treatment on statin, etc. Also discussed statins, concerns for side effects. Reviewed her cholesterol history.  Has OSA, no longer tolerating her CPAP, waiting on re-evaluation.   ROS: Denies chest pain, shortness of breath at rest or with normal exertion. No PND, orthopnea, LE edema or unexpected weight gain. No syncope or palpitations. ROS otherwise negative except as noted.   Studies Reviewed: Aaron Aas    EKG:  EKG Interpretation Date/Time:  Wednesday Nov 06 2023 14:38:52 EDT Ventricular Rate:  92 PR Interval:  136 QRS Duration:  72 QT Interval:  378 QTC Calculation: 467 R Axis:   7  Text Interpretation: Normal sinus rhythm Confirmed by Sheryle Donning [phone]) on 11/06/2023 3:09:16 PM    Physical Exam:   VS:  BP 112/68   Pulse 91   Ht 4\' 11"  (1.499 m)   Wt 112 lb (50.8 kg)   LMP  (LMP Unknown)   SpO2 97%   BMI 22.62 kg/m    Wt Readings from Last 3 Encounters:  11/06/23 112 lb (50.8 kg)  10/30/23 110 lb 6.4 oz (50.1 kg)  07/10/23 112 lb 9.6 oz (51.1 kg)    GEN: Well nourished, well  developed in no acute distress HEENT: Normal, moist mucous membranes NECK: No JVD CARDIAC: regular rhythm, normal S1 and S2, no rubs or gallops. No murmur. VASCULAR: Radial and DP pulses 2+ bilaterally. No carotid bruits RESPIRATORY:  Clear to auscultation without rales, wheezing or rhonchi  ABDOMEN: Soft, non-tender, non-distended MUSCULOSKELETAL:  Ambulates independently SKIN: Warm and dry, no edema NEUROLOGIC:  Alert and oriented x 3. No focal neuro deficits noted. PSYCHIATRIC:  Normal affect    ASSESSMENT AND PLAN: .    Hypercholesterolemia Elevated lp(a) Family history of heart disease -we discussed guidelines and recommendations at length, as above. -discussed lp(a), recommendations for first degree family member screening, no current approved therapies but hope for this in the future -discussed statins. She feels she is tolerating this. Would continue. From my standpoint, would not repeat calcium  score as she is already on treatment.  CV risk counseling and prevention -recommend heart healthy/Mediterranean diet, with whole grains, fruits, vegetable, fish, lean meats, nuts, and olive oil. Limit salt. -recommend moderate walking, 3-5 times/week for 30-50 minutes each session. Aim for at least 150 minutes.week. Goal should be pace of 3 miles/hours, or walking 1.5 miles in 30 minutes -recommend avoidance of tobacco products. Avoid excess alcohol. -ASCVD risk score: The 10-year ASCVD risk score (Arnett DK, et al., 2019) is: 10%   Values used to calculate the score:     Age: 73 years     Sex: Female  Is Non-Hispanic African American: No     Diabetic: No     Tobacco smoker: No     Systolic Blood Pressure: 112 mmHg     Is BP treated: No     HDL Cholesterol: 83 mg/dL     Total Cholesterol: 208 mg/dL    Dispo: 2 years or sooner as needed  Signed, Sheryle Donning, MD   Sheryle Donning, MD, PhD, [facility]  [facility]  [place]  [facility] at  [facility] [address], [address] [place], [place] [address] [phone]

## 2023-11-13 ENCOUNTER — Other Ambulatory Visit: Payer: Self-pay | Admitting: Family Medicine

## 2023-11-13 DIAGNOSIS — E039 Hypothyroidism, unspecified: Secondary | ICD-10-CM

## 2023-12-02 ENCOUNTER — Other Ambulatory Visit: Payer: Self-pay | Admitting: Family Medicine

## 2023-12-02 DIAGNOSIS — J3089 Other allergic rhinitis: Secondary | ICD-10-CM

## 2023-12-03 ENCOUNTER — Ambulatory Visit (HOSPITAL_BASED_OUTPATIENT_CLINIC_OR_DEPARTMENT_OTHER): Attending: Family Medicine

## 2024-01-08 DIAGNOSIS — Z9849 Cataract extraction status, unspecified eye: Secondary | ICD-10-CM | POA: Diagnosis not present

## 2024-01-08 DIAGNOSIS — H53143 Visual discomfort, bilateral: Secondary | ICD-10-CM | POA: Diagnosis not present

## 2024-01-08 DIAGNOSIS — H524 Presbyopia: Secondary | ICD-10-CM | POA: Diagnosis not present

## 2024-01-08 DIAGNOSIS — Z961 Presence of intraocular lens: Secondary | ICD-10-CM | POA: Diagnosis not present

## 2024-01-15 ENCOUNTER — Encounter: Payer: Self-pay | Admitting: Family Medicine

## 2024-01-15 DIAGNOSIS — F988 Other specified behavioral and emotional disorders with onset usually occurring in childhood and adolescence: Secondary | ICD-10-CM

## 2024-01-15 MED ORDER — AMPHETAMINE-DEXTROAMPHET ER 30 MG PO CP24: 30.0000 mg | ORAL_CAPSULE | ORAL | 0 refills | Status: DC

## 2024-01-22 ENCOUNTER — Other Ambulatory Visit: Payer: Self-pay | Admitting: Family Medicine

## 2024-01-22 DIAGNOSIS — K219 Gastro-esophageal reflux disease without esophagitis: Secondary | ICD-10-CM

## 2024-02-18 DIAGNOSIS — G4733 Obstructive sleep apnea (adult) (pediatric): Secondary | ICD-10-CM | POA: Diagnosis not present

## 2024-02-18 DIAGNOSIS — F5104 Psychophysiologic insomnia: Secondary | ICD-10-CM | POA: Diagnosis not present

## 2024-04-17 ENCOUNTER — Encounter: Payer: Self-pay | Admitting: Family Medicine

## 2024-04-17 DIAGNOSIS — F988 Other specified behavioral and emotional disorders with onset usually occurring in childhood and adolescence: Secondary | ICD-10-CM

## 2024-04-17 MED ORDER — AMPHETAMINE-DEXTROAMPHET ER 30 MG PO CP24: 30.0000 mg | ORAL_CAPSULE | ORAL | 0 refills | Status: DC

## 2024-04-21 ENCOUNTER — Encounter: Payer: Self-pay | Admitting: Family Medicine

## 2024-04-21 DIAGNOSIS — K219 Gastro-esophageal reflux disease without esophagitis: Secondary | ICD-10-CM

## 2024-04-22 MED ORDER — OMEPRAZOLE 40 MG PO CPDR: 40.0000 mg | DELAYED_RELEASE_CAPSULE | Freq: Every day | ORAL | 0 refills | Status: DC

## 2024-04-22 NOTE — Telephone Encounter (Signed)
 She picked up #90 of the ADD medication on 10/17. Refill not needed, just letter.  I sent omeprazole  to [facility].  Letter was written and I believe sent to patient.

## 2024-05-19 ENCOUNTER — Encounter: Payer: Self-pay | Admitting: *Deleted

## 2024-05-19 NOTE — Progress Notes (Deleted)
 No chief complaint on file.  Patient presents for 6 month follow-up on chronic problems. She went to [place] and [place] last month,  Hyperlipidemia:  She was tried off simvastatin  in the past, as she was only on very low dose; cholesterol was high when not taking it, went back on 10 mg simvastatin  due to family h/o CAD). She later saw Dr. Vina Gull in October 2017 for consult, who sent her for Ca score CT, which showed Ca score of 0, no evidence of calcified plaque. Review of her recommendations stated that she could cut back on simvastatin  to 5mg , follow lipids, could stop simvastatin . .   LDL was 124 in 09/2020, when on 10mg  simvastatin . We had discussed that with her excellent HDL and calcium  score of 0, that LDL up to 160 would likely be acceptable, so she could do another trial off the statin if she was careful with her dietary cholesterol intake.  LDL off medication was 206, total cholesterol of 300; 10mg  simvastatin  was restarted; LDL was 124 in 10/2022, and 110 in 10/2023, on the 10 mg dose. She had elevated Lp(a) at that time. Last year she had expressed concerns about statin, family history, wanted to discuss again with cardiology (but not Dr. Gull). She saw Dr. Lonni in May, who agreed with her remaining on statins.    Component Ref Range & Units (hover) 6 mo ago (10/28/23) 1 yr ago (10/26/22) 2 yr ago (10/18/21) 3 yr ago (04/25/21) 3 yr ago (10/13/20) 4 yr ago (09/22/19) 5 yr ago (09/16/18)  Cholesterol, Total 208 High  215 High  239 High  300 High  222 High  202 High  227 High   Triglycerides 86 115 102 121 110 107 107  HDL 83 71 74 73 79 70 79  VLDL Cholesterol Cal 15 20 18 21 19 19 21   LDL Chol Calc (NIH) 110 High  124 High  147 High  206 High  124 High  113 High    Chol/HDL Ratio 2.5 3.0 CM 3.2 CM 4.1 CM 2.8 CM 2.9 CM 2.9 CM    Component Ref Range & Units (hover) 6 mo ago  Lipoprotein (a) 191.6 High    Component Ref Range & Units (hover) 6 mo ago  CRP, High  Sensitivity 2.15   She continues to follow a low cholesterol diet.  Doesn't eat red meat, has cheese (3x/wk), avoids mayonnaise, eats eggs once a week.  She continues to limit chocolate and anything with preservatives, as these flare her GI issues.     Hypothyroidism: She is compliant with taking Synthroid  50 mcg daily, doubling up on Sundays. She was adequately replaced on this dose per labs in April. She has chronic issues with feeling tired (related to poor sleep, but reports fatigue even when sleep was better), some hair shedding (not thinning), chronic brittle nails. No GI changes.  Lab Results  Component Value Date   TSH 1.550 10/28/2023      Component Ref Range & Units (hover) 6 mo ago  Free T4 1.66      Component Ref Range & Units (hover) 6 mo ago  T3, Total 100       Anxiety/ADD:  She reports compliance with Adderall 30mg . At her physical 6 months ago she reported that her ADD was poorly controlled. She was not interested in changing meds or seeing psychiatrist. She wanted to look into finding a therapist who is knowledgeable about adult ADD.   At one point saw psychiatrist (  Dr. Lucyann) and was treated with Pristiq-- helped her anxiety, but worsened her depression. She has remained off medications for anxiety/depression, and moods are up and down.    She denies desiring any change in medication today. ***   Fatigue, OSA, insomnia.  She had been under the care of Dr. Jess, but she left the practice. She had done very well with the regimen per Dr. Lizbeth a specifically recommended melatonin 2-3 hours before bedtime, and waking at 8:30 every day, regardless of when she got to sleep. She hadn't needed any Lunesta , had been falling asleep fine most of the time.  At 04/2023 visit she mentioned she heard there was nationwide shortage of the specific melatonin that was recommended by Dr. Jess. She had switched to a different melatonin (different method of production  from [employer]), and reported at her physical that it wasn't helpful, was sleeping terribly.  She hadn't been using her CPAP due to complaints of nasal congestion. She was encouraged her to f/u with [facility].   She had reported having a sleep study through Dr. Jess that only had 4 hours of sleep.  Plan was to repeat the study when her sleep had improved (never done). ***?? She last saw Dr. Harl at [place] in August, no records were received.  Today she reports ***    Allergies:  Using Flonase  and zyrtec every night. Periodically uses  mometasone  and then switches back.   GERD: She continues to take omeprazole  40mg  before dinner, and pepcid  in the morning, per Dr. Kristie (after having flares of GERD not adequately controlled with omeprazole  alone). This regimen, along with limiting preservatives and chocolate, has been effective.  Denies dysphagia.   Osteopenia:  Last DEXA was in 10/2022, T-2.4 L fem neck (stable, slight improvement noted).  She continues on monthly Boniva  and denies side effects. She held Boniva  treatment from 07/2023 through 12/2023 related to dental work.  ***VERIFY/UPDATE  Carries heavy things with gardening, no other weight-bearing exercise.  At her physical she mentioned planning to start working with a trainer ***UPDATE      PMH, PSH, SH reviewed   ROS: no fever, chills, URI symptoms. No headaches, dizziness. No chest pain, palpitations, nausea, vomiting. Intermittent GI issues, chronic/unchanged. No changes to hair/skin/nails--some ongoing shedding and chronically brittle nails. Fatigue, insomnia and anxiety per HPI. R knee stiffness (h/o ACL tear)  ***UPDATE   PHYSICAL EXAM:  LMP  (LMP Unknown)   Wt Readings from Last 3 Encounters:  11/06/23 112 lb (50.8 kg)  10/30/23 110 lb 6.4 oz (50.1 kg)  07/10/23 112 lb 9.6 oz (51.1 kg)    Pleasant female, in no distress HEENT: conjunctiva and sclera are clear, EOMI.  Neck: no lymphadenopathy,  thyromegaly or mass Heart: regular rate and rhythm Lungs: clear bilaterally Abdomen: soft, nontender, no mass Back: no spinal or CVA tenderness Extremities no edema Psych: normal mood, affect, eye contact, speech, hygiene and grooming Neuro: alert and oriented, cranial nerves intact, normal gait.     ASSESSMENT/PLAN:   Gad7/phq9 only if reports moods/anxiety issues and not seeing psych (hasn't been)  TSH only if symptoms (was good in April) No other labs needed  Adderall shouldn't be needed--90d sent 10/17  We never got notes from [facility] (Dr. Harl) 01/2024. Did she ever have another sleep study?  See anyone else there since he left?  Colonoscopy is past due (was due in 2023)--did she ever contact Dr. Kristie? Last mammo we have was from 10/2022--did she  have one since? If so, get report, if not, remind to schedule.

## 2024-05-20 ENCOUNTER — Encounter: Payer: Self-pay | Admitting: Family Medicine

## 2024-05-20 DIAGNOSIS — E039 Hypothyroidism, unspecified: Secondary | ICD-10-CM

## 2024-05-20 DIAGNOSIS — M85851 Other specified disorders of bone density and structure, right thigh: Secondary | ICD-10-CM

## 2024-05-20 DIAGNOSIS — K219 Gastro-esophageal reflux disease without esophagitis: Secondary | ICD-10-CM

## 2024-05-20 DIAGNOSIS — F988 Other specified behavioral and emotional disorders with onset usually occurring in childhood and adolescence: Secondary | ICD-10-CM

## 2024-05-20 DIAGNOSIS — F411 Generalized anxiety disorder: Secondary | ICD-10-CM

## 2024-05-20 DIAGNOSIS — E7800 Pure hypercholesterolemia, unspecified: Secondary | ICD-10-CM

## 2024-05-20 DIAGNOSIS — G4733 Obstructive sleep apnea (adult) (pediatric): Secondary | ICD-10-CM

## 2024-06-09 ENCOUNTER — Other Ambulatory Visit: Payer: Self-pay | Admitting: Family Medicine

## 2024-06-09 DIAGNOSIS — G4733 Obstructive sleep apnea (adult) (pediatric): Secondary | ICD-10-CM | POA: Diagnosis not present

## 2024-06-09 DIAGNOSIS — E7800 Pure hypercholesterolemia, unspecified: Secondary | ICD-10-CM

## 2024-06-09 DIAGNOSIS — G4700 Insomnia, unspecified: Secondary | ICD-10-CM | POA: Diagnosis not present

## 2024-07-10 ENCOUNTER — Other Ambulatory Visit: Payer: Self-pay | Admitting: Family Medicine

## 2024-07-10 DIAGNOSIS — E039 Hypothyroidism, unspecified: Secondary | ICD-10-CM

## 2024-07-30 ENCOUNTER — Other Ambulatory Visit: Payer: Self-pay | Admitting: Family Medicine

## 2024-07-30 ENCOUNTER — Telehealth: Payer: Self-pay | Admitting: *Deleted

## 2024-07-30 DIAGNOSIS — K219 Gastro-esophageal reflux disease without esophagitis: Secondary | ICD-10-CM

## 2024-07-30 DIAGNOSIS — F988 Other specified behavioral and emotional disorders with onset usually occurring in childhood and adolescence: Secondary | ICD-10-CM

## 2024-07-30 MED ORDER — AMPHETAMINE-DEXTROAMPHET ER 30 MG PO CP24: 30.0000 mg | ORAL_CAPSULE | ORAL | 0 refills | Status: AC

## 2024-07-30 NOTE — Telephone Encounter (Signed)
 Copied from CRM #[id number]. Topic: Clinical - Medication Refill >> Jul 30, 2024 12:48 PM Rachelle R wrote: Medication: amphetamine -dextroamphetamine (ADDERALL XR) 30 MG 24 hr capsule  Has the patient contacted their pharmacy? Yes  This is the patient's preferred pharmacy:  [facility] [place], [place] - [address] [facility] [address] [place] [address] Phone: [phone] Fax: [phone]  Is this the correct pharmacy for this prescription? Yes  Has the prescription been filled recently? No  Is the patient out of the medication? Yes  Has the patient been seen for an appointment in the last year OR does the patient have an upcoming appointment? Yes  Can we respond through MyChart? No  Agent: Please be advised that Rx refills may take up to 3 business days. We ask that you follow-up with your pharmacy.  Forwarding

## 2024-11-05 ENCOUNTER — Ambulatory Visit: Payer: Self-pay | Admitting: Family Medicine
